# Patient Record
Sex: Male | Born: 1935 | Race: White | Hispanic: No | Marital: Married | Smoking: Never smoker
Health system: Southern US, Community
[De-identification: ages and names within clinical notes are randomized; demographics above are authoritative.]

## PROBLEM LIST (undated history)

## (undated) DIAGNOSIS — F329 Major depressive disorder, single episode, unspecified: Secondary | ICD-10-CM

## (undated) DIAGNOSIS — I959 Hypotension, unspecified: Secondary | ICD-10-CM

## (undated) DIAGNOSIS — M6281 Muscle weakness (generalized): Secondary | ICD-10-CM

## (undated) DIAGNOSIS — N183 Chronic kidney disease, stage 3 unspecified: Secondary | ICD-10-CM

## (undated) DIAGNOSIS — K859 Acute pancreatitis without necrosis or infection, unspecified: Secondary | ICD-10-CM

## (undated) DIAGNOSIS — I4891 Unspecified atrial fibrillation: Secondary | ICD-10-CM

## (undated) DIAGNOSIS — F32A Depression, unspecified: Secondary | ICD-10-CM

## (undated) DIAGNOSIS — F0390 Unspecified dementia without behavioral disturbance: Secondary | ICD-10-CM

## (undated) HISTORY — PX: ANKLE FUSION: SHX881

## (undated) HISTORY — DX: Unspecified atrial fibrillation: I48.91

## (undated) HISTORY — PX: APPENDECTOMY: SHX54

---
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
## 1998-01-31 ENCOUNTER — Ambulatory Visit (HOSPITAL_COMMUNITY): Admission: RE | Admit: 1998-01-31 | Discharge: 1998-01-31 | Payer: Self-pay | Admitting: Gastroenterology

## 2000-08-17 HISTORY — PX: KNEE ARTHROSCOPY: SHX127

## 2001-04-04 ENCOUNTER — Ambulatory Visit (HOSPITAL_COMMUNITY): Admission: RE | Admit: 2001-04-04 | Discharge: 2001-04-04 | Payer: Self-pay | Admitting: Gastroenterology

## 2002-10-06 ENCOUNTER — Encounter: Payer: Self-pay | Admitting: Orthopedic Surgery

## 2002-10-11 ENCOUNTER — Encounter: Payer: Self-pay | Admitting: Orthopedic Surgery

## 2002-10-11 ENCOUNTER — Inpatient Hospital Stay (HOSPITAL_COMMUNITY): Admission: RE | Admit: 2002-10-11 | Discharge: 2002-10-16 | Payer: Self-pay | Admitting: Orthopedic Surgery

## 2005-03-05 ENCOUNTER — Encounter: Admission: RE | Admit: 2005-03-05 | Discharge: 2005-03-05 | Payer: Self-pay | Admitting: Orthopedic Surgery

## 2005-03-12 ENCOUNTER — Ambulatory Visit (HOSPITAL_COMMUNITY): Admission: RE | Admit: 2005-03-12 | Discharge: 2005-03-12 | Payer: Self-pay | Admitting: Orthopedic Surgery

## 2005-03-12 ENCOUNTER — Ambulatory Visit (HOSPITAL_BASED_OUTPATIENT_CLINIC_OR_DEPARTMENT_OTHER): Admission: RE | Admit: 2005-03-12 | Discharge: 2005-03-12 | Payer: Self-pay | Admitting: Orthopedic Surgery

## 2007-08-18 HISTORY — PX: TOTAL KNEE ARTHROPLASTY: SHX125

## 2011-06-21 ENCOUNTER — Emergency Department (HOSPITAL_BASED_OUTPATIENT_CLINIC_OR_DEPARTMENT_OTHER)
Admission: EM | Admit: 2011-06-21 | Discharge: 2011-06-21 | Disposition: A | Discharge status: Home / Self Care | Payer: Medicare Other | Attending: Emergency Medicine | Admitting: Emergency Medicine

## 2011-06-21 ENCOUNTER — Encounter: Payer: Self-pay | Admitting: Emergency Medicine

## 2011-06-21 ENCOUNTER — Emergency Department (INDEPENDENT_AMBULATORY_CARE_PROVIDER_SITE_OTHER): Payer: Medicare Other

## 2011-06-21 DIAGNOSIS — S20219A Contusion of unspecified front wall of thorax, initial encounter: Secondary | ICD-10-CM | POA: Insufficient documentation

## 2011-06-21 DIAGNOSIS — X58XXXA Exposure to other specified factors, initial encounter: Secondary | ICD-10-CM | POA: Insufficient documentation

## 2011-06-21 DIAGNOSIS — R079 Chest pain, unspecified: Secondary | ICD-10-CM | POA: Insufficient documentation

## 2011-06-21 DIAGNOSIS — R0789 Other chest pain: Secondary | ICD-10-CM

## 2011-06-21 NOTE — ED Provider Notes (Signed)
History     CSN: [id number] Arrival date & time: 06/21/2011 12:09 PM   None     Chief Complaint  Patient presents with  . Rib Injury    (Consider location/radiation/quality/duration/timing/severity/associated sxs/prior treatment) HPI Comments: Patient was leaning into a tub to repair the drain 2 days ago. As he pushed himself out of the tub, he heard something pop in his right anterior chest. Since then he has noted pain in the right anterior chest. Worse with a deep breath or when lying down. He has had no prior evaluation for this. He took no medication for this.  Patient is a 77 y.o. male presenting with chest pain.  Chest Pain The chest pain began 2 days ago. Chest pain occurs intermittently. The chest pain is unchanged. The pain is associated with breathing. At its most intense, the pain is at 5/10. The pain is currently at 5/10. The severity of the pain is moderate. The quality of the pain is described as sharp. The pain does not radiate. Chest pain is worsened by deep breathing. He tried nothing for the symptoms. Risk factors include male gender and being elderly.     History reviewed. No pertinent past medical history.  Past Surgical History  Procedure Date  . Joint replacement     knee    History reviewed. No pertinent family history.  History  Substance Use Topics  . Smoking status: Never Smoker   . Smokeless tobacco: Not on file  . Alcohol Use: 1.2 oz/week    2 Shots of liquor per week      Review of Systems  Constitutional: Negative.   HENT: Negative.   Eyes: Negative.   Respiratory: Negative.   Cardiovascular: Positive for chest pain.  Gastrointestinal: Negative.   Genitourinary: Negative.   Musculoskeletal: Negative.   Skin: Negative.   Neurological: Negative.   Psychiatric/Behavioral: Negative.     Allergies  Review of patient's allergies indicates no known allergies.  Home Medications   Current Outpatient Rx  Name Route Sig Dispense Refill   . MULTIVITAMINS PO CAPS Oral Take 1 capsule by mouth daily.        BP 128/72  Pulse 52  Temp(Src) 97.5 F (36.4 C) (Oral)  Resp 18  Ht 6' (1.829 m)  Wt 205 lb (92.987 kg)  BMI 27.80 kg/m2  SpO2 100%  Physical Exam  Nursing note and vitals reviewed. Constitutional: He is oriented to person, place, and time. He appears well-developed and well-nourished.       In mild-moderate distress with pain in the right anterior rib cage.  HENT:  Head: Normocephalic and atraumatic.  Eyes: EOM are normal. Pupils are equal, round, and reactive to light.  Neck: Normal range of motion. Neck supple.  Cardiovascular: Normal rate, regular rhythm and normal heart sounds.   Pulmonary/Chest: Effort normal and breath sounds normal. He exhibits tenderness.       He localizes pain to the right anterior chest wall at the costal margin.  There is no crepitance or subcutaneous emphysema.  Abdominal: Soft. Bowel sounds are normal. There is no tenderness.  Musculoskeletal: Normal range of motion.  Neurological: He is alert and oriented to person, place, and time.       No sensory or motor deficit.  Skin: Skin is warm and dry.  Psychiatric: He has a normal mood and affect. His behavior is normal.    ED Course  Procedures (including critical care time)  2:01 PM Pt seen -->> physical exam performed.  X-rays of chest and right ribs ordered. 2:01 PM X-rays were negative.  He did not want any prescription pain medicine.  Reassured and released.  1. Chest wall contusion        Carleene Cooper III, MD 06/21/11 1401

## 2011-06-21 NOTE — ED Notes (Signed)
Right rib pain since Friday.  Pt slipped an caught his right rib in tub.  No head injury or head injury.

## 2011-06-21 NOTE — ED Provider Notes (Signed)
History     CSN: [id number] Arrival date & time: 06/21/2011 12:09 PM   None     Chief Complaint  Patient presents with  . Rib Injury    (Consider location/radiation/quality/duration/timing/severity/associated sxs/prior treatment) Patient is a 77 y.o. male presenting with chest pain. The history is provided by the patient. No language interpreter was used.  Chest Pain The chest pain began 2 days ago. Duration of episode(s) is 2 days. Chest pain occurs constantly. The chest pain is unchanged. At its most intense, the pain is at 6/10. The pain is currently at 6/10. The quality of the pain is described as aching. The pain does not radiate. Chest pain is worsened by deep breathing. Pertinent negatives for primary symptoms include no cough. He tried nothing for the symptoms. Risk factors include no known risk factors.   Pt fell in tub and hit right ribs  History reviewed. No pertinent past medical history.  Past Surgical History  Procedure Date  . Joint replacement     knee    History reviewed. No pertinent family history.  History  Substance Use Topics  . Smoking status: Never Smoker   . Smokeless tobacco: Not on file  . Alcohol Use: 1.2 oz/week    2 Shots of liquor per week      Review of Systems  Respiratory: Negative for cough.   Cardiovascular: Positive for chest pain.    Allergies  Review of patient's allergies indicates no known allergies.  Home Medications   Current Outpatient Rx  Name Route Sig Dispense Refill  . MULTIVITAMINS PO CAPS Oral Take 1 capsule by mouth daily.        BP 128/72  Pulse 52  Temp(Src) 97.5 F (36.4 C) (Oral)  Resp 18  Ht 6' (1.829 m)  Wt 205 lb (92.987 kg)  BMI 27.80 kg/m2  SpO2 100%  Physical Exam  Nursing note and vitals reviewed. Constitutional: He is oriented to person, place, and time. He appears well-developed and well-nourished.  HENT:  Head: Normocephalic and atraumatic.  Right Ear: External ear normal.  Left  Ear: External ear normal.  Nose: Nose normal.  Mouth/Throat: Oropharynx is clear and moist.  Eyes: Pupils are equal, round, and reactive to light.  Neck: Normal range of motion.  Cardiovascular: Normal rate and normal heart sounds.   Abdominal: Soft. There is no tenderness.  Musculoskeletal: Normal range of motion.  Neurological: He is alert and oriented to person, place, and time.  Skin: Skin is warm.  Psychiatric: He has a normal mood and affect.    ED Course  Procedures (including critical care time)  Labs Reviewed - No data to display No results found. Attending note: Pt hurt his chest wall two days ago after he was cleaning a tub drain and was pushing himself up over the side of the tub.  Exam shows no point tenderness, crepitance or subcutaneous emphysema.  Lungs are clear.  X-rays of chest and ribs  Are negative. Carleene Cooper III, M.D.   1. Chest wall contusion       MDM  Pt counseled on results  Medical screening examination/treatment/procedure(s) were conducted as a shared visit with non-physician practitioner(s) and myself.  I personally evaluated the patient during the encounter Carleene Cooper III M.Haskell Riling, PA 06/24/11 1512  Carleene Cooper III, MD 06/24/11 2055

## 2012-03-19 ENCOUNTER — Encounter (HOSPITAL_BASED_OUTPATIENT_CLINIC_OR_DEPARTMENT_OTHER): Payer: Self-pay | Admitting: Emergency Medicine

## 2012-03-19 ENCOUNTER — Emergency Department (HOSPITAL_BASED_OUTPATIENT_CLINIC_OR_DEPARTMENT_OTHER): Payer: Medicare Other

## 2012-03-19 ENCOUNTER — Inpatient Hospital Stay (HOSPITAL_BASED_OUTPATIENT_CLINIC_OR_DEPARTMENT_OTHER)
Admission: EM | Admit: 2012-03-19 | Discharge: 2012-03-21 | DRG: 439 | Disposition: A | Discharge status: Home / Self Care | Payer: Medicare Other | Attending: Internal Medicine | Admitting: Internal Medicine

## 2012-03-19 DIAGNOSIS — R001 Bradycardia, unspecified: Secondary | ICD-10-CM

## 2012-03-19 DIAGNOSIS — Z7982 Long term (current) use of aspirin: Secondary | ICD-10-CM

## 2012-03-19 DIAGNOSIS — I10 Essential (primary) hypertension: Secondary | ICD-10-CM | POA: Diagnosis present

## 2012-03-19 DIAGNOSIS — K5289 Other specified noninfective gastroenteritis and colitis: Secondary | ICD-10-CM | POA: Diagnosis present

## 2012-03-19 DIAGNOSIS — K529 Noninfective gastroenteritis and colitis, unspecified: Secondary | ICD-10-CM

## 2012-03-19 DIAGNOSIS — Z79899 Other long term (current) drug therapy: Secondary | ICD-10-CM

## 2012-03-19 DIAGNOSIS — I498 Other specified cardiac arrhythmias: Secondary | ICD-10-CM | POA: Diagnosis present

## 2012-03-19 DIAGNOSIS — D72829 Elevated white blood cell count, unspecified: Secondary | ICD-10-CM

## 2012-03-19 DIAGNOSIS — K859 Acute pancreatitis without necrosis or infection, unspecified: Principal | ICD-10-CM | POA: Diagnosis present

## 2012-03-19 LAB — CBC WITH DIFFERENTIAL/PLATELET
Basophils Relative: 0 % (ref 0–1)
Eosinophils Relative: 0 % (ref 0–5)
HCT: 40.7 % (ref 39.0–52.0)
Hemoglobin: 13.9 g/dL (ref 13.0–17.0)
Lymphocytes Relative: 4 % — ABNORMAL LOW (ref 12–46)
MCHC: 34.2 g/dL (ref 30.0–36.0)
MCV: 92.9 fL (ref 78.0–100.0)
Monocytes Absolute: 0.5 10*3/uL (ref 0.1–1.0)
Monocytes Relative: 4 % (ref 3–12)
Neutro Abs: 12.3 10*3/uL — ABNORMAL HIGH (ref 1.7–7.7)

## 2012-03-19 LAB — TROPONIN I: Troponin I: <0.3 ng/mL (ref <0.30)

## 2012-03-19 LAB — COMPREHENSIVE METABOLIC PANEL
ALT: 13 U/L (ref 0–53)
AST: 27 U/L (ref 0–37)
Albumin: 4.1 g/dL (ref 3.5–5.2)
Alkaline Phosphatase: 92 U/L (ref 39–117)
Calcium: 9.9 mg/dL (ref 8.4–10.5)
GFR calc Af Amer: 73 mL/min — ABNORMAL LOW (ref >90)
Glucose, Bld: 156 mg/dL — ABNORMAL HIGH (ref 70–99)
Potassium: 4.2 mEq/L (ref 3.5–5.1)
Sodium: 142 mEq/L (ref 135–145)
Total Protein: 7.2 g/dL (ref 6.0–8.3)

## 2012-03-19 LAB — URINALYSIS, ROUTINE W REFLEX MICROSCOPIC
Bilirubin Urine: NEGATIVE
Hgb urine dipstick: NEGATIVE
Ketones, ur: NEGATIVE mg/dL
Specific Gravity, Urine: 1.023 (ref 1.005–1.030)
pH: 6 (ref 5.0–8.0)

## 2012-03-19 LAB — URINE MICROSCOPIC-ADD ON

## 2012-03-19 MED ORDER — SODIUM CHLORIDE 0.9 % IV SOLN
INTRAVENOUS | Status: AC
  Administered 2012-03-19: 21:00:00 via INTRAVENOUS

## 2012-03-19 MED ORDER — HYDROMORPHONE HCL PF 1 MG/ML IJ SOLN
1.0000 mg | Freq: Once | INTRAMUSCULAR | Status: AC
  Administered 2012-03-19: 1 mg via INTRAVENOUS
  Filled 2012-03-19: qty 1

## 2012-03-19 MED ORDER — ONDANSETRON HCL 4 MG/2ML IJ SOLN
4.0000 mg | Freq: Once | INTRAMUSCULAR | Status: AC
  Administered 2012-03-19: 4 mg via INTRAVENOUS
  Filled 2012-03-19: qty 2

## 2012-03-19 MED ORDER — IOHEXOL 300 MG/ML  SOLN
20.0000 mL | INTRAMUSCULAR | Status: AC
  Administered 2012-03-19: 20 mL via ORAL

## 2012-03-19 MED ORDER — IOHEXOL 300 MG/ML  SOLN
100.0000 mL | Freq: Once | INTRAMUSCULAR | Status: AC | PRN
  Administered 2012-03-19: 100 mL via INTRAVENOUS

## 2012-03-19 MED ORDER — HYDROMORPHONE HCL PF 1 MG/ML IJ SOLN: 1.0000 mg | INTRAMUSCULAR | Status: DC | PRN

## 2012-03-19 MED ORDER — ONDANSETRON HCL 4 MG/2ML IJ SOLN: 4.0000 mg | Freq: Three times a day (TID) | INTRAMUSCULAR | Status: DC | PRN

## 2012-03-19 NOTE — ED Notes (Signed)
Pt vomited after drinking contrast- CT & MD notified

## 2012-03-19 NOTE — ED Provider Notes (Signed)
History   This chart was scribed for Hilario Quarry, MD scribed by Magnus Sinning. The patient was seen in room MH11/MH11 seen at 15:10    CSN: [id number]  Arrival date & time 03/19/12  1427   First MD Initiated Contact with Patient 03/19/12 1508      Chief Complaint  Patient presents with  . Abdominal Pain  . Bradycardia    (Consider location/radiation/quality/duration/timing/severity/associated sxs/prior treatment) HPI Cole Wilson is a 78 y.o. male BIB EMS who presents to the Emergency Department complaining of gradually worsening moderate dull abd pain, onset this morning after eating sausage biscuits for breakfast. Patient states the abd pain is located under his rib cage and across his entire abd. Patient also reports that the pain began approximately 3 hours after eating breakfast and he says he has not eaten since this morning. Denies emesis, urinary sxs, changes in bowel movements, diarrhea, nausea, back pain, use of tobacco, hx of similar abd pain, or hx of cholecystectomy. He currently rates pain a 8/10 with EDMD palpation.  Says he's currently taking an abx for his toe nail that was removed, but says he is otherwise healthy. Patient also takes vitamins daily and does state he does occasionally consume about 3 alcoholic beverages weekly.   PCP: Dr. Lupe Carney Podiatrist: [facility]  History reviewed. No pertinent past medical history.  Past Surgical History  Procedure Date  . Joint replacement     knee    No family history on file.  History  Substance Use Topics  . Smoking status: Never Smoker   . Smokeless tobacco: Not on file  . Alcohol Use: 1.2 oz/week    2 Shots of liquor per week     Review of Systems  All other systems reviewed and are negative.   10 Systems reviewed and are negative for acute change except as noted in the HPI. Allergies  Review of patient's allergies indicates no known allergies.  Home Medications   Current  Outpatient Rx  Name Route Sig Dispense Refill  . MULTIVITAMINS PO CAPS Oral Take 1 capsule by mouth daily.        BP 143/69  Resp 16  SpO2 99%  Physical Exam  Nursing note and vitals reviewed. Constitutional: He is oriented to person, place, and time. He appears well-developed and well-nourished. No distress.  HENT:  Head: Normocephalic and atraumatic.  Eyes: Conjunctivae and EOM are normal.  Neck: Neck supple. No tracheal deviation present.  Cardiovascular: Normal rate, regular rhythm and normal heart sounds.   No murmur heard. Pulmonary/Chest: Effort normal. No respiratory distress. He has no wheezes. He has no rales.  Abdominal: Soft. Bowel sounds are normal. He exhibits no distension. There is tenderness.       Diffusely tender across bilateral quadrants and at epigastrium.  Musculoskeletal: Normal range of motion. He exhibits no edema.       2+ DP pulses in both left and right foot. Well healing left great toe  Neurological: He is alert and oriented to person, place, and time. No sensory deficit.  Skin: Skin is warm and dry.  Psychiatric: He has a normal mood and affect. His behavior is normal.    ED Course  Procedures (including critical care time) DIAGNOSTIC STUDIES: Oxygen Saturation is 99% on room air, normal by my interpretation.    COORDINATION OF CARE: Differential Diagnoses: Gall bladder, Gastritis, AAA,  and other abd prognoses.  15:19: EDMD provides intent to start with labs and ultra sound.  Labs Reviewed  COMPREHENSIVE METABOLIC PANEL - Abnormal; Notable for the following:    Glucose, Bld 156 (*)     GFR calc non Af Amer 63 (*)     GFR calc Af Amer 73 (*)     All other components within normal limits  CBC WITH DIFFERENTIAL - Abnormal; Notable for the following:    WBC 13.2 (*)     Neutrophils Relative 93 (*)     Neutro Abs 12.3 (*)     Lymphocytes Relative 4 (*)     Lymphs Abs 0.5 (*)     All other components within normal limits  URINALYSIS,  ROUTINE W REFLEX MICROSCOPIC - Abnormal; Notable for the following:    Protein, ur 30 (*)     All other components within normal limits  LIPASE, BLOOD - Abnormal; Notable for the following:    Lipase >3000 (*)     All other components within normal limits  URINE MICROSCOPIC-ADD ON - Abnormal; Notable for the following:    Squamous Epithelial / LPF FEW (*)     Bacteria, UA FEW (*)     Casts HYALINE CASTS (*)     All other components within normal limits  TROPONIN I   US Abdomen Complete  03/19/2012  *RADIOLOGY REPORT*  Clinical Data:  Right upper quadrant abdominal pain.  COMPLETE ABDOMINAL ULTRASOUND  Comparison:  None.  Findings:  Gallbladder:  No shadowing gallstones or echogenic sludge.  No gallbladder wall thickening or pericholecystic fluid.  No sonographic Murphy's sign according to the ultrasound technologist. Wall thickness is 3.2 mm, within normal limits.  Common bile duct:  Normal in caliber. No biliary ductal dilation. The maximal diameter is 5.7 mm, within normal limits.  Liver:  Poor definition of internal architecture suggests diffuse fatty infiltration.  No discrete lesions are evident.  IVC:  Appears normal.  Pancreas:  The pancreas is not well visualized secondary to body habitus.  Spleen:  Normal size and echotexture without focal parenchymal abnormality.  The maximal size is 8.8 cm, within normal limits.  Right Kidney:  No hydronephrosis.  Well-preserved cortex.  Normal size and parenchymal echotexture without focal abnormalities. The maximal length is 11.7 cm, within normal limits.  Left Kidney:  No hydronephrosis.  Well-preserved cortex.  Normal size and parenchymal echotexture without focal abnormalities. The maximal length is 11.4 cm, within normal limit.  Abdominal aorta:  The portions of the aorta are not well visualized due to patient body habitus.  IMPRESSION:  1.  Normal appearance of the gallbladder and common bile duct. 2.  Hepatic steatosis. 3.  No acute abnormality to  explain the patient's symptoms.  Original Report Authenticated By: Jamesetta Orleans. MATTERN, M.D.   US Abdomen Complete  Ct Abdomen Pelvis W Contrast  03/19/2012  *RADIOLOGY REPORT*  Clinical Data: 78 year old male with abdominal pain and nausea.  CT ABDOMEN AND PELVIS WITH CONTRAST  Technique:  Multidetector CT imaging of the abdomen and pelvis was performed following the standard protocol during bolus administration of intravenous contrast.  Contrast:  100 ml intravenous Omnipaque-300  Comparison: 03/19/2012 abdominal ultrasound.  Findings: The liver, spleen, pancreas, gallbladder and adrenal glands are unremarkable. Mild bilateral renal cortical atrophy is present.  There is mild stranding/inflammation within the mesentery and pericolic gutters - of uncertain chronicity.  A tiny amount of ascites is present.  The source of this stranding/inflammation is not identified.  There is no evidence of biliary dilatation, abdominal aortic aneurysm, enlarged lymph nodes or pneumoperitoneum. There is  no evidence of small bowel obstruction or bowel wall thickening.  Descending and sigmoid colonic diverticulosis noted without evidence of diverticulitis. The appendix is normal.  Prostate enlargement is present as well as a small left inguinal hernia containing fat. Bilateral common iliac artery aneurysms are present measuring 1.6 cm on the right 1.8 cm on the left.  No acute or suspicious bony abnormalities identified.  IMPRESSION: Mild stranding/inflammation in the mesentery and pericolic gutters of uncertain chronicity and uncertain origin.  Tiny amount of ascites.  Unremarkable bowel except for colonic diverticulosis.  Bilateral common iliac artery aneurysms.  Prostate enlargement.  Original Report Authenticated By: Rosendo Gros, M.D.   No diagnosis found.     Date: 03/19/2012  Rate:49  Rhythm: sinus bradycardia  QRS Axis: normal  Intervals: normal  ST/T Wave abnormalities: normal  Conduction Disutrbances:  none  Narrative Interpretation: unremarkable     MDM    Discussed  Abdomen ultrasound and plan for ct and and inpatient treatment.  Pain greatly improved after pain meds.   Discussed ct results with Dr. Si Gaul and feels c.w. Pancreatitis.    Hospitalist paged for admission.   Discussed with Dr. Mikeal Hawthorne and plan tele admission for pancreatitis and bradycardia.  HR has been normal since initial evaluation.    Hilario Quarry, MD 03/19/12 2041

## 2012-03-19 NOTE — ED Notes (Signed)
Ray MD at bedside to discuss admission with pt and family. Pt's wife is going home. Cole Wilson (wife) cell: [phone]

## 2012-03-19 NOTE — ED Notes (Addendum)
Pt c/o mid upper abd pain, started after eating a sausage biscuit this am, has progressively moved upward to epigastric area. Nausea, no vomiting; per EMS pt was diaphoretic upon their arrival (they report he was outside) & HR dropped to "30s" for a brief period.

## 2012-03-19 NOTE — ED Notes (Signed)
Phone report given to [facility]- attempt x 1 to call report to [phone]

## 2012-03-20 ENCOUNTER — Encounter (HOSPITAL_COMMUNITY): Payer: Self-pay | Admitting: Internal Medicine

## 2012-03-20 DIAGNOSIS — D72829 Elevated white blood cell count, unspecified: Secondary | ICD-10-CM

## 2012-03-20 LAB — COMPREHENSIVE METABOLIC PANEL
AST: 20 U/L (ref 0–37)
Albumin: 3.4 g/dL — ABNORMAL LOW (ref 3.5–5.2)
BUN: 18 mg/dL (ref 6–23)
Calcium: 9 mg/dL (ref 8.4–10.5)
Creatinine, Ser: 1.01 mg/dL (ref 0.50–1.35)
Total Protein: 6.5 g/dL (ref 6.0–8.3)

## 2012-03-20 LAB — CBC
HCT: 40.2 % (ref 39.0–52.0)
Hemoglobin: 13.4 g/dL (ref 13.0–17.0)
MCV: 93.7 fL (ref 78.0–100.0)
RBC: 4.29 MIL/uL (ref 4.22–5.81)
RDW: 13.6 % (ref 11.5–15.5)
WBC: 12.6 10*3/uL — ABNORMAL HIGH (ref 4.0–10.5)

## 2012-03-20 LAB — LIPID PANEL
Cholesterol: 175 mg/dL (ref 0–200)
Triglycerides: 46 mg/dL (ref <150)

## 2012-03-20 MED ORDER — HYDROMORPHONE HCL PF 1 MG/ML IJ SOLN: 0.5000 mg | INTRAMUSCULAR | Status: DC | PRN

## 2012-03-20 MED ORDER — ONDANSETRON HCL 4 MG/2ML IJ SOLN: 4.0000 mg | Freq: Four times a day (QID) | INTRAMUSCULAR | Status: DC | PRN

## 2012-03-20 MED ORDER — SODIUM CHLORIDE 0.9 % IV SOLN
INTRAVENOUS | Status: DC
  Administered 2012-03-20 (×3): via INTRAVENOUS

## 2012-03-20 MED ORDER — ENOXAPARIN SODIUM 40 MG/0.4ML ~~LOC~~ SOLN
40.0000 mg | SUBCUTANEOUS | Status: DC
  Administered 2012-03-20: 40 mg via SUBCUTANEOUS
  Filled 2012-03-20 (×2): qty 0.4

## 2012-03-20 MED ORDER — ZOLPIDEM TARTRATE 5 MG PO TABS: 5.0000 mg | ORAL_TABLET | Freq: Every evening | ORAL | Status: DC | PRN

## 2012-03-20 MED ORDER — ONDANSETRON HCL 4 MG PO TABS: 4.0000 mg | ORAL_TABLET | Freq: Four times a day (QID) | ORAL | Status: DC | PRN

## 2012-03-20 MED ORDER — LABETALOL HCL 5 MG/ML IV SOLN
5.0000 mg | INTRAVENOUS | Status: DC | PRN
  Filled 2012-03-20: qty 4

## 2012-03-20 MED ORDER — LABETALOL HCL 100 MG PO TABS
100.0000 mg | ORAL_TABLET | Freq: Two times a day (BID) | ORAL | Status: DC
  Administered 2012-03-20: 100 mg via ORAL
  Filled 2012-03-20 (×4): qty 1

## 2012-03-20 MED ORDER — CIPROFLOXACIN IN D5W 400 MG/200ML IV SOLN
400.0000 mg | Freq: Two times a day (BID) | INTRAVENOUS | Status: DC
  Administered 2012-03-20 – 2012-03-21 (×3): 400 mg via INTRAVENOUS
  Filled 2012-03-20 (×4): qty 200

## 2012-03-20 MED ORDER — METRONIDAZOLE IN NACL 5-0.79 MG/ML-% IV SOLN
500.0000 mg | Freq: Three times a day (TID) | INTRAVENOUS | Status: DC
  Administered 2012-03-20 – 2012-03-21 (×4): 500 mg via INTRAVENOUS
  Filled 2012-03-20 (×6): qty 100

## 2012-03-20 NOTE — H&P (Signed)
Cole Wilson is an 78 y.o. male.   Chief Complaint: Abdominal pain HPI: 78 year old gentleman with no significant past medical history except from possible cholesterol who went to [facility] tonight secondary to epigastric pain and bloating. Pain was 8/10 it side but now 1/10. Problem started a day earlier. His wife has been sick for a few weeks since they took a trip to the mountains. He has not been sick however until now. His initial evaluation treatment center hypertension old elevated lipase more than 3000 some leukocytosis and mild bradycardia with heart rates at some point drop into the 40s but currently stable at 60s. His CT abdomen and pelvis showed no evidence pancreatitis and also gallbladder seems okay. History reviewed. No pertinent past medical history.  Past Surgical History  Procedure Date  . Joint replacement     knee  . Other surgical history     fused right ankle    History reviewed. No pertinent family history. Social History:  reports that he has never smoked. He does not have any smokeless tobacco history on file. He reports that he drinks about 1.2 ounces of alcohol per week. His drug history not on file.  Allergies: No Known Allergies  Medications Prior to Admission  Medication Sig Dispense Refill  . Ascorbic Acid (VITAMIN C) 1000 MG tablet Take 1,000 mg by mouth daily.      [name] aspirin 81 MG tablet Take 81 mg by mouth daily.      . calcium carbonate (OS-CAL) 600 MG TABS Take 600 mg by mouth 2 (two) times daily with a meal.      . Glucosamine-Chondroit-Vit C-Mn (GLUCOSAMINE 1500 COMPLEX PO) Take 1 tablet by mouth daily.      . Multiple Vitamin (MULTIVITAMIN) capsule Take 1 capsule by mouth daily.        . Omega-3 1400 MG CAPS Take 1 capsule by mouth daily.      [name] terbinafine (LAMISIL) 250 MG tablet Take 250 mg by mouth daily.        Results for orders placed during the hospital encounter of 03/19/12 (from the past 48 hour(s))  COMPREHENSIVE  METABOLIC PANEL     Status: Abnormal   Collection Time   03/19/12  2:51 PM      Component Value Range Comment   Sodium 142  135 - 145 mEq/L    Potassium 4.2  3.5 - 5.1 mEq/L    Chloride 103  96 - 112 mEq/L    CO2 28  19 - 32 mEq/L    Glucose, Bld 156 (*) 70 - 99 mg/dL    BUN 23  6 - 23 mg/dL    Creatinine, Ser 1.61  0.50 - 1.35 mg/dL    Calcium 9.9  8.4 - 09.6 mg/dL    Total Protein 7.2  6.0 - 8.3 g/dL    Albumin 4.1  3.5 - 5.2 g/dL    AST 27  0 - 37 U/L    ALT 13  0 - 53 U/L    Alkaline Phosphatase 92  39 - 117 U/L    Total Bilirubin 0.4  0.3 - 1.2 mg/dL    GFR calc non Af Amer 63 (*) >90 mL/min    GFR calc Af Amer 73 (*) >90 mL/min   CBC WITH DIFFERENTIAL     Status: Abnormal   Collection Time   03/19/12  2:51 PM      Component Value Range Comment   WBC 13.2 (*) 4.0 -  10.5 K/uL    RBC 4.38  4.22 - 5.81 MIL/uL    Hemoglobin 13.9  13.0 - 17.0 g/dL    HCT 16.1  09.6 - 04.5 %    MCV 92.9  78.0 - 100.0 fL    MCH 31.7  26.0 - 34.0 pg    MCHC 34.2  30.0 - 36.0 g/dL    RDW 40.9  81.1 - 91.4 %    Platelets 169  150 - 400 K/uL    Neutrophils Relative 93 (*) 43 - 77 %    Neutro Abs 12.3 (*) 1.7 - 7.7 K/uL    Lymphocytes Relative 4 (*) 12 - 46 %    Lymphs Abs 0.5 (*) 0.7 - 4.0 K/uL    Monocytes Relative 4  3 - 12 %    Monocytes Absolute 0.5  0.1 - 1.0 K/uL    Eosinophils Relative 0  0 - 5 %    Eosinophils Absolute 0.0  0.0 - 0.7 K/uL    Basophils Relative 0  0 - 1 %    Basophils Absolute 0.0  0.0 - 0.1 K/uL   LIPASE, BLOOD     Status: Abnormal   Collection Time   03/19/12  3:21 PM      Component Value Range Comment   Lipase >3000 (*) 11 - 59 U/L   TROPONIN I     Status: Normal   Collection Time   03/19/12  3:21 PM      Component Value Range Comment   Troponin I <0.30  <0.30 ng/mL   URINALYSIS, ROUTINE W REFLEX MICROSCOPIC     Status: Abnormal   Collection Time   03/19/12  4:43 PM      Component Value Range Comment   Color, Urine YELLOW  YELLOW    APPearance CLEAR  CLEAR     Specific Gravity, Urine 1.023  1.005 - 1.030    pH 6.0  5.0 - 8.0    Glucose, UA NEGATIVE  NEGATIVE mg/dL    Hgb urine dipstick NEGATIVE  NEGATIVE    Bilirubin Urine NEGATIVE  NEGATIVE    Ketones, ur NEGATIVE  NEGATIVE mg/dL    Protein, ur 30 (*) NEGATIVE mg/dL    Urobilinogen, UA 0.2  0.0 - 1.0 mg/dL    Nitrite NEGATIVE  NEGATIVE    Leukocytes, UA NEGATIVE  NEGATIVE   URINE MICROSCOPIC-ADD ON     Status: Abnormal   Collection Time   03/19/12  4:43 PM      Component Value Range Comment   Squamous Epithelial / LPF FEW (*) RARE    WBC, UA 0-2  <3 WBC/hpf    Bacteria, UA FEW (*) RARE    Casts HYALINE CASTS (*) NEGATIVE    Urine-Other MUCOUS PRESENT      US Abdomen Complete  03/19/2012  *RADIOLOGY REPORT*  Clinical Data:  Right upper quadrant abdominal pain.  COMPLETE ABDOMINAL ULTRASOUND  Comparison:  None.  Findings:  Gallbladder:  No shadowing gallstones or echogenic sludge.  No gallbladder wall thickening or pericholecystic fluid.  No sonographic Murphy's sign according to the ultrasound technologist. Wall thickness is 3.2 mm, within normal limits.  Common bile duct:  Normal in caliber. No biliary ductal dilation. The maximal diameter is 5.7 mm, within normal limits.  Liver:  Poor definition of internal architecture suggests diffuse fatty infiltration.  No discrete lesions are evident.  IVC:  Appears normal.  Pancreas:  The pancreas is not well visualized secondary to body habitus.  Spleen:  Normal size and echotexture without focal parenchymal abnormality.  The maximal size is 8.8 cm, within normal limits.  Right Kidney:  No hydronephrosis.  Well-preserved cortex.  Normal size and parenchymal echotexture without focal abnormalities. The maximal length is 11.7 cm, within normal limits.  Left Kidney:  No hydronephrosis.  Well-preserved cortex.  Normal size and parenchymal echotexture without focal abnormalities. The maximal length is 11.4 cm, within normal limit.  Abdominal aorta:  The portions of  the aorta are not well visualized due to patient body habitus.  IMPRESSION:  1.  Normal appearance of the gallbladder and common bile duct. 2.  Hepatic steatosis. 3.  No acute abnormality to explain the patient's symptoms.  Original Report Authenticated By: Jamesetta Orleans. MATTERN, M.D.   Ct Abdomen Pelvis W Contrast  03/19/2012  *RADIOLOGY REPORT*  Clinical Data: 78 year old male with abdominal pain and nausea.  CT ABDOMEN AND PELVIS WITH CONTRAST  Technique:  Multidetector CT imaging of the abdomen and pelvis was performed following the standard protocol during bolus administration of intravenous contrast.  Contrast:  100 ml intravenous Omnipaque-300  Comparison: 03/19/2012 abdominal ultrasound.  Findings: The liver, spleen, pancreas, gallbladder and adrenal glands are unremarkable. Mild bilateral renal cortical atrophy is present.  There is mild stranding/inflammation within the mesentery and pericolic gutters - of uncertain chronicity.  A tiny amount of ascites is present.  The source of this stranding/inflammation is not identified.  There is no evidence of biliary dilatation, abdominal aortic aneurysm, enlarged lymph nodes or pneumoperitoneum. There is no evidence of small bowel obstruction or bowel wall thickening.  Descending and sigmoid colonic diverticulosis noted without evidence of diverticulitis. The appendix is normal.  Prostate enlargement is present as well as a small left inguinal hernia containing fat. Bilateral common iliac artery aneurysms are present measuring 1.6 cm on the right 1.8 cm on the left.  No acute or suspicious bony abnormalities identified.  IMPRESSION: Mild stranding/inflammation in the mesentery and pericolic gutters of uncertain chronicity and uncertain origin.  Tiny amount of ascites.  Unremarkable bowel except for colonic diverticulosis.  Bilateral common iliac artery aneurysms.  Prostate enlargement.  Original Report Authenticated By: Rosendo Gros, M.D.    Review of  Systems  HENT: Negative.   Eyes: Negative.   Respiratory: Negative.   Cardiovascular: Negative.   Gastrointestinal: Positive for nausea, vomiting, abdominal pain and diarrhea.  Genitourinary: Negative.   Musculoskeletal: Negative.   Skin: Negative.   Neurological: Positive for weakness.  Endo/Heme/Allergies: Negative.   Psychiatric/Behavioral: Negative.     Blood pressure 119/67, pulse 65, temperature 97.6 F (36.4 C), temperature source Oral, resp. rate 18, height 6' (1.829 m), weight 92.6 kg (204 lb 2.3 oz), SpO2 91.00%. Physical Exam  Constitutional: He is oriented to person, place, and time. He appears well-developed and well-nourished.  HENT:  Head: Normocephalic and atraumatic.  Right Ear: External ear normal.  Left Ear: External ear normal.  Nose: Nose normal.  Mouth/Throat: Oropharynx is clear and moist.  Eyes: Conjunctivae and EOM are normal. Pupils are equal, round, and reactive to light.  Neck: Normal range of motion. Neck supple.  Cardiovascular: Normal rate, regular rhythm, normal heart sounds and intact distal pulses.   Respiratory: Effort normal and breath sounds normal.  GI: He exhibits distension. He exhibits no mass. There is tenderness. There is no rebound and no guarding.  Musculoskeletal: Normal range of motion.  Neurological: He is alert and oriented to person, place, and time. He has normal reflexes.  Skin: Skin is  warm and dry.  Psychiatric: He has a normal mood and affect. His behavior is normal. Judgment and thought content normal.     Assessment/Plan A 78 year old gentleman presenting with symptoms of acute pancreatitis. Not a heavy drinker and no evidence of gallstones. No pancreatitis also on CT scan as described. The studies were consistent with some colitis from food poisoning although the period since his wife has been sick is weeks. She has yet to fully recover according to the patient. The elevation in lipase is however a lot. No evidence of  pancreatic tumor or mass.  Plan #1 acute pancreatitis: Patient will be admitted for bowel rest hydration, pain control, and nausea control. He does not have much pain now. The Ranson's criteria also indicates mild pancreatitis despite high Lipase. Will follow his lipase levels and symptoms until resolution.  #2 Leukocytosis: Probably from the colitis versus pancreatitis follow white count closely.  #3 possible colitis: Empirically start him on Cipro and Flagyl.  #4 transient bradycardia: This seems to have resolved. The patient has had fever with his current symptoms of possible colitis as well as bradycardia suspicion for typhoid fever will have been high. Continue to monitor him now on telemetry.  GARBA,LAWAL 03/20/2012, 5:41 AM

## 2012-03-20 NOTE — Progress Notes (Signed)
Patient ID: Denyce Robert, male   DOB: 1932-08-01, 78 y.o.   MRN: [id number]  [facility] PROGRESS NOTE  AYINDE SWIM FAO:[id number] DOB: 1931-11-20 DOA: 03/19/2012 PCP: No primary provider on file.  Brief narrative: Pt is 78 yo male admitted 08/03 for abdominal pain and being worked up for acute pancreatitis.   Principal Problem:  *Pancreatitis - unclear etiology and possibly alcohol induced as pt does admit to drinking approximately 2 small cups/day but not every day - no evidence of gallstones on imaging studies - lipase is trending down and pt is tolerating current diet - will continue IVF and supportive care with analgesia and antiemetics as needed - follow up on lipid panel as well  Active Problems:  Leucocytosis - likely secondary to acute pancreatitis - I am not clear that antibiotics are necessary at this time - will plan on discontinuing in AM if no clear source of infection noted   HTN, accelerated - will initiate regimen and will monitor vitals per floor protocol   Consultants:  None  Procedures/Studies: US Abdomen Complete 03/19/2012   IMPRESSION:   1.  Normal appearance of the gallbladder and common bile duct.  2.  Hepatic steatosis.  3.  No acute abnormality to explain the patient's symptoms.     Ct Abdomen Pelvis W Contrast 03/19/2012  IMPRESSION: Mild stranding/inflammation in the mesentery and pericolic gutters of uncertain chronicity and uncertain origin.   Tiny amount of ascites.  Unremarkable bowel except for colonic diverticulosis.  Bilateral common iliac artery aneurysms.   Prostate enlargement.    Antibiotics:  Cipro 03/19/2012 -->  Flagyl 03/19/2012 -->  Code Status: Full Family Communication: Pt at bedside Disposition Plan: Home when medically stable  HPI/Subjective: No events overnight.   Objective: Filed Vitals:   03/19/12 2119 03/19/12 2219 03/19/12 2322 03/20/12 0519  BP: 174/75 145/61 165/83 119/67  Pulse: 60  65 63 65  Temp:   98.2 F (36.8 C) 97.6 F (36.4 C)  TempSrc:   Oral Oral  Resp:  20  18  Height:   6' (1.829 m)   Weight:   204 lb 2.3 oz (92.6 kg)   SpO2: 96% 94% 95% 91%    Intake/Output Summary (Last 24 hours) at 03/20/12 1321 Last data filed at 03/20/12 0700  Gross per 24 hour  Intake  787.5 ml  Output    700 ml  Net   87.5 ml    Exam:   General:  Pt is alert, follows commands appropriately, not in acute distress  Cardiovascular: Regular rate and rhythm, S1/S2, no murmurs, no rubs, no gallops  Respiratory: Clear to auscultation bilaterally, no wheezing, no crackles, no rhonchi  Abdomen: Soft, non tender, non distended, bowel sounds present, no guarding  Extremities: No edema, pulses DP and PT palpable bilaterally  Neuro: Grossly nonfocal  Data Reviewed: Basic Metabolic Panel:  Lab 03/20/12 6962 03/19/12 1451  NA 139 142  K 4.0 4.2  CL 101 103  CO2 27 28  GLUCOSE 123* 156*  BUN 18 23  CREATININE 1.01 1.10  CALCIUM 9.0 9.9  MG -- --  PHOS -- --   Liver Function Tests:  Lab 03/20/12 0545 03/19/12 1451  AST 20 27  ALT 12 13  ALKPHOS 75 92  BILITOT 0.5 0.4  PROT 6.5 7.2  ALBUMIN 3.4* 4.1    Lab 03/20/12 0545 03/19/12 1521  LIPASE 1739* >3000*  AMYLASE -- --   CBC:  Lab 03/20/12 0545 03/19/12 1451  WBC 12.6*  13.2*  NEUTROABS -- 12.3*  HGB 13.4 13.9  HCT 40.2 40.7  MCV 93.7 92.9  PLT 154 169   Cardiac Enzymes:  Lab 03/19/12 1521  CKTOTAL --  CKMB --  CKMBINDEX --  TROPONINI <0.30     Scheduled Meds:   . ciprofloxacin  400 mg Intravenous Q12H  . enoxaparin injection  40 mg Subcutaneous Q24H  .  HYDROmorphone (DILAUDID) inj  1 mg Intravenous Once  . iohexol  20 mL Oral Q1 Hr x 2  . metronidazole  500 mg Intravenous Q8H  . ondansetron (ZOFRAN) IV  4 mg Intravenous Once   Continuous Infusions:   . sodium chloride 150 mL/hr at 03/20/12 1610     Debbora Presto, MD  [facility] Pager [phone]  If  7PM-7AM, please contact night-coverage [email or web address] Password [email or web address] 03/20/2012, 1:21 PM   LOS: 1 day

## 2012-03-20 NOTE — Progress Notes (Signed)
Pt states he has 2 drinks per day and has for the past 2 years.

## 2012-03-20 NOTE — Progress Notes (Signed)
Patient arrived to 5500 from [facility] via [facility].  He was alert and oriented times four and oriented to unit, room, call bell system, safety measures to ensure decreased fall risk, and acknowledged understanding of all information.  He also watched the safety video.  VS stable and denies pain.  Admitting MD notified.  POC: pain control, hydration, IV administration, and NPO diet for bowel rest. Will continue to monitor patient.

## 2012-03-21 DIAGNOSIS — I498 Other specified cardiac arrhythmias: Secondary | ICD-10-CM

## 2012-03-21 LAB — COMPREHENSIVE METABOLIC PANEL
ALT: 10 U/L (ref 0–53)
AST: 24 U/L (ref 0–37)
CO2: 24 mEq/L (ref 19–32)
Calcium: 8.2 mg/dL — ABNORMAL LOW (ref 8.4–10.5)
Chloride: 102 mEq/L (ref 96–112)
GFR calc Af Amer: 59 mL/min — ABNORMAL LOW (ref >90)
GFR calc non Af Amer: 51 mL/min — ABNORMAL LOW (ref >90)
Glucose, Bld: 121 mg/dL — ABNORMAL HIGH (ref 70–99)
Sodium: 137 mEq/L (ref 135–145)
Total Bilirubin: 0.5 mg/dL (ref 0.3–1.2)

## 2012-03-21 LAB — CBC
Hemoglobin: 11.6 g/dL — ABNORMAL LOW (ref 13.0–17.0)
MCH: 32 pg (ref 26.0–34.0)
MCV: 94.5 fL (ref 78.0–100.0)
RBC: 3.62 MIL/uL — ABNORMAL LOW (ref 4.22–5.81)
WBC: 10.1 10*3/uL (ref 4.0–10.5)

## 2012-03-21 MED ORDER — CIPROFLOXACIN HCL 500 MG PO TABS: 500.0000 mg | ORAL_TABLET | Freq: Two times a day (BID) | ORAL | Status: AC

## 2012-03-21 MED ORDER — METRONIDAZOLE 500 MG PO TABS: 500.0000 mg | ORAL_TABLET | Freq: Three times a day (TID) | ORAL | Status: AC

## 2012-03-21 MED ORDER — LORAZEPAM 2 MG/ML IJ SOLN
1.0000 mg | Freq: Four times a day (QID) | INTRAMUSCULAR | Status: DC | PRN
  Administered 2012-03-21: 1 mg via INTRAVENOUS
  Filled 2012-03-21: qty 1

## 2012-03-21 NOTE — Progress Notes (Signed)
Patient increasingly confused ie. forgetting that he is in hospital, uncooperative, and ignoring safety measures with care compared to when admitted. MD notified about patient's agitation.  PRN IV ativan ordered.  Will continue to monitor patient progress.

## 2012-03-21 NOTE — Progress Notes (Signed)
MD, the patient has not had any alcohol since Friday and normally has 2 shots a day.  He has become increasingly confused and agitated beginning yesterday.  The IV ativan prn that was ordered this am seemed to help patient a great deal. Please consider ordering CIWA protocol for this patient.  Thanks

## 2012-03-21 NOTE — Discharge Summary (Signed)
Physician Discharge Summary  Cole Wilson:[id number] DOB: 09-Dec-1931 DOA: 03/19/2012  PCP: No primary provider on file.  Admit date: 03/19/2012 Discharge date: 03/21/2012  Recommendations for Outpatient Follow-up:  1. Pt will need to follow up with PCP in 2-3 weeks post discharge 2. Please obtain BMP to evaluate electrolytes and kidney function, also check potassium level, pt was given K-dur 40 mew prior to discharge  3. Please also check lipase level to ensure that is has return to normal limits 4. Please also check CBC to evaluate Hg and Hct levels 5. Please check BP and reassess if antihypertensive therapy indicated  Discharge Diagnoses: Abdominal pain secondary to pancreatitis  Principal Problem:  *Pancreatitis Active Problems:  Leucocytosis  CT results suggests inflammation of colon  Bradycardia  Discharge Condition: Stable  Diet recommendation: Heart healthy diet discussed in details   Brief narrative:  Pt is 78 yo male admitted 08/03 for abdominal pain and being worked up for acute pancreatitis.   Principal Problem:  *Pancreatitis  - unclear etiology and possibly alcohol induced as pt does admit to drinking approximately 2 small cups/day but not every day  - no evidence of gallstones on imaging studies  - lipase is trending down and pt is tolerating current diet  - we continued IVF and supportive care with analgesia and antiemetics as needed  - lipid panel within normal limits  Active Problems:  Leucocytosis  - likely secondary to acute pancreatitis in the setting of acute colitis - pt will continue Cipro and Flagyl in an outpt setting  HTN, accelerated  - we monitored vitals per floor protocol  - will defer to PCP to  Add antihypertensive if indicated  Consultants:  None  Procedures/Studies:  US Abdomen Complete 03/19/2012  IMPRESSION:  1. Normal appearance of the gallbladder and common bile duct.  2. Hepatic steatosis.  3. No acute abnormality to  explain the patient's symptoms.   Ct Abdomen Pelvis W Contrast 03/19/2012  IMPRESSION:  Mild stranding/inflammation in the mesentery and pericolic gutters of uncertain chronicity and uncertain origin.  Tiny amount of ascites. Unremarkable bowel except for colonic diverticulosis. Bilateral common iliac artery aneurysms.  Prostate enlargement.   Antibiotics:  Cipro 03/19/2012 -->  Flagyl 03/19/2012 -->  Discharge Exam: Filed Vitals:   03/21/12 0544  BP: 100/54  Pulse: 62  Temp: 98.5 F (36.9 C)  Resp: 18   Filed Vitals:   03/20/12 1500 03/20/12 1541 03/20/12 2150 03/21/12 0544  BP: 99/61 160/84 97/51 100/54  Pulse: 64 78 67 62  Temp: 97.6 F (36.4 C)  98.8 F (37.1 C) 98.5 F (36.9 C)  TempSrc: Oral  Oral Oral  Resp: 18  18 18   Height:      Weight:      SpO2: 94%  91% 93%    General: Pt is alert, follows commands appropriately, not in acute distress Cardiovascular: Regular rate and rhythm, S1/S2 +, no murmurs, no rubs, no gallops Respiratory: Clear to auscultation bilaterally, no wheezing, no crackles, no rhonchi Abdominal: Soft, non tender, non distended, bowel sounds +, no guarding Extremities: no edema, no cyanosis, pulses palpable bilaterally DP and PT Neuro: Grossly nonfocal  Discharge Instructions  Discharge Orders    Future Orders Please Complete By Expires   Diet - low sodium heart healthy      Increase activity slowly        Medication List  As of 03/21/2012 10:14 AM   TAKE these medications  aspirin 81 MG tablet   Take 81 mg by mouth daily.      calcium carbonate 600 MG Tabs   Commonly known as: OS-CAL   Take 600 mg by mouth 2 (two) times daily with a meal.      ciprofloxacin 500 MG tablet   Commonly known as: CIPRO   Take 1 tablet (500 mg total) by mouth 2 (two) times daily.      GLUCOSAMINE 1500 COMPLEX PO   Take 1 tablet by mouth daily.      metroNIDAZOLE 500 MG tablet   Commonly known as: FLAGYL   Take 1 tablet (500 mg total) by  mouth 3 (three) times daily.      multivitamin capsule   Take 1 capsule by mouth daily.      Omega-3 1400 MG Caps   Take 1 capsule by mouth daily.      terbinafine 250 MG tablet   Commonly known as: LAMISIL   Take 250 mg by mouth daily.      vitamin C 1000 MG tablet   Take 1,000 mg by mouth daily.           Follow-up Information    Please follow up. (As needed if symptoms worsen)           The results of significant diagnostics from this hospitalization (including imaging, microbiology, ancillary and laboratory) are listed below for reference.     Microbiology: No results found for this or any previous visit (from the past 240 hour(s)).   Labs: Basic Metabolic Panel:  Lab 03/21/12 4098 03/20/12 0545 03/19/12 1451  NA 137 139 142  K 3.3* 4.0 4.2  CL 102 101 103  CO2 24 27 28   GLUCOSE 121* 123* 156*  BUN 24* 18 23  CREATININE 1.30 1.01 1.10  CALCIUM 8.2* 9.0 9.9  MG -- -- --  PHOS -- -- --   Liver Function Tests:  Lab 03/21/12 0500 03/20/12 0545 03/19/12 1451  AST 24 20 27   ALT 10 12 13   ALKPHOS 64 75 92  BILITOT 0.5 0.5 0.4  PROT 5.8* 6.5 7.2  ALBUMIN 2.9* 3.4* 4.1    Lab 03/21/12 0500 03/20/12 0545 03/19/12 1521  LIPASE 232* 1739* >3000*  AMYLASE -- -- --   CBC:  Lab 03/21/12 0500 03/20/12 0545 03/19/12 1451  WBC 10.1 12.6* 13.2*  NEUTROABS -- -- 12.3*  HGB 11.6* 13.4 13.9  HCT 34.2* 40.2 40.7  MCV 94.5 93.7 92.9  PLT 129* 154 169   Cardiac Enzymes:  Lab 03/19/12 1521  CKTOTAL --  CKMB --  CKMBINDEX --  TROPONINI <0.30   SIGNED: Time coordinating discharge: Over 30 minutes  Debbora Presto, MD  [facility] 03/21/2012, 10:14 AM Pager [phone]  If 7PM-7AM, please contact night-coverage [email or web address] Password [email or web address]

## 2012-03-22 NOTE — Care Management Note (Signed)
    Page 1 of 1   03/22/2012     8:34:43 PM   CARE MANAGEMENT NOTE 03/22/2012  Patient:  Cole Wilson, Cole Wilson   Account Number:  [id number]  Date Initiated:  03/22/2012  Documentation initiated by:  Letha Cape  Subjective/Objective Assessment:   dx pancreatitis  admit     Action/Plan:   Anticipated DC Date:  03/21/2012   Anticipated DC Plan:  HOME/SELF CARE      DC Planning Services  CM consult      Choice offered to / List presented to:             Status of service:  Completed, signed off Medicare Important Message given?   (If response is "NO", the following Medicare IM given date fields will be blank) Date Medicare IM given:   Date Additional Medicare IM given:    Discharge Disposition:  HOME/SELF CARE  Per UR Regulation:  Reviewed for med. necessity/level of care/duration of stay  If discussed at Long Length of Stay Meetings, dates discussed:    Comments:

## 2013-10-18 ENCOUNTER — Other Ambulatory Visit: Payer: Self-pay | Admitting: Family Medicine

## 2013-10-18 DIAGNOSIS — R413 Other amnesia: Secondary | ICD-10-CM

## 2013-10-25 ENCOUNTER — Ambulatory Visit
Admission: RE | Admit: 2013-10-25 | Discharge: 2013-10-25 | Disposition: A | Discharge status: Home / Self Care | Payer: Medicare Other | Source: Ambulatory Visit | Attending: Family Medicine | Admitting: Family Medicine

## 2013-10-25 DIAGNOSIS — R413 Other amnesia: Secondary | ICD-10-CM

## 2013-11-02 ENCOUNTER — Encounter: Payer: Self-pay | Admitting: Neurology

## 2013-11-03 ENCOUNTER — Ambulatory Visit (INDEPENDENT_AMBULATORY_CARE_PROVIDER_SITE_OTHER): Payer: Medicare Other | Admitting: Neurology

## 2013-11-03 ENCOUNTER — Encounter (INDEPENDENT_AMBULATORY_CARE_PROVIDER_SITE_OTHER): Payer: Self-pay

## 2013-11-03 ENCOUNTER — Encounter: Payer: Self-pay | Admitting: Neurology

## 2013-11-03 VITALS — BP 145/69 | HR 59 | Ht 71.5 in | Wt 203.0 lb

## 2013-11-03 DIAGNOSIS — D518 Other vitamin B12 deficiency anemias: Secondary | ICD-10-CM

## 2013-11-03 DIAGNOSIS — R6889 Other general symptoms and signs: Secondary | ICD-10-CM

## 2013-11-03 DIAGNOSIS — R413 Other amnesia: Secondary | ICD-10-CM

## 2013-11-03 MED ORDER — DONEPEZIL HCL 10 MG PO TABS: 10.0000 mg | ORAL_TABLET | Freq: Every day | ORAL | Status: DC

## 2013-11-03 NOTE — Patient Instructions (Signed)
   Begin Aricept (donepezil) at 5 mg at night for one month. If this medication is well-tolerated, please call our office and we will call in a prescription for the 10 mg tablets. Look out for side effects that may include nausea, diarrhea, weight loss, or stomach cramps. This medication will also cause a runny nose, therefore there is no need for allergy medications for this purpose.  

## 2013-11-03 NOTE — Progress Notes (Signed)
Reason for visit: Memory disturbance  Cole Wilson is a 80 y.o. male  History of present illness:  Cole Wilson is a 80 year old right-handed white male with a history of a mild memory disturbance that has been present for about 2 years. The patient began having issues with paying his bills, making errors. The wife has had to become involved with this process. The patient has had some increasing difficulty remembering names for people, and he is repeating himself frequently asking the same questions again and again. The patient is operating a motor vehicle, and he is having occasional problems with directions, but no significant safety issues have been noted. The patient is keeping up with his medications and appointments fairly well. The patient indicates that he has a good energy level, with no problems with fatigue or excessive daytime drowsiness. The patient denies any balance issues or problems controlling the bowels or the bladder. The patient denies any numbness or weakness of the face, arms, or legs. The patient underwent MRI evaluation of the brain done recently, and I have reviewed this on line. This shows evidence of significant cerebral atrophy with hydrocephalus ex vacuo. Very minimal small vessel ischemic changes are noted. The patient is sent to this office for further evaluation. The patient has been placed on low-dose Aricept, currently on 5 mg daily.  Past Medical History  Diagnosis Date  . Psoriasis   . Arthritis   . ED (erectile dysfunction)   . Diverticulitis   . Memory disorder 11/03/2013  . Foot fracture     bilateral    Past Surgical History  Procedure Laterality Date  . Joint replacement  2009    knee (left)  . Other surgical history      fused right ankle 1970/1990  . Tonsillectomy  1952  . Knee arthroscopy Left 2002  . Appendectomy      Family History  Problem Relation Age of Onset  . Cancer Mother     breast, uterine  . Cancer - Lung Father     . Dementia Paternal Grandmother     Social history:  reports that he has never smoked. He has never used smokeless tobacco. He reports that he drinks about 1.2 ounces of alcohol per week. He reports that he does not use illicit drugs.  Medications:  Current Outpatient Prescriptions on File Prior to Visit  Medication Sig Dispense Refill  . Ascorbic Acid (VITAMIN C) 1000 MG tablet Take 1,000 mg by mouth daily.      [name] aspirin 81 MG tablet Take 81 mg by mouth daily.      . calcium carbonate (OS-CAL) 600 MG TABS Take 600 mg by mouth 2 (two) times daily with a meal.      . Glucosamine-Chondroit-Vit C-Mn (GLUCOSAMINE 1500 COMPLEX PO) Take 1 tablet by mouth daily.      . Multiple Vitamin (MULTIVITAMIN) capsule Take 1 capsule by mouth daily.        . Omega-3 1400 MG CAPS Take 1 capsule by mouth daily.       No current facility-administered medications on file prior to visit.     No Known Allergies  ROS:  Out of a complete 14 system review of symptoms, the patient complains only of the following symptoms, and all other reviewed systems are negative.  Hearing loss Memory loss, confusion Runny nose  Blood pressure 145/69, pulse 59, height 5' 11.5" (1.816 m), weight 203 lb (92.08 kg).  Physical Exam  General: The patient is  alert and cooperative at the time of the examination.  Eyes: Pupils are equal, round, and reactive to light. Discs are flat bilaterally.  Neck: The neck is supple, no carotid bruits are noted.  Respiratory: The respiratory examination is clear.  Cardiovascular: The cardiovascular examination reveals a regular rate and rhythm, no obvious murmurs or rubs are noted.  Skin: Extremities are without significant edema.  Neurologic Exam  Mental status: The patient is alert and oriented x 3 at the time of the examination. The patient has apparent normal recent and remote memory, with an apparently normal attention span and concentration ability. Mini-Mental status  examination done today shows a total score of 30/30.  Cranial nerves: Facial symmetry is present. There is good sensation of the face to pinprick and soft touch bilaterally. The strength of the facial muscles and the muscles to head turning and shoulder shrug are normal bilaterally. Speech is well enunciated, no aphasia or dysarthria is noted. Extraocular movements are full. Visual fields are full. The tongue is midline, and the patient has symmetric elevation of the soft palate. No obvious hearing deficits are noted.  Motor: The motor testing reveals 5 over 5 strength of all 4 extremities. Good symmetric motor tone is noted throughout.  Sensory: Sensory testing is intact to pinprick, soft touch, vibration sensation, and position sense on all 4 extremities, with the exception that there is a stocking pattern pinprick sensory deficit up to the knees bilaterally. No evidence of extinction is noted.  Coordination: Cerebellar testing reveals good finger-nose-finger and heel-to-shin bilaterally.  Gait and station: Gait is normal. Tandem gait is normal. Romberg is negative. No drift is seen.  Reflexes: Deep tendon reflexes are symmetric, but are depressed bilaterally. Toes are downgoing bilaterally.   MRI brain 10/25/2013:  IMPRESSION  Advanced cerebral and cerebellar atrophy. Chronic microvascular  ischemic change. No acute intracranial abnormality.     Assessment/Plan:  1. Progressive memory disturbance  The patient is currently scoring fairly well on the Mini-Mental status examination. The patient is on low-dose Aricept, and he will be increased to 10 mg at night within the next 2 weeks. The patient will followup in about 6 months. The MRI of the brain shows significant atrophy, minimal small vessel changes. Blood work will be checked today.  Cole Wilson. Keith Cannen Dupras MD 11/04/2013 1:48 PM  [facility] [address] [place], [place] [id number]  Phone [phone]  Fax [phone]

## 2013-11-04 LAB — TSH: TSH: 2.13 u[IU]/mL (ref 0.450–4.500)

## 2013-11-04 LAB — SYPHILIS: RPR W/REFLEX TO RPR TITER AND TREPONEMAL ANTIBODIES, TRADITIONAL SCREENING AND DIAGNOSIS ALGORITHM: RPR: NONREACTIVE

## 2013-11-04 LAB — VITAMIN B12: Vitamin B-12: 545 pg/mL (ref 211–946)

## 2014-02-21 ENCOUNTER — Encounter: Payer: Self-pay | Admitting: Neurology

## 2014-02-22 NOTE — Telephone Encounter (Signed)
Spoke with Mrs. Faiella and notified her that Cole Wilson will have to come in the office and sign release of information form in front of office staff. She will relay the message to him.

## 2014-05-07 ENCOUNTER — Ambulatory Visit (INDEPENDENT_AMBULATORY_CARE_PROVIDER_SITE_OTHER): Payer: Medicare Other | Admitting: Neurology

## 2014-05-07 ENCOUNTER — Encounter: Payer: Self-pay | Admitting: Neurology

## 2014-05-07 VITALS — BP 110/60 | HR 64 | Wt 196.0 lb

## 2014-05-07 DIAGNOSIS — R413 Other amnesia: Secondary | ICD-10-CM

## 2014-05-07 MED ORDER — DONEPEZIL HCL 10 MG PO TABS: 10.0000 mg | ORAL_TABLET | Freq: Every day | ORAL | Status: DC

## 2014-05-07 NOTE — Progress Notes (Signed)
Reason for visit: Memory disorder  Cole Wilson is an 80 y.o. male  History of present illness:  Cole Wilson is a 80 year old right-handed white male with a history of a progressive memory disorder. The patient indicates that since last seen, he has moved to [facility] [place] in an assisted living situation. The patient is on Aricept taking 10 mg tablet at night, and he is tolerating the medication well. The patient indicates that no other new medical issues have come up since last seen. The patient denies any decrease in appetite, or weight loss. He continues to operate a motor vehicle without difficulty. He denies that he has stopped doing any activities of daily living secondary to memory since last seen.  Past Medical History  Diagnosis Date  . Psoriasis   . Arthritis   . ED (erectile dysfunction)   . Diverticulitis   . Memory disorder 11/03/2013  . Foot fracture     bilateral    Past Surgical History  Procedure Laterality Date  . Joint replacement  2009    knee (left)  . Other surgical history      fused right ankle 1970/1990  . Tonsillectomy  1952  . Knee arthroscopy Left 2002  . Appendectomy      Family History  Problem Relation Age of Onset  . Cancer Mother     breast, uterine  . Cancer - Lung Father   . Dementia Paternal Grandmother     Social history:  reports that he has never smoked. He has never used smokeless tobacco. He reports that he drinks about 1.2 ounces of alcohol per week. He reports that he does not use illicit drugs.   No Known Allergies  Medications:  Current Outpatient Prescriptions on File Prior to Visit  Medication Sig Dispense Refill  . Ascorbic Acid (VITAMIN C) 1000 MG tablet Take 1,000 mg by mouth daily.      [name] aspirin 81 MG tablet Take 81 mg by mouth daily.      . calcium carbonate (OS-CAL) 600 MG TABS Take 600 mg by mouth 2 (two) times daily with a meal.      . Glucosamine-Chondroit-Vit C-Mn (GLUCOSAMINE 1500 COMPLEX PO) Take 1  tablet by mouth daily.      . Multiple Vitamin (MULTIVITAMIN) capsule Take 1 capsule by mouth daily.        . Omega-3 1400 MG CAPS Take 1 capsule by mouth daily.       No current facility-administered medications on file prior to visit.    ROS:  Out of a complete 14 system review of symptoms, the patient complains only of the following symptoms, and all other reviewed systems are negative.  Runny nose Memory loss  Blood pressure 110/60, pulse 64, weight 196 lb (88.905 kg).  Physical Exam  General: The patient is alert and cooperative at the time of the examination.  Skin: No significant peripheral edema is noted.   Neurologic Exam  Mental status: The Mini-Mental status examination done today shows a total score 25/30.  Cranial nerves: Facial symmetry is present. Speech is normal, no aphasia or dysarthria is noted. Extraocular movements are full. Visual fields are full.  Motor: The patient has good strength in all 4 extremities.  Sensory examination: Soft touch sensation is symmetric on the face, arms, and legs.  Coordination: The patient has good finger-nose-finger and heel-to-shin bilaterally.  Gait and station: The patient has a normal gait. Tandem gait is slightly unsteady. Romberg is negative. No drift is  seen.  Reflexes: Deep tendon reflexes are symmetric.   Assessment/Plan:  1. Memory disorder  The patient will continue the Aricept for now, and he will followup in 6 or 7 months. He will contact me if he has any concerns. A prescription for the Aricept was called in.  Marlan Palau MD 05/07/2014 7:46 PM  [facility] [address] [place], [place] [address]  Phone [phone] Fax [phone]

## 2014-06-01 ENCOUNTER — Other Ambulatory Visit: Payer: Self-pay

## 2014-06-18 ENCOUNTER — Other Ambulatory Visit: Payer: Self-pay | Admitting: Neurology

## 2014-11-05 ENCOUNTER — Encounter: Payer: Self-pay | Admitting: Adult Health

## 2014-11-05 ENCOUNTER — Ambulatory Visit (INDEPENDENT_AMBULATORY_CARE_PROVIDER_SITE_OTHER): Payer: Medicare Other | Admitting: Adult Health

## 2014-11-05 VITALS — BP 170/80 | HR 53 | Ht 72.0 in | Wt 199.0 lb

## 2014-11-05 DIAGNOSIS — R413 Other amnesia: Secondary | ICD-10-CM

## 2014-11-05 MED ORDER — DONEPEZIL HCL 10 MG PO TABS: 10.0000 mg | ORAL_TABLET | Freq: Every day | ORAL | Status: DC

## 2014-11-05 NOTE — Patient Instructions (Signed)
Continue Namenda and Aricept.  If you would like to switch to Namenda XR 28 mg please let me know.  If memory worsens call and let us know.

## 2014-11-05 NOTE — Progress Notes (Signed)
I have read the note, and I agree with the clinical assessment and plan.  Mivaan Corbitt KEITH   

## 2014-11-05 NOTE — Progress Notes (Addendum)
PATIENT: Cole Wilson DOB: [birth year]  REASON FOR VISIT: follow up- memory loss HISTORY FROM: patient  HISTORY OF PRESENT ILLNESS: Cole Wilson is an 81 year old male with a history of progressive memory disorder. He returns today for follow-up. The patient is currently taking Aricept 10 mg at bedtime and tolerating it well. The patient states that his memory has remained the same. He is able to complete all ADLs independently. He currently lives at [place] in the assisted living. He denies having to give up any thing due to his memory. He operates a Librarian, academic without difficulty. He states that he has the most difficulty with remembering names of people. He states that if he knows them for a long time he doesn't have any problems however with new acquaintances he tends to forget their names quickly. The patient was in the hospital in February for acute confusion and at that time was started on Namenda. Since then the patient has done well. No new neurological symptoms. No new medical issues.  HISTORY 05/07/14 (WILLIS): Cole Wilson is a 81 year old right-handed white male with a history of a progressive memory disorder. The patient indicates that since last seen, he has moved to [place] in an assisted living situation. The patient is on Aricept taking 10 mg tablet at night, and he is tolerating the medication well. The patient indicates that no other new medical issues have come up since last seen. The patient denies any decrease in appetite, or weight loss. He continues to operate a motor vehicle without difficulty. He denies that he has stopped doing any activities of daily living secondary to memory since last seen.  REVIEW OF SYSTEMS: Out of a complete 14 system review of symptoms, the patient complains only of the following symptoms, and all other reviewed systems are negative.  Memory loss  ALLERGIES: No Known Allergies  HOME MEDICATIONS: Outpatient Prescriptions Prior to  Visit  Medication Sig Dispense Refill  . Ascorbic Acid (VITAMIN C) 1000 MG tablet Take 1,000 mg by mouth daily.    [name] aspirin 81 MG tablet Take 81 mg by mouth daily.    . calcium carbonate (OS-CAL) 600 MG TABS Take 600 mg by mouth 2 (two) times daily with a meal.    . donepezil (ARICEPT) 10 MG tablet TAKE 1 TABLET BY MOUTH EVERY NIGHT AT BEDTIME 90 tablet 1  . Glucosamine-Chondroit-Vit C-Mn (GLUCOSAMINE 1500 COMPLEX PO) Take 1 tablet by mouth daily.    . Multiple Vitamin (MULTIVITAMIN) capsule Take 1 capsule by mouth daily.      . Omega-3 1400 MG CAPS Take 1 capsule by mouth daily.    [name] donepezil (ARICEPT) 10 MG tablet Take 1 tablet (10 mg total) by mouth at bedtime. 30 tablet 5   No facility-administered medications prior to visit.    PAST MEDICAL HISTORY: Past Medical History  Diagnosis Date  . Psoriasis   . Arthritis   . ED (erectile dysfunction)   . Diverticulitis   . Memory disorder 11/03/2013  . Foot fracture     bilateral    PAST SURGICAL HISTORY: Past Surgical History  Procedure Laterality Date  . Joint replacement  2009    knee (left)  . Other surgical history      fused right ankle 1970/1990  . Tonsillectomy  1952  . Knee arthroscopy Left 2002  . Appendectomy      FAMILY HISTORY: Family History  Problem Relation Age of Onset  . Cancer Mother     breast, uterine  .  Cancer - Lung Father   . Dementia Paternal Grandmother     SOCIAL HISTORY: History   Social History  . Marital Status: Married    Spouse Name: N/A  . Number of Children: 3  . Years of Education: MA   Occupational History  . retired    Social History Main Topics  . Smoking status: Never Smoker   . Smokeless tobacco: Never Used  . Alcohol Use: 1.2 oz/week    2 Shots of liquor per week     Comment: 2 shots of liquor per day  . Drug Use: No  . Sexual Activity: Yes   Other Topics Concern  . Not on file   Social History Narrative   Patient lives at home with his wife [name](Suzanne) They  both live at [place].   Retired.   Education Masters.   Right handed.   Caffeine one cup of tea daily.       PHYSICAL EXAM  Filed Vitals:   11/05/14 0848  BP: 170/80  Pulse: 53  Height: 6' (1.829 m)  Weight: 199 lb (90.266 kg)   Body mass index is 26.98 kg/(m^2).  Generalized: Well developed, in no acute distress   Neurological examination  Mentation: Alert oriented to time, place, history taking. Follows all commands speech and language fluent. MMSE 25/30 Cranial nerve II-XII: Pupils were equal round reactive to light. Extraocular movements were full, visual field were full on confrontational test. Facial sensation and strength were normal. Uvula tongue midline. Head turning and shoulder shrug  were normal and symmetric. Motor: The motor testing reveals 5 over 5 strength of all 4 extremities. Good symmetric motor tone is noted throughout.  Sensory: Sensory testing is intact to soft touch on all 4 extremities. No evidence of extinction is noted.  Coordination: Cerebellar testing reveals good finger-nose-finger and heel-to-shin bilaterally.  Gait and station: Gait is normal. Tandem gait is unsteady. Romberg is negative. No drift is seen.  Reflexes: Deep tendon reflexes are symmetric and normal bilaterally.    DIAGNOSTIC DATA (LABS, IMAGING, TESTING) - I reviewed patient records, labs, notes, testing and imaging myself where available.      ASSESSMENT AND PLAN 81 y.o. year old male  has a past medical history of Psoriasis; Arthritis; ED (erectile dysfunction); Diverticulitis; Memory disorder (11/03/2013); and Foot fracture. here with:  1. Memory loss  Patient's memory has remained stable. MMSE 25/30 was previously 25/30. The patient will continue on Aricept. I will refill today. The patient's only taken Namenda 10 mg twice a day. The wife verbalized that they may want to switch to Namenda XR 28 mg daily however she wants to check on the price first. If his symptoms worsen  or he develops new symptoms he should let us know. Otherwise he will follow-up in 6 months or sooner if needed.   Butch PennyMegan Adalene Gulotta, MSN, NP-C 11/05/2014, 8:51 AM [facility] [address], [address] [place], [place] [address] [phone]  Note: This document was prepared with digital dictation and possible smart phrase technology. Any transcriptional errors that result from this process are unintentional.

## 2015-02-11 ENCOUNTER — Other Ambulatory Visit: Payer: Self-pay

## 2015-05-08 ENCOUNTER — Encounter: Payer: Self-pay | Admitting: Adult Health

## 2015-05-08 ENCOUNTER — Ambulatory Visit (INDEPENDENT_AMBULATORY_CARE_PROVIDER_SITE_OTHER): Payer: Medicare Other | Admitting: Adult Health

## 2015-05-08 VITALS — BP 141/68 | HR 59 | Ht 72.0 in | Wt 205.0 lb

## 2015-05-08 DIAGNOSIS — R413 Other amnesia: Secondary | ICD-10-CM | POA: Diagnosis not present

## 2015-05-08 NOTE — Patient Instructions (Signed)
Continue Aricept and Namenda Memory is stable. Driving should be scrutinized.  If your symptoms worsen or you develop new symptoms please let us know.

## 2015-05-08 NOTE — Progress Notes (Signed)
I have read the note, and I agree with the clinical assessment and plan.  WILLIS,CHARLES KEITH   

## 2015-05-08 NOTE — Progress Notes (Signed)
PATIENT: Cole Wilson DOB: 13-Jul-1932  REASON FOR VISIT: follow up- progressive memory disorder HISTORY FROM: patient  HISTORY OF PRESENT ILLNESS: Cole Wilson is an 81 year old male with a history of progressive memory disorder. He returns today for follow-up. The patient continues to take Aricept 10 mg daily at bedtime. He reports that Cole memory has remained the same. He is able to complete all ADLs independently. He states that he has the most trouble remembering names. However this is only related to new acquaintances. The patient doesn't operate a motor vehicle. However in the past he has had issues with getting lost while driving. Wife states that he got lost and he had to call the police and have them give him directions on how to get home. He states that now he only drives to familiar places otherwise Cole wife drives. He denies any new medical issues. He returns today for an evaluation.  HISTORY 11/05/14:Cole Wilson is an 81 year old male with a history of progressive memory disorder. He returns today for follow-up. The patient is currently taking Aricept 10 mg at bedtime and tolerating it well. The patient states that Cole memory has remained the same. He is able to complete all ADLs independently. He currently lives at [place] [facility] in the assisted living. He denies having to give up any thing due to Cole memory. He operates a Librarian, academic without difficulty. He states that he has the most difficulty with remembering names of people. He states that if he knows them for a long time he doesn't have any problems however with new acquaintances he tends to forget their names quickly. The patient was in the hospital in February for acute confusion and at that time was started on Namenda. Since then the patient has done well. No new neurological symptoms. No new medical issues.   REVIEW OF SYSTEMS: Out of a complete 14 system review of symptoms, the patient complains only of the following  symptoms, and all other reviewed systems are negative.  Memory loss  ALLERGIES: No Known Allergies  HOME MEDICATIONS: Outpatient Prescriptions Prior to Visit  Medication Sig Dispense Refill  . Ascorbic Acid (VITAMIN C) 1000 MG tablet Take 1,000 mg by mouth daily.    [name] aspirin 81 MG tablet Take 81 mg by mouth daily.    . calcium carbonate (OS-CAL) 600 MG TABS Take 600 mg by mouth 2 (two) times daily with a meal.    . donepezil (ARICEPT) 10 MG tablet Take 1 tablet (10 mg total) by mouth at bedtime. 90 tablet 3  . Glucosamine-Chondroit-Vit C-Mn (GLUCOSAMINE 1500 COMPLEX PO) Take 1 tablet by mouth daily.    . memantine (NAMENDA) 10 MG tablet Take 10 mg by mouth 2 (two) times daily.    . Multiple Vitamin (MULTIVITAMIN) capsule Take 1 capsule by mouth daily.      . Multiple Vitamins-Minerals (VITEYES AREDS FORMULA) CAPS Take by mouth daily.    . Omega-3 1400 MG CAPS Take 1 capsule by mouth daily.     No facility-administered medications prior to visit.    PAST MEDICAL HISTORY: Past Medical History  Diagnosis Date  . Psoriasis   . Arthritis   . ED (erectile dysfunction)   . Diverticulitis   . Memory disorder 11/03/2013  . Foot fracture     bilateral    PAST SURGICAL HISTORY: Past Surgical History  Procedure Laterality Date  . Joint replacement  2009    knee (left)  . Other surgical history  fused right ankle 1970/1990  . Tonsillectomy  1952  . Knee arthroscopy Left 2002  . Appendectomy      FAMILY HISTORY: Family History  Problem Relation Age of Onset  . Cancer Mother     breast, uterine  . Cancer - Lung Father   . Dementia Paternal Grandmother     SOCIAL HISTORY: Social History   Social History  . Marital Status: Married    Spouse Name: N/A  . Number of Children: 3  . Years of Education: MA   Occupational History  . retired    Social History Main Topics  . Smoking status: Never Smoker   . Smokeless tobacco: Never Used  . Alcohol Use: 1.2 oz/week     2 Shots of liquor per week     Comment: 2 shots of liquor per day  . Drug Use: No  . Sexual Activity: Yes   Other Topics Concern  . Not on file   Social History Narrative   Patient lives at home with Cole Wilson) They both live at [facility].   Retired.   Education Masters.   Right handed.   Caffeine one cup of tea daily.       PHYSICAL EXAM  Filed Vitals:   05/08/15 1051  BP: 141/68  Pulse: 59  Height: 6' (1.829 m)  Weight: 205 lb (92.987 kg)   Body mass index is 27.8 kg/(m^2).   MMSE - Mini Mental State Exam 05/08/2015 11/05/2014  Orientation to time 3 4  Orientation to Place 5 4  Registration 3 3  Attention/ Calculation 5 5  Recall 3 1  Language- name 2 objects 2 2  Language- repeat 1 1  Language- follow 3 step command 2 2  Language- read & follow direction 1 1  Write a sentence 1 1  Copy design 1 1  Total score 27 25   Generalized: Well developed, in no acute distress   Neurological examination  Mentation: Alert oriented to time, place, history taking. Follows all commands speech and language fluent Cranial nerve II-XII: Pupils were equal round reactive to light. Extraocular movements were full, visual field were full on confrontational test. Facial sensation and strength were normal. Uvula tongue midline. Head turning and shoulder shrug  were normal and symmetric. Motor: The motor testing reveals 5 over 5 strength of all 4 extremities. Good symmetric motor tone is noted throughout.  Sensory: Sensory testing is intact to soft touch on all 4 extremities. No evidence of extinction is noted.  Coordination: Cerebellar testing reveals good finger-nose-finger and heel-to-shin bilaterally.  Gait and station: Gait is normal.  Reflexes: Deep tendon reflexes are symmetric and normal bilaterally.   DIAGNOSTIC DATA (LABS, IMAGING, TESTING) - I reviewed patient records, labs, notes, testing and imaging myself where available.  ASSESSMENT AND PLAN 81 y.o. year old  male  has a past medical history of Psoriasis; Arthritis; ED (erectile dysfunction); Diverticulitis; Memory disorder (11/03/2013); and Foot fracture. here with:  1. Memory loss  The patient's memory has remained stable. Cole MMSE today is 27/30 was producing 25/30. He will continue on Aricept and Namenda. Cole previous primary care was prescribing the Namenda. The patient has asked that we take over this prescription however he does not need a refill at this time. I am amenable to this. Patient advised that if Cole symptoms worsen or he develops new symptoms he should let us know. Otherwise he will follow-up in 6 months or sooner if needed.  Cole Penny, MSN, NP-C  05/08/2015, 11:43 AM [facility] [address] [place], [place] [address] [phone]

## 2015-06-21 ENCOUNTER — Encounter (HOSPITAL_COMMUNITY): Payer: Self-pay | Admitting: Emergency Medicine

## 2015-06-21 ENCOUNTER — Emergency Department (HOSPITAL_COMMUNITY): Payer: Medicare Other

## 2015-06-21 ENCOUNTER — Inpatient Hospital Stay (HOSPITAL_COMMUNITY)
Admission: EM | Admit: 2015-06-21 | Discharge: 2015-06-23 | DRG: 871 | Disposition: A | Discharge status: Home / Self Care | Payer: Medicare Other | Attending: Internal Medicine | Admitting: Internal Medicine

## 2015-06-21 DIAGNOSIS — Z7982 Long term (current) use of aspirin: Secondary | ICD-10-CM | POA: Diagnosis not present

## 2015-06-21 DIAGNOSIS — D696 Thrombocytopenia, unspecified: Secondary | ICD-10-CM | POA: Diagnosis present

## 2015-06-21 DIAGNOSIS — K859 Acute pancreatitis without necrosis or infection, unspecified: Secondary | ICD-10-CM | POA: Diagnosis present

## 2015-06-21 DIAGNOSIS — R1010 Upper abdominal pain, unspecified: Secondary | ICD-10-CM

## 2015-06-21 DIAGNOSIS — N179 Acute kidney failure, unspecified: Secondary | ICD-10-CM | POA: Diagnosis present

## 2015-06-21 DIAGNOSIS — F0390 Unspecified dementia without behavioral disturbance: Secondary | ICD-10-CM | POA: Diagnosis present

## 2015-06-21 DIAGNOSIS — A419 Sepsis, unspecified organism: Principal | ICD-10-CM | POA: Diagnosis present

## 2015-06-21 DIAGNOSIS — K858 Other acute pancreatitis without necrosis or infection: Secondary | ICD-10-CM

## 2015-06-21 DIAGNOSIS — R740 Nonspecific elevation of levels of transaminase and lactic acid dehydrogenase [LDH]: Secondary | ICD-10-CM | POA: Diagnosis present

## 2015-06-21 DIAGNOSIS — D72829 Elevated white blood cell count, unspecified: Secondary | ICD-10-CM | POA: Diagnosis not present

## 2015-06-21 DIAGNOSIS — Z7289 Other problems related to lifestyle: Secondary | ICD-10-CM | POA: Diagnosis not present

## 2015-06-21 DIAGNOSIS — F1090 Alcohol use, unspecified, uncomplicated: Secondary | ICD-10-CM | POA: Diagnosis present

## 2015-06-21 DIAGNOSIS — R7401 Elevation of levels of liver transaminase levels: Secondary | ICD-10-CM | POA: Diagnosis present

## 2015-06-21 DIAGNOSIS — K8500 Idiopathic acute pancreatitis without necrosis or infection: Secondary | ICD-10-CM | POA: Diagnosis not present

## 2015-06-21 DIAGNOSIS — Z789 Other specified health status: Secondary | ICD-10-CM | POA: Diagnosis not present

## 2015-06-21 LAB — BRAIN NATRIURETIC PEPTIDE: B NATRIURETIC PEPTIDE 5: 176.3 pg/mL — AB (ref 0.0–100.0)

## 2015-06-21 LAB — CBC WITH DIFFERENTIAL/PLATELET
BASOS ABS: 0 10*3/uL (ref 0.0–0.1)
Basophils Relative: 0 %
EOS PCT: 0 %
Eosinophils Absolute: 0 10*3/uL (ref 0.0–0.7)
HCT: 45 % (ref 39.0–52.0)
Hemoglobin: 15.2 g/dL (ref 13.0–17.0)
LYMPHS ABS: 0.7 10*3/uL (ref 0.7–4.0)
LYMPHS PCT: 3 %
MCH: 30.8 pg (ref 26.0–34.0)
MCHC: 33.8 g/dL (ref 30.0–36.0)
MCV: 91.3 fL (ref 78.0–100.0)
MONO ABS: 1.4 10*3/uL — AB (ref 0.1–1.0)
Monocytes Relative: 5 %
Neutro Abs: 22.9 10*3/uL — ABNORMAL HIGH (ref 1.7–7.7)
Neutrophils Relative %: 92 %
PLATELETS: 226 10*3/uL (ref 150–400)
RBC: 4.93 MIL/uL (ref 4.22–5.81)
RDW: 14.3 % (ref 11.5–15.5)
WBC: 24.9 10*3/uL — ABNORMAL HIGH (ref 4.0–10.5)

## 2015-06-21 LAB — COMPREHENSIVE METABOLIC PANEL
ALT: 98 U/L — ABNORMAL HIGH (ref 17–63)
ANION GAP: 12 (ref 5–15)
AST: 100 U/L — ABNORMAL HIGH (ref 15–41)
Albumin: 4.3 g/dL (ref 3.5–5.0)
Alkaline Phosphatase: 146 U/L — ABNORMAL HIGH (ref 38–126)
BUN: 36 mg/dL — ABNORMAL HIGH (ref 6–20)
CHLORIDE: 108 mmol/L (ref 101–111)
CO2: 20 mmol/L — ABNORMAL LOW (ref 22–32)
Calcium: 8.7 mg/dL — ABNORMAL LOW (ref 8.9–10.3)
Creatinine, Ser: 2.31 mg/dL — ABNORMAL HIGH (ref 0.61–1.24)
GFR, EST AFRICAN AMERICAN: 29 mL/min — AB (ref >60)
GFR, EST NON AFRICAN AMERICAN: 25 mL/min — AB (ref >60)
Glucose, Bld: 124 mg/dL — ABNORMAL HIGH (ref 65–99)
POTASSIUM: 3.9 mmol/L (ref 3.5–5.1)
Sodium: 140 mmol/L (ref 135–145)
Total Bilirubin: 1.5 mg/dL — ABNORMAL HIGH (ref 0.3–1.2)
Total Protein: 7.4 g/dL (ref 6.5–8.1)

## 2015-06-21 LAB — URINALYSIS, ROUTINE W REFLEX MICROSCOPIC
BILIRUBIN URINE: NEGATIVE
Glucose, UA: NEGATIVE mg/dL
Ketones, ur: NEGATIVE mg/dL
Leukocytes, UA: NEGATIVE
Nitrite: NEGATIVE
PH: 5 (ref 5.0–8.0)
Protein, ur: 100 mg/dL — AB
SPECIFIC GRAVITY, URINE: 1.021 (ref 1.005–1.030)
Urobilinogen, UA: 0.2 mg/dL (ref 0.0–1.0)

## 2015-06-21 LAB — URINE MICROSCOPIC-ADD ON

## 2015-06-21 LAB — I-STAT CHEM 8, ED
BUN: 37 mg/dL — ABNORMAL HIGH (ref 6–20)
CALCIUM ION: 1.06 mmol/L — AB (ref 1.13–1.30)
CREATININE: 2.3 mg/dL — AB (ref 0.61–1.24)
Chloride: 107 mmol/L (ref 101–111)
GLUCOSE: 119 mg/dL — AB (ref 65–99)
HCT: 48 % (ref 39.0–52.0)
HEMOGLOBIN: 16.3 g/dL (ref 13.0–17.0)
Potassium: 3.9 mmol/L (ref 3.5–5.1)
Sodium: 142 mmol/L (ref 135–145)
TCO2: 19 mmol/L (ref 0–100)

## 2015-06-21 LAB — I-STAT TROPONIN, ED: TROPONIN I, POC: 0.03 ng/mL (ref 0.00–0.08)

## 2015-06-21 LAB — I-STAT CG4 LACTIC ACID, ED: LACTIC ACID, VENOUS: 3.96 mmol/L — AB (ref 0.5–2.0)

## 2015-06-21 LAB — LACTIC ACID, PLASMA
LACTIC ACID, VENOUS: 3 mmol/L — AB (ref 0.5–2.0)
Lactic Acid, Venous: 2.5 mmol/L (ref 0.5–2.0)

## 2015-06-21 LAB — LIPASE, BLOOD: LIPASE: 725 U/L — AB (ref 11–51)

## 2015-06-21 LAB — MRSA PCR SCREENING: MRSA BY PCR: NEGATIVE

## 2015-06-21 MED ORDER — ONDANSETRON HCL 4 MG PO TABS: 4.0000 mg | ORAL_TABLET | Freq: Four times a day (QID) | ORAL | Status: DC | PRN

## 2015-06-21 MED ORDER — ONDANSETRON HCL 4 MG/2ML IJ SOLN: 4.0000 mg | Freq: Four times a day (QID) | INTRAMUSCULAR | Status: DC | PRN

## 2015-06-21 MED ORDER — HYDROCODONE-ACETAMINOPHEN 5-325 MG PO TABS: 1.0000 | ORAL_TABLET | ORAL | Status: DC | PRN

## 2015-06-21 MED ORDER — CETYLPYRIDINIUM CHLORIDE 0.05 % MT LIQD
7.0000 mL | Freq: Two times a day (BID) | OROMUCOSAL | Status: DC
  Administered 2015-06-21 – 2015-06-23 (×4): 7 mL via OROMUCOSAL

## 2015-06-21 MED ORDER — MEMANTINE HCL 10 MG PO TABS
10.0000 mg | ORAL_TABLET | Freq: Two times a day (BID) | ORAL | Status: DC
  Administered 2015-06-21 – 2015-06-23 (×4): 10 mg via ORAL
  Filled 2015-06-21 (×4): qty 1

## 2015-06-21 MED ORDER — PANTOPRAZOLE SODIUM 40 MG IV SOLR
40.0000 mg | Freq: Once | INTRAVENOUS | Status: AC
  Administered 2015-06-21: 40 mg via INTRAVENOUS
  Filled 2015-06-21: qty 40

## 2015-06-21 MED ORDER — VANCOMYCIN HCL IN DEXTROSE 1-5 GM/200ML-% IV SOLN
1000.0000 mg | Freq: Once | INTRAVENOUS | Status: AC
  Administered 2015-06-21: 1000 mg via INTRAVENOUS
  Filled 2015-06-21: qty 200

## 2015-06-21 MED ORDER — ENOXAPARIN SODIUM 40 MG/0.4ML ~~LOC~~ SOLN
40.0000 mg | SUBCUTANEOUS | Status: DC
  Administered 2015-06-21 – 2015-06-22 (×2): 40 mg via SUBCUTANEOUS
  Filled 2015-06-21 (×2): qty 0.4

## 2015-06-21 MED ORDER — PIPERACILLIN-TAZOBACTAM 3.375 G IVPB
3.3750 g | Freq: Three times a day (TID) | INTRAVENOUS | Status: DC
  Administered 2015-06-21 – 2015-06-23 (×6): 3.375 g via INTRAVENOUS
  Filled 2015-06-21 (×6): qty 50

## 2015-06-21 MED ORDER — PIPERACILLIN-TAZOBACTAM 3.375 G IVPB 30 MIN
3.3750 g | Freq: Once | INTRAVENOUS | Status: AC
  Administered 2015-06-21: 3.375 g via INTRAVENOUS
  Filled 2015-06-21: qty 50

## 2015-06-21 MED ORDER — VANCOMYCIN HCL IN DEXTROSE 1-5 GM/200ML-% IV SOLN
1000.0000 mg | INTRAVENOUS | Status: DC
  Administered 2015-06-22 – 2015-06-23 (×2): 1000 mg via INTRAVENOUS
  Filled 2015-06-21 (×2): qty 200

## 2015-06-21 MED ORDER — SODIUM CHLORIDE 0.9 % IV BOLUS (SEPSIS)
2000.0000 mL | Freq: Once | INTRAVENOUS | Status: AC
  Administered 2015-06-21: 2000 mL via INTRAVENOUS

## 2015-06-21 MED ORDER — SODIUM CHLORIDE 0.9 % IV BOLUS (SEPSIS)
700.0000 mL | Freq: Once | INTRAVENOUS | Status: AC
  Administered 2015-06-21: 700 mL via INTRAVENOUS

## 2015-06-21 MED ORDER — SODIUM CHLORIDE 0.9 % IV SOLN
INTRAVENOUS | Status: DC
  Administered 2015-06-21: 13:00:00 via INTRAVENOUS

## 2015-06-21 MED ORDER — HYDROMORPHONE HCL 1 MG/ML IJ SOLN: 1.0000 mg | INTRAMUSCULAR | Status: DC | PRN

## 2015-06-21 MED ORDER — DONEPEZIL HCL 5 MG PO TABS
10.0000 mg | ORAL_TABLET | Freq: Every day | ORAL | Status: DC
  Administered 2015-06-21 – 2015-06-22 (×2): 10 mg via ORAL
  Filled 2015-06-21 (×2): qty 2

## 2015-06-21 MED ORDER — SODIUM CHLORIDE 0.9 % IJ SOLN
3.0000 mL | Freq: Two times a day (BID) | INTRAMUSCULAR | Status: DC
  Administered 2015-06-21 – 2015-06-23 (×4): 3 mL via INTRAVENOUS

## 2015-06-21 MED ORDER — ASPIRIN EC 81 MG PO TBEC
81.0000 mg | DELAYED_RELEASE_TABLET | Freq: Every day | ORAL | Status: DC
  Administered 2015-06-21 – 2015-06-23 (×3): 81 mg via ORAL
  Filled 2015-06-21 (×3): qty 1

## 2015-06-21 NOTE — ED Notes (Signed)
Informed pt of need for urine. Pt states he is unable to void at this time.

## 2015-06-21 NOTE — H&P (Addendum)
[facility] History and Physical  Cole Wilson JKK:[id number] DOB: 01-08-1932 DOA: 06/21/2015  Referring physician: ED physician, Dr. Roderic Palau PCP: Milagros Evener, MD   Chief Complaint: hypotension   HPI:  Pt is 81 yo male who presented to [facility] ED for evaluation of hypotension that was noted while pt was having abd [place]. Pt was seen by GI doctor and was sent to have abd US done, when he arrived, he was noted to have SBP in 80-90's, HR up to 110 and he was send to ED for further evaluation. Pt reported having epigastric pain several days in duration, intermittent and sharp, 7/10 in severity when present, associated with subjective fevers, chills, nausea, poor oral intake, pain is occasionally but not consistently radiating to the upper abd quadrants. Pt denies similar events in the past, reports occasion alcohol consumption. Pt denies chest pain or shortness of breath, no specific urinary concerns.   In ED, pt noted to be hemodynamically stable, VSS, blood work notable for lipase > 700. LFT's mildly elevated, [facility] asked to admit for further evaluation and management for presumptive acute pancreatitis.   Assessment and Plan:  Principal Problem:   Acute pancreatitis - unclear etiology, pt does report drinking on occasions so wonder if that is contributing cause - will allow clear liquids as he is requesting some liquids - keep on IVF - repeat lipase in AM, provide analgesia and antiemetics as needed  Active Problems:   Sepsis ([place]) - pt met criteria for sepsis with leukocytosis, elevated lactic acid, HR > 100 and RR > 22 - unclear source - continue IVF, follow up on blood and urine cultures - stop ABX if no further convincing evidence of an infectious etiology  - keep on vanc and zosyn for now     Acute renal failure ([place]) - pre renal in etiology - provide IVF and repeat BMP in AM    Transaminitis - possibly alcohol induced - repeat CMET In AM    Alcohol use ([place]) -  place on CIWA  Lovenox SQ for DVT prophylaxis   Radiological Exams on Admission: Ct Abdomen Pelvis Wo Contrast  06/21/2015  CLINICAL DATA:  Acute upper abdominal pain since this morning. History of pancreatitis. Prior appendectomy. Elevated creatinine 2.3. EXAM: CT ABDOMEN AND PELVIS WITHOUT CONTRAST TECHNIQUE: Multidetector CT imaging of the abdomen and pelvis was performed following the standard protocol without IV contrast. COMPARISON:  03/19/2012 FINDINGS: Lower chest: Minor dependent basilar atelectasis. Otherwise clear lung bases. Coronary and thoracic aortic atherosclerosis evident. No pericardial or pleural effusion. Degenerative changes of the lower thoracic spine. Abdomen: Diffuse strandy edema and free fluid about the entire pancreas compatible with acute pancreatitis. No well-defined fluid collection to suggest pseudocyst formation or abscess. No duct dilatation. Small amount of perihepatic and perisplenic upper abdominal free fluid. Liver, gallbladder, biliary system, spleen, and adrenal glands are within normal limits for age and noncontrast imaging. Kidneys demonstrate no obstruction, hydronephrosis, or obstructing ureteral calculus. Stable hypodense cyst in the right kidney midpole measuring 10 mm, image 35 compared to the prior exam. Abdominal atherosclerosis noted diffusely.  Negative for aneurysm. Negative for bowel obstruction, dilatation, ileus, or free air. Normal appendix. Scattered minor colonic diverticulosis. Pelvis: Extensive sigmoid region diverticulosis extending into the rectum. Trace pelvic free fluid. Bladder is underdistended. Pelvic calcifications consistent with venous phleboliths. No pelvic abscess, hemorrhage, hematoma, inguinal abnormality, or hernia. Iliac and femoral atherosclerosis noted. Bones are osteopenic.  Degenerative changes of the spine diffusely. IMPRESSION: Evidence of acute pancreatitis with peripancreatic  stranding edema and free fluid. No associated defined  fluid collection, pseudocyst or abscess. No biliary dilatation Trace amount of abdominal and pelvic ascites Other chronic findings as above. Electronically Signed   By: Jerilynn Mages.  Shick M.D.   On: 06/21/2015 11:28   Dg Chest Port 1 View  06/21/2015  CLINICAL DATA:  Fever EXAM: PORTABLE CHEST 1 VIEW COMPARISON:  August 28, 2014 FINDINGS: There is no edema or consolidation. Heart size and pulmonary vascularity are normal. No adenopathy. There is degenerative change in each shoulder. IMPRESSION: No edema or consolidation. Electronically Signed   By: Lowella Grip III M.D.   On: 06/21/2015 11:23     Code Status: Full Family Communication: Pt at bedside Disposition Plan: Admit for further evaluation    [facility] [phone]   Review of Systems:  Constitutional: Negative for diaphoresis.  HENT: Negative for hearing loss, ear pain, nosebleeds, congestion, sore throat, neck pain, tinnitus and ear discharge.   Eyes: Negative for blurred vision, double vision, photophobia, pain, discharge and redness.  Respiratory: Negative for cough, hemoptysis, sputum production, shortness of breath, wheezing and stridor.   Cardiovascular: Negative for chest pain, palpitations, orthopnea, claudication and leg swelling.  Gastrointestinal: Per HPI Genitourinary: Negative for dysuria, urgency, frequency, hematuria and flank pain.  Musculoskeletal: Negative for myalgias, back pain, joint pain and falls.  Skin: Negative for itching and rash.  Neurological: Negative for dizziness and weakness.  Endo/Heme/Allergies: Negative for environmental allergies and polydipsia. Does not bruise/bleed easily.  Psychiatric/Behavioral: Negative for suicidal ideas. The patient is not nervous/anxious.      Past Medical History  Diagnosis Date  . Psoriasis   . Arthritis   . ED (erectile dysfunction)   . Diverticulitis   . Memory disorder 11/03/2013  . Foot fracture     bilateral    Past Surgical History  Procedure  Laterality Date  . Joint replacement  2009    knee (left)  . Other surgical history      fused right ankle 1970/1990  . Tonsillectomy  1952  . Knee arthroscopy Left 2002  . Appendectomy      Social History:  reports that he has never smoked. He has never used smokeless tobacco. He reports that he drinks about 1.2 oz of alcohol per week. He reports that he does not use illicit drugs.  No Known Allergies  Family History  Problem Relation Age of Onset  . Cancer Mother     breast, uterine  . Cancer - Lung Father   . Dementia Paternal Grandmother     Prior to Admission medications   Medication Sig Start Date End Date Taking? Authorizing Provider  Ascorbic Acid (VITAMIN C) 1000 MG tablet Take 1,000 mg by mouth daily.   Yes Historical Provider, MD  aspirin 81 MG tablet Take 81 mg by mouth daily.   Yes Historical Provider, MD  calcium carbonate (OS-CAL) 600 MG TABS Take 600 mg by mouth 2 (two) times daily with a meal.   Yes Historical Provider, MD  donepezil (ARICEPT) 10 MG tablet Take 1 tablet (10 mg total) by mouth at bedtime. 11/05/14  Yes Ward Givens, NP  Glucosamine-Chondroit-Vit C-Mn (GLUCOSAMINE 1500 COMPLEX PO) Take 1 tablet by mouth daily.   Yes Historical Provider, MD  memantine (NAMENDA) 10 MG tablet Take 10 mg by mouth 2 (two) times daily.   Yes Historical Provider, MD  Multiple Vitamin (MULTIVITAMIN) capsule Take 1 capsule by mouth daily.     Yes Historical Provider, MD  Multiple Vitamins-Minerals (  VITEYES AREDS FORMULA) CAPS Take 1 capsule by mouth daily.    Yes Historical Provider, MD  omeprazole (PRILOSEC OTC) 20 MG tablet Take 20 mg by mouth daily.   Yes Historical Provider, MD    Physical Exam: Filed Vitals:   06/21/15 1149 06/21/15 1201 06/21/15 1230 06/21/15 1245  BP: 110/58 125/65 111/60   Pulse: 77 72 76 75  Temp:      TempSrc:      Resp: 25 26 18 25   Height:      Weight:      SpO2: 100% 98% 98% 97%    Physical Exam  Constitutional: Appears  well-developed and well-nourished. No distress.  HENT: Normocephalic. External right and left ear normal. Dry MM Eyes: Conjunctivae and EOM are normal. PERRLA, no scleral icterus.  Neck: Normal ROM. Neck supple. No JVD. No tracheal deviation. No thyromegaly.  CVS: RRR, S1/S2 +, no gallops, no carotid bruit.  Pulmonary: Effort and breath sounds normal, no stridor, rhonchi, wheezes, rales.  Abdominal: Soft. BS +,  no distension, tenderness in epigastric area  Musculoskeletal: Normal range of motion. No edema and no tenderness.  Lymphadenopathy: No lymphadenopathy noted, cervical, inguinal. Neuro: Alert. Normal reflexes, muscle tone coordination. No cranial nerve deficit. Skin: Skin is warm and dry. No rash noted. Not diaphoretic. No erythema. No pallor.  Psychiatric: Normal mood and affect.   Labs on Admission:  Basic Metabolic Panel:  Recent Labs Lab 06/21/15 1003 06/21/15 1023  NA 140 142  K 3.9 3.9  CL 108 107  CO2 20*  --   GLUCOSE 124* 119*  BUN 36* 37*  CREATININE 2.31* 2.30*  CALCIUM 8.7*  --    Liver Function Tests:  Recent Labs Lab 06/21/15 1003  AST 100*  ALT 98*  ALKPHOS 146*  BILITOT 1.5*  PROT 7.4  ALBUMIN 4.3    Recent Labs Lab 06/21/15 1003  LIPASE 725*   CBC:  Recent Labs Lab 06/21/15 1003 06/21/15 1023  WBC 24.9*  --   NEUTROABS 22.9*  --   HGB 15.2 16.3  HCT 45.0 48.0  MCV 91.3  --   PLT 226  --     EKG: pending    If 7PM-7AM, please contact night-coverage [email or web address] Password [email or web address] 06/21/2015, 1:09 PM

## 2015-06-21 NOTE — ED Notes (Signed)
Made an attempt to collect urine sample ,pt unable to provide one at this time 

## 2015-06-21 NOTE — Progress Notes (Signed)
CRITICAL VALUE ALERT  Critical value received:  Lactic Acid 3.0  Date of notification:  06/21/2015   Time of notification:  1645  Critical value read back:yes  Nurse who received alert:  Cyndy Freezeeutsch, Shun Pletz   MD notified (1st page): Dr. Izola PriceMyers  Time of first page: 1650  MD notified (2nd page):  Time of second page:  Responding MD:  Dr. Izola PriceMyers  Time MD responded:  1651  No new orders received at this time. Continue current IV fluid orders.

## 2015-06-21 NOTE — Progress Notes (Signed)
ANTIBIOTIC CONSULT NOTE - INITIAL  Pharmacy Consult for Vancomycin/Zosyn Indication: sepsis  No Known Allergies  Patient Measurements: Height: 6' (182.9 cm) Weight: 199 lb (90.266 kg) IBW/kg (Calculated) : 77.6  Vital Signs: Temp: 98.8 F (37.1 C) (11/04 1316) Temp Source: Oral (11/04 1316) BP: 131/57 mmHg (11/04 1316) Pulse Rate: 72 (11/04 1316) Intake/Output from previous day:   Intake/Output from this shift: Total I/O In: 192.5 [I.V.:192.5] Out: -   Labs:  Recent Labs  06/21/15 1003 06/21/15 1023  WBC 24.9*  --   HGB 15.2 16.3  PLT 226  --   CREATININE 2.31* 2.30*   Estimated Creatinine Clearance: 27.6 mL/min (by C-G formula based on Cr of 2.3). No results for input(s): VANCOTROUGH, VANCOPEAK, VANCORANDOM, GENTTROUGH, GENTPEAK, GENTRANDOM, TOBRATROUGH, TOBRAPEAK, TOBRARND, AMIKACINPEAK, AMIKACINTROU, AMIKACIN in the last 72 hours.   Microbiology: Recent Results (from the past 720 hour(s))  MRSA PCR Screening     Status: None   Collection Time: 06/21/15  1:53 PM  Result Value Ref Range Status   MRSA by PCR NEGATIVE NEGATIVE Final    Comment:        The GeneXpert MRSA Assay (FDA approved for NASAL specimens only), is one component of a comprehensive MRSA colonization surveillance program. It is not intended to diagnose MRSA infection nor to guide or monitor treatment for MRSA infections.     Medical History: Past Medical History  Diagnosis Date  . Psoriasis   . Arthritis   . ED (erectile dysfunction)   . Diverticulitis   . Memory disorder 11/03/2013  . Foot fracture     bilateral     Assessment: [age] yoM admitted with pancreatitis, code sepsis.  Patient received Vanc 1g and Zosyn 3.375g x 1 and fluid boluses in ED.  Pharmacy consulted for continued vancomycin and zosyn dosing.  - WBC and lactic acid high - AKI, SCr 2.30, CrCl~32 ml/min (CG), ~25 ml/min (normalized) - blood cultures collected  Goal of Therapy:  Vancomycin trough level 15-20  mcg/ml  Doses adjusted per renal function Eradication of infection  Plan:  1.  Zosyn 3.375g IV q8h (4 hour infusion time).  2.  Vancomycin 1g IV q24h. 3.  F/u SCr, trough levels as indicated, clinical course.  Clance Bollunyon, Himani Corona 06/21/2015,5:37 PM

## 2015-06-21 NOTE — ED Notes (Signed)
Patient transported to CT 

## 2015-06-21 NOTE — Progress Notes (Signed)
CRITICAL VALUE ALERT  Critical value received:  Lactic Acid 2.5  Date of notification:  06/21/15  Time of notification:  1949  Critical value read back:Yes.    Nurse who received alert:  Catie Lendell CapriceSullivan  MD notified (1st page):  M. Lynch  Time of first page:  1950  MD notified (2nd page):  Time of second page:  Responding MD:  Daphane ShepherdM Lynch  Time MD responded:  [phone]

## 2015-06-21 NOTE — ED Notes (Signed)
Patient given urinal and instructed to provide sample. Pt instructed on importance of urine sample in determining source of infection and so improving patient's condition. Pt states he is aware and will attempt to void but feels unable to void at this time. Intervention: fluid bolus continued, urinal left within reach.

## 2015-06-21 NOTE — ED Notes (Signed)
Patient c/o abdominal pain onset early this morning, confusion onset today, weakness and difficulty completing ADLs x a few days. Pt hypotensive in triage. Had blood drawn yesterday and WBCs were elevated.

## 2015-06-21 NOTE — ED Provider Notes (Signed)
CSN: [id number]     Arrival date & time 06/21/15  [id number] History   First MD Initiated Contact with Patient 06/21/15 [phone]     Chief Complaint  Patient presents with  . Code Sepsis     (Consider location/radiation/quality/duration/timing/severity/associated sxs/prior Treatment) Patient is a 81 y.o. male presenting with abdominal pain. The history is provided by the spouse (The spouse states the patient has had abdominal pain for couple days. He was seen by a GI doctor who sent him over here for an ultrasound. When he resumed at the hospital his blood pressure was low so we have sent to the emergency department).  Abdominal Pain Pain location:  Epigastric Pain quality: aching   Pain radiates to:  Does not radiate Pain severity:  Moderate Onset quality:  Sudden Timing:  Constant Progression:  Unchanged Chronicity:  Recurrent Context: alcohol use   Associated symptoms: no chest pain, no cough, no diarrhea, no fatigue and no hematuria     Past Medical History  Diagnosis Date  . Psoriasis   . Arthritis   . ED (erectile dysfunction)   . Diverticulitis   . Memory disorder 11/03/2013  . Foot fracture     bilateral   Past Surgical History  Procedure Laterality Date  . Joint replacement  2009    knee (left)  . Other surgical history      fused right ankle 1970/1990  . Tonsillectomy  1952  . Knee arthroscopy Left 2002  . Appendectomy     Family History  Problem Relation Age of Onset  . Cancer Mother     breast, uterine  . Cancer - Lung Father   . Dementia Paternal Grandmother    Social History  Substance Use Topics  . Smoking status: Never Smoker   . Smokeless tobacco: Never Used  . Alcohol Use: 1.2 oz/week    2 Shots of liquor per week     Comment: 2 shots of liquor per day    Review of Systems  Constitutional: Negative for appetite change and fatigue.  HENT: Negative for congestion, ear discharge and sinus pressure.   Eyes: Negative for discharge.  Respiratory:  Negative for cough.   Cardiovascular: Negative for chest pain.  Gastrointestinal: Positive for abdominal pain. Negative for diarrhea.  Genitourinary: Negative for frequency and hematuria.  Musculoskeletal: Negative for back pain.  Skin: Negative for rash.  Neurological: Negative for seizures and headaches.  Psychiatric/Behavioral: Negative for hallucinations.      Allergies  Review of patient's allergies indicates no known allergies.  Home Medications   Prior to Admission medications   Medication Sig Start Date End Date Taking? Authorizing Provider  Ascorbic Acid (VITAMIN C) 1000 MG tablet Take 1,000 mg by mouth daily.   Yes Historical Provider, MD  aspirin 81 MG tablet Take 81 mg by mouth daily.   Yes Historical Provider, MD  calcium carbonate (OS-CAL) 600 MG TABS Take 600 mg by mouth 2 (two) times daily with a meal.   Yes Historical Provider, MD  donepezil (ARICEPT) 10 MG tablet Take 1 tablet (10 mg total) by mouth at bedtime. 11/05/14  Yes Butch PennyMegan Millikan, NP  Glucosamine-Chondroit-Vit C-Mn (GLUCOSAMINE 1500 COMPLEX PO) Take 1 tablet by mouth daily.   Yes Historical Provider, MD  memantine (NAMENDA) 10 MG tablet Take 10 mg by mouth 2 (two) times daily.   Yes Historical Provider, MD  Multiple Vitamin (MULTIVITAMIN) capsule Take 1 capsule by mouth daily.     Yes Historical Provider, MD  Multiple Vitamins-Minerals (VITEYES  AREDS FORMULA) CAPS Take 1 capsule by mouth daily.    Yes Historical Provider, MD  omeprazole (PRILOSEC OTC) 20 MG tablet Take 20 mg by mouth daily.   Yes Historical Provider, MD   BP 125/65 mmHg  Pulse 72  Temp(Src) 98.1 F (36.7 C) (Oral)  Resp 26  Ht 6' (1.829 m)  Wt 199 lb (90.266 kg)  BMI 26.98 kg/m2  SpO2 98% Physical Exam  Constitutional: He is oriented to person, place, and time. He appears well-developed.  HENT:  Head: Normocephalic.  Eyes: Conjunctivae and EOM are normal. No scleral icterus.  Neck: Neck supple. No thyromegaly present.   Cardiovascular: Normal rate and regular rhythm.  Exam reveals no gallop and no friction rub.   No murmur heard. Pulmonary/Chest: No stridor. He has no wheezes. He has no rales. He exhibits no tenderness.  Abdominal: He exhibits no distension. There is tenderness. There is no rebound.  Moderate epigastric tenderness  Musculoskeletal: Normal range of motion. He exhibits no edema.  Lymphadenopathy:    He has no cervical adenopathy.  Neurological: He is oriented to person, place, and time. He exhibits normal muscle tone. Coordination normal.  Skin: No rash noted. No erythema.  Psychiatric: He has a normal mood and affect. His behavior is normal.    ED Course  Procedures (including critical care time) Labs Review Labs Reviewed  CBC WITH DIFFERENTIAL/PLATELET - Abnormal; Notable for the following:    WBC 24.9 (*)    Neutro Abs 22.9 (*)    Monocytes Absolute 1.4 (*)    All other components within normal limits  COMPREHENSIVE METABOLIC PANEL - Abnormal; Notable for the following:    CO2 20 (*)    Glucose, Bld 124 (*)    BUN 36 (*)    Creatinine, Ser 2.31 (*)    Calcium 8.7 (*)    AST 100 (*)    ALT 98 (*)    Alkaline Phosphatase 146 (*)    Total Bilirubin 1.5 (*)    GFR calc non Af Amer 25 (*)    GFR calc Af Amer 29 (*)    All other components within normal limits  LIPASE, BLOOD - Abnormal; Notable for the following:    Lipase 725 (*)    All other components within normal limits  BRAIN NATRIURETIC PEPTIDE - Abnormal; Notable for the following:    B Natriuretic Peptide 176.3 (*)    All other components within normal limits  I-STAT CG4 LACTIC ACID, ED - Abnormal; Notable for the following:    Lactic Acid, Venous 3.96 (*)    All other components within normal limits  I-STAT CHEM 8, ED - Abnormal; Notable for the following:    BUN 37 (*)    Creatinine, Ser 2.30 (*)    Glucose, Bld 119 (*)    Calcium, Ion 1.06 (*)    All other components within normal limits  CULTURE, BLOOD  (ROUTINE X 2)  CULTURE, BLOOD (ROUTINE X 2)  URINE CULTURE  URINALYSIS, ROUTINE W REFLEX MICROSCOPIC (NOT AT [facility])  I-STAT TROPOININ, ED  I-STAT CG4 LACTIC ACID, ED    Imaging Review Ct Abdomen Pelvis Wo Contrast  06/21/2015  CLINICAL DATA:  Acute upper abdominal pain since this morning. History of pancreatitis. Prior appendectomy. Elevated creatinine 2.3. EXAM: CT ABDOMEN AND PELVIS WITHOUT CONTRAST TECHNIQUE: Multidetector CT imaging of the abdomen and pelvis was performed following the standard protocol without IV contrast. COMPARISON:  03/19/2012 FINDINGS: Lower chest: Minor dependent basilar atelectasis. Otherwise clear lung  bases. Coronary and thoracic aortic atherosclerosis evident. No pericardial or pleural effusion. Degenerative changes of the lower thoracic spine. Abdomen: Diffuse strandy edema and free fluid about the entire pancreas compatible with acute pancreatitis. No well-defined fluid collection to suggest pseudocyst formation or abscess. No duct dilatation. Small amount of perihepatic and perisplenic upper abdominal free fluid. Liver, gallbladder, biliary system, spleen, and adrenal glands are within normal limits for age and noncontrast imaging. Kidneys demonstrate no obstruction, hydronephrosis, or obstructing ureteral calculus. Stable hypodense cyst in the right kidney midpole measuring 10 mm, image 35 compared to the prior exam. Abdominal atherosclerosis noted diffusely.  Negative for aneurysm. Negative for bowel obstruction, dilatation, ileus, or free air. Normal appendix. Scattered minor colonic diverticulosis. Pelvis: Extensive sigmoid region diverticulosis extending into the rectum. Trace pelvic free fluid. Bladder is underdistended. Pelvic calcifications consistent with venous phleboliths. No pelvic abscess, hemorrhage, hematoma, inguinal abnormality, or hernia. Iliac and femoral atherosclerosis noted. Bones are osteopenic.  Degenerative changes of the spine diffusely.  IMPRESSION: Evidence of acute pancreatitis with peripancreatic stranding edema and free fluid. No associated defined fluid collection, pseudocyst or abscess. No biliary dilatation Trace amount of abdominal and pelvic ascites Other chronic findings as above. Electronically Signed   By: Judie Petit.  Shick M.D.   On: 06/21/2015 11:28   Dg Chest Port 1 View  06/21/2015  CLINICAL DATA:  Fever EXAM: PORTABLE CHEST 1 VIEW COMPARISON:  August 28, 2014 FINDINGS: There is no edema or consolidation. Heart size and pulmonary vascularity are normal. No adenopathy. There is degenerative change in each shoulder. IMPRESSION: No edema or consolidation. Electronically Signed   By: Bretta Bang III M.D.   On: 06/21/2015 11:23   I have personally reviewed and evaluated these images and lab results as part of my medical decision-making.   EKG Interpretation   Date/Time:  Friday June 21 2015 09:57:42 EDT Ventricular Rate:  101 PR Interval:  156 QRS Duration: 86 QT Interval:  334 QTC Calculation: 433 R Axis:   -28 Text Interpretation:  Sinus tachycardia Borderline left axis deviation  Confirmed by Linnette Panella  MD, Neeya Prigmore [phone]) on 06/21/2015 12:18:37 PM     CRITICAL CARE Performed by: Amerika Nourse L Total critical care time: 40 minutes Critical care time was exclusive of separately billable procedures and treating other patients. Critical care was necessary to treat or prevent imminent or life-threatening deterioration. Critical care was time spent personally by me on the following activities: development of treatment plan with patient and/or surrogate as well as nursing, discussions with consultants, evaluation of patient's response to treatment, examination of patient, obtaining history from patient or surrogate, ordering and performing treatments and interventions, ordering and review of laboratory studies, ordering and review of radiographic studies, pulse oximetry and re-evaluation of patient's  condition.  MDM   Final diagnoses:  Other acute pancreatitis    Patient with pancreatitis. And hypotension. Patient responded to 2 L of fluid.   His blood pressure became normal. He will be admitted to medicine for pancreatitis    Bethann Berkshire, MD 06/21/15 1223

## 2015-06-21 NOTE — ED Notes (Signed)
Bed: WA14 Expected date:  Expected time:  Means of arrival:  Comments: RES B 

## 2015-06-22 ENCOUNTER — Encounter (HOSPITAL_COMMUNITY): Payer: Self-pay | Admitting: Internal Medicine

## 2015-06-22 DIAGNOSIS — A419 Sepsis, unspecified organism: Principal | ICD-10-CM

## 2015-06-22 DIAGNOSIS — R740 Nonspecific elevation of levels of transaminase and lactic acid dehydrogenase [LDH]: Secondary | ICD-10-CM

## 2015-06-22 DIAGNOSIS — N179 Acute kidney failure, unspecified: Secondary | ICD-10-CM

## 2015-06-22 DIAGNOSIS — Z789 Other specified health status: Secondary | ICD-10-CM

## 2015-06-22 DIAGNOSIS — D72829 Elevated white blood cell count, unspecified: Secondary | ICD-10-CM

## 2015-06-22 DIAGNOSIS — F0390 Unspecified dementia without behavioral disturbance: Secondary | ICD-10-CM | POA: Diagnosis present

## 2015-06-22 LAB — COMPREHENSIVE METABOLIC PANEL
ALBUMIN: 3.3 g/dL — AB (ref 3.5–5.0)
ALK PHOS: 103 U/L (ref 38–126)
ALT: 58 U/L (ref 17–63)
ANION GAP: 9 (ref 5–15)
AST: 66 U/L — ABNORMAL HIGH (ref 15–41)
BILIRUBIN TOTAL: 1.1 mg/dL (ref 0.3–1.2)
BUN: 40 mg/dL — ABNORMAL HIGH (ref 6–20)
CALCIUM: 7.6 mg/dL — AB (ref 8.9–10.3)
CO2: 21 mmol/L — ABNORMAL LOW (ref 22–32)
CREATININE: 2.14 mg/dL — AB (ref 0.61–1.24)
Chloride: 111 mmol/L (ref 101–111)
GFR calc non Af Amer: 27 mL/min — ABNORMAL LOW (ref >60)
GFR, EST AFRICAN AMERICAN: 32 mL/min — AB (ref >60)
GLUCOSE: 120 mg/dL — AB (ref 65–99)
Potassium: 3.5 mmol/L (ref 3.5–5.1)
Sodium: 141 mmol/L (ref 135–145)
Total Protein: 5.9 g/dL — ABNORMAL LOW (ref 6.5–8.1)

## 2015-06-22 LAB — CBC
HCT: 36.5 % — ABNORMAL LOW (ref 39.0–52.0)
HEMOGLOBIN: 12.3 g/dL — AB (ref 13.0–17.0)
MCH: 30.5 pg (ref 26.0–34.0)
MCHC: 33.7 g/dL (ref 30.0–36.0)
MCV: 90.6 fL (ref 78.0–100.0)
PLATELETS: 143 10*3/uL — AB (ref 150–400)
RBC: 4.03 MIL/uL — ABNORMAL LOW (ref 4.22–5.81)
RDW: 14.3 % (ref 11.5–15.5)
WBC: 18.7 10*3/uL — ABNORMAL HIGH (ref 4.0–10.5)

## 2015-06-22 LAB — LACTIC ACID, PLASMA: Lactic Acid, Venous: 1.1 mmol/L (ref 0.5–2.0)

## 2015-06-22 LAB — MAGNESIUM: MAGNESIUM: 1.8 mg/dL (ref 1.7–2.4)

## 2015-06-22 NOTE — Progress Notes (Addendum)
[facility] PROGRESS NOTE  Cole Wilson IRC:[id number] DOB: 1932/05/30 DOA: 06/21/2015 PCP: Milagros Evener, MD  Brief narrative:    81 year old male with past medical history of dementia, alcohol use who presented to [facility] ED for evaluation of hypotension that was noted while pt was having abd [place]. Pt was seen by GI doctor and was sent to have abd US done, when he arrived, he was noted to have SBP in 80-90's, HR up to 110 and he was send to ED for further evaluation. Pt also reported having epigastric pain, intermittent and sharp, 7/10 in severity, associated with subjective fevers, chills, nausea, poor oral intake.  In ED, pt noted to be hemodynamically stable. Blood work was notable for ipase > 700. LFT's mildly elevated, TR[facility] asked to admit for further evaluation and management for presumptive acute pancreatitis.  Anticipated discharge: if stable posibly by tomorrow 11/6.  Assessment/Plan:    Principal Problem:   Acute pancreatitis / Transaminitis - Unclear etiology, ?alcoholic pancreatitis - Alcohol and UDS not obtained on the admission - CT abdomen suggestive of acute pancreatitis - Lipase 725 on admission - Repeat lipase in am, repeat LFT's in am. AST is    Active Problems:   Sepsis ([place]) / Leucocytosis - Sepsis criteria met on admission with leukocytosis, lactic acidosis, tachycardia, tachypnea but no clear source of infection. - CT did not show any pseudocysts in pancreatic area - On vanco and zosyn until final report from cx back - So far blood and urine culture negative    Alcohol use ([place]) - Placed on CIWA  - No withdrawals    Acute renal failure (HCC) - Unclear etiology - Cr improving with fluids  - Check BMP tomorrow am    Dementia without behavioral disturbance - Stable - Continue aricept and namenda     DVT Prophylaxis  - Lovenox subQ ordered   Code Status: Full.  Family Communication:  plan of care discussed with the patient Disposition  Plan: Home once medically stable, likely by 11/6 or 11/7 depending on if he tolerates reg diet and if no fevers  IV access:  Peripheral IV  Procedures and diagnostic studies:    Ct Abdomen Pelvis Wo Contrast 06/21/2015  Evidence of acute pancreatitis with peripancreatic stranding edema and free fluid. No associated defined fluid collection, pseudocyst or abscess. No biliary dilatation Trace amount of abdominal and pelvic ascites.  Dg Chest Port 1 View 06/21/2015 No edema or consolidation.   Medical Consultants:  None  Other Consultants:  Nutrition PT   IAnti-Infectives:   Vanco 06/21/2015 --> Zosyn 06/21/2015 -->    Leisa Lenz, MD  [facility] Pager [phone]  Time spent in minutes: 25 minutes  If 7PM-7AM, please contact night-coverage [email or web address] Password [email or web address] 06/22/2015, 2:07 PM   LOS: 1 day    HPI/Subjective: No acute overnight events. Patient reports no nausea, vomiting, no bleeding.   Objective: Filed Vitals:   06/21/15 1316 06/21/15 2145 06/22/15 0451 06/22/15 0840  BP: 131/57 122/60 140/63 140/71  Pulse: 72 72 75 74  Temp: 98.8 F (37.1 C) 99.5 F (37.5 C) 97.7 F (36.5 C) 98.5 F (36.9 C)  TempSrc: Oral Oral Oral Oral  Resp: _0 Height:      Weight:   91.3 kg (201 lb 4.5 oz)   SpO2: 100% 96% 95% 96%    Intake/Output Summary (Last 24 hours) at 06/22/15 1407 Last data filed at 06/22/15 1218  Gross per 24 hour  Intake 1945.5 ml  Output    825 ml  Net 1120.5 ml    Exam:   General:  Pt is alert, follows commands appropriately, not in acute distress  Cardiovascular: Regular rate and rhythm, S1/S2 appreciated   Respiratory: Clear to auscultation bilaterally, no wheezing, no crackles, no rhonchi  Abdomen: Soft, non tender, non distended, bowel sounds present  Extremities: No edema, pulses DP and PT palpable bilaterally  Neuro: Grossly nonfocal  Data Reviewed: Basic Metabolic Panel:  Recent Labs Lab 06/21/15 1003  06/21/15 1023 06/22/15 0530  NA 140 142 141  K 3.9 3.9 3.5  CL 108 107 111  CO2 20*  --  21*  GLUCOSE 124* 119* 120*  BUN 36* 37* 40*  CREATININE 2.31* 2.30* 2.14*  CALCIUM 8.7*  --  7.6*  MG  --   --  1.8   Liver Function Tests:  Recent Labs Lab 06/21/15 1003 06/22/15 0530  AST 100* 66*  ALT 98* 58  ALKPHOS 146* 103  BILITOT 1.5* 1.1  PROT 7.4 5.9*  ALBUMIN 4.3 3.3*    Recent Labs Lab 06/21/15 1003  LIPASE 725*   No results for input(s): AMMONIA in the last 168 hours. CBC:  Recent Labs Lab 06/21/15 1003 06/21/15 1023 06/22/15 0530  WBC 24.9*  --  18.7*  NEUTROABS 22.9*  --   --   HGB 15.2 16.3 12.3*  HCT 45.0 48.0 36.5*  MCV 91.3  --  90.6  PLT 226  --  143*   Cardiac Enzymes: No results for input(s): CKTOTAL, CKMB, CKMBINDEX, TROPONINI in the last 168 hours. BNP: Invalid input(s): POCBNP CBG: No results for input(s): GLUCAP in the last 168 hours.  Recent Results (from the past 240 hour(s))  Blood Culture (routine x 2)     Status: None (Preliminary result)   Collection Time: 06/21/15 10:25 AM  Result Value Ref Range Status   Specimen Description BLOOD RIGHT HAND  Final   Special Requests BOTTLES DRAWN AEROBIC AND ANAEROBIC 10ML  Final   Culture   Final    NO GROWTH < 24 HOURS Performed at [facility]    Report Status PENDING  Incomplete  Blood Culture (routine x 2)     Status: None (Preliminary result)   Collection Time: 06/21/15 10:25 AM  Result Value Ref Range Status   Specimen Description BLOOD RIGHT ANTECUBITAL  Final   Special Requests BOTTLES DRAWN AEROBIC ONLY 5ML  Final   Culture   Final    NO GROWTH < 24 HOURS Performed at [facility]    Report Status PENDING  Incomplete  MRSA PCR Screening     Status: None   Collection Time: 06/21/15  1:53 PM  Result Value Ref Range Status   MRSA by PCR NEGATIVE NEGATIVE Final    Comment:        The GeneXpert MRSA Assay (FDA approved for NASAL specimens only), is one  component of a comprehensive MRSA colonization surveillance program. It is not intended to diagnose MRSA infection nor to guide or monitor treatment for MRSA infections.   Culture, Urine     Status: None (Preliminary result)   Collection Time: 06/21/15  6:00 PM  Result Value Ref Range Status   Specimen Description URINE, CLEAN CATCH  Final   Special Requests Normal  Final   Culture   Final    TOO YOUNG TO READ Performed at [facility]    Report Status PENDING  Incomplete     Scheduled Meds: . antiseptic  oral rinse  7 mL Mouth Rinse BID  . aspirin EC  81 mg Oral Daily  . donepezil  10 mg Oral QHS  . enoxaparin (LOVENOX) injection  40 mg Subcutaneous Q24H  . memantine  10 mg Oral BID  . piperacillin-tazobactam (ZOSYN)  IV  3.375 g Intravenous Q8H  . sodium chloride  3 mL Intravenous Q12H  . vancomycin  1,000 mg Intravenous Q24H   Continuous Infusions:

## 2015-06-22 NOTE — Progress Notes (Signed)
Utilization Review Completed.Cole Wilson T11/12/2014  

## 2015-06-23 DIAGNOSIS — D696 Thrombocytopenia, unspecified: Secondary | ICD-10-CM

## 2015-06-23 LAB — COMPREHENSIVE METABOLIC PANEL
ALBUMIN: 3.3 g/dL — AB (ref 3.5–5.0)
ALT: 44 U/L (ref 17–63)
AST: 62 U/L — AB (ref 15–41)
Alkaline Phosphatase: 98 U/L (ref 38–126)
Anion gap: 9 (ref 5–15)
BILIRUBIN TOTAL: 1.5 mg/dL — AB (ref 0.3–1.2)
BUN: 32 mg/dL — AB (ref 6–20)
CHLORIDE: 107 mmol/L (ref 101–111)
CO2: 24 mmol/L (ref 22–32)
CREATININE: 1.84 mg/dL — AB (ref 0.61–1.24)
Calcium: 8.2 mg/dL — ABNORMAL LOW (ref 8.9–10.3)
GFR calc Af Amer: 38 mL/min — ABNORMAL LOW (ref >60)
GFR, EST NON AFRICAN AMERICAN: 33 mL/min — AB (ref >60)
GLUCOSE: 132 mg/dL — AB (ref 65–99)
POTASSIUM: 4.4 mmol/L (ref 3.5–5.1)
Sodium: 140 mmol/L (ref 135–145)
TOTAL PROTEIN: 6.4 g/dL — AB (ref 6.5–8.1)

## 2015-06-23 LAB — URINE CULTURE
Culture: 1000
SPECIAL REQUESTS: NORMAL

## 2015-06-23 LAB — CBC
HEMATOCRIT: 38 % — AB (ref 39.0–52.0)
Hemoglobin: 12.4 g/dL — ABNORMAL LOW (ref 13.0–17.0)
MCH: 30.3 pg (ref 26.0–34.0)
MCHC: 32.6 g/dL (ref 30.0–36.0)
MCV: 92.9 fL (ref 78.0–100.0)
Platelets: 156 10*3/uL (ref 150–400)
RBC: 4.09 MIL/uL — AB (ref 4.22–5.81)
RDW: 14.3 % (ref 11.5–15.5)
WBC: 16.1 10*3/uL — AB (ref 4.0–10.5)

## 2015-06-23 LAB — LIPASE, BLOOD: Lipase: 48 U/L (ref 11–51)

## 2015-06-23 MED ORDER — LEVOFLOXACIN 500 MG PO TABS: 500.0000 mg | ORAL_TABLET | Freq: Every day | ORAL | Status: DC

## 2015-06-23 NOTE — Discharge Summary (Signed)
Physician Discharge Summary  SEQUOYAH COUNTERMAN AST:[id number] DOB: Jan 14, 1932 DOA: 06/21/2015  PCP: Milagros Evener, MD  Admit date: 06/21/2015 Discharge date: 06/23/2015  Recommendations for Outpatient Follow-up:  1. Levaquin on discharge for 7 days.   Discharge Diagnoses:  Principal Problem:   Acute pancreatitis Active Problems:   Sepsis ([place])   Leucocytosis   Transaminitis   Alcohol use (HCC)   Acute renal failure (HCC)   Dementia without behavioral disturbance    Discharge Condition: stable   Diet recommendation: as tolerated   History of present illness:  81 year old male with past medical history of dementia, alcohol use who presented to [facility] ED for evaluation of hypotension that was noted while pt was having abd [place]. Pt was seen by GI doctor and was sent to have abd US done, when he arrived, he was noted to have SBP in 80-90's, HR up to 110 and he was send to ED for further evaluation. Pt also reported having epigastric pain, intermittent and sharp, 7/10 in severity, associated with subjective fevers, chills, nausea, poor oral intake.  In ED, pt noted to be hemodynamically stable. Blood work was notable for ipase > 700. LFT's mildly elevated, TRH asked to admit for further evaluation and management for presumptive acute pancreatitis.   Hospital Course:    Assessment/Plan:    Principal Problem:  Acute pancreatitis / Transaminitis - Unclear etiology, ?alcoholic pancreatitis - Alcohol and UDS not obtained on the admission - CT abdomen suggestive of acute pancreatitis - Lipase 725 on admission but has normalized prior to discharge - He is tolerating regular diet so far without nausea or vomiting  - Repeat lipase in am, repeat LFT's in am. AST is    Active Problems:  Sepsis ([place]) / Leucocytosis - Sepsis criteria met on admission with leukocytosis, lactic acidosis, tachycardia, tachypnea but no clear source of infection. - CT did not show any pseudocysts in  pancreatic area - On vanco and zosyn since admission but blood cultures showed no growth - He will continue Levaquin for 7 days on discharge considering he still has leukocytosis which seemed to have gotten better with abx   Alcohol use ([place]) - Placed on CIWA  - No withdrawals - Drink gin and tonic almost every night. I spoke with him to cut down alcohol intake.    Acute renal failure (HCC) - Unclear etiology - Cr improved with fluids, from 2.3 --> 1.84    Thrombocytopenia - Mild and likely due to alcohol use - Platelets WNL prior to discharge    Dementia without behavioral disturbance - Continue aricept and namenda on discharge - Stable mental status    DVT Prophylaxis  - Lovenox subQ ordered while pt in hospital   Code Status: Full.  Family Communication: plan of care discussed with the patient   IV access:  Peripheral IV  Procedures and diagnostic studies:   Ct Abdomen Pelvis Wo Contrast 06/21/2015 Evidence of acute pancreatitis with peripancreatic stranding edema and free fluid. No associated defined fluid collection, pseudocyst or abscess. No biliary dilatation Trace amount of abdominal and pelvic ascites.  Dg Chest Port 1 View 06/21/2015 No edema or consolidation.   Medical Consultants:  None  Other Consultants:  Nutrition PT   IAnti-Infectives:   Vanco 06/21/2015 --> 06/23/2015  Zosyn 06/21/2015 --> 116/2016   Signed:  Leisa Lenz, MD  [facility] 06/23/2015, 10:56 AM  Pager #: [phone]  Time spent in minutes: more than 30 minutes   Discharge Exam: Filed Vitals:  06/23/15 0450  BP: 141/57  Pulse: 66  Temp: 97.4 F (36.3 C)  Resp: 20   Filed Vitals:   06/22/15 0840 06/22/15 2148 06/23/15 0400 06/23/15 0450  BP: 140/71 143/65  141/57  Pulse: 74 72  66  Temp: 98.5 F (36.9 C) 98.5 F (36.9 C)  97.4 F (36.3 C)  TempSrc: Oral Oral  Oral  Resp: _0 Height:      Weight:   93.7 kg (206 lb 9.1 oz)    SpO2: 96% 94%  97%    General: Pt is alert, follows commands appropriately, not in acute distress Cardiovascular: Regular rate and rhythm, S1/S2 + Respiratory: Clear to auscultation bilaterally, no wheezing, no crackles, no rhonchi Abdominal: Soft, non tender, non distended, bowel sounds +, no guarding Extremities: no edema, no cyanosis, pulses palpable bilaterally DP and PT Neuro: Grossly nonfocal  Discharge Instructions  Discharge Instructions    Call MD for:  difficulty breathing, headache or visual disturbances    Complete by:  As directed      Call MD for:  persistant nausea and vomiting    Complete by:  As directed      Call MD for:  severe uncontrolled pain    Complete by:  As directed      Diet - low sodium heart healthy    Complete by:  As directed      Discharge instructions    Complete by:  As directed   Continue Levaquin for 7 days on discharge     Increase activity slowly    Complete by:  As directed             Medication List    TAKE these medications        aspirin 81 MG tablet  Take 81 mg by mouth daily.     calcium carbonate 600 MG Tabs tablet  Commonly known as:  OS-CAL  Take 600 mg by mouth 2 (two) times daily with a meal.     donepezil 10 MG tablet  Commonly known as:  ARICEPT  Take 1 tablet (10 mg total) by mouth at bedtime.     GLUCOSAMINE 1500 COMPLEX PO  Take 1 tablet by mouth daily.     levofloxacin 500 MG tablet  Commonly known as:  LEVAQUIN  Take 1 tablet (500 mg total) by mouth daily.     memantine 10 MG tablet  Commonly known as:  NAMENDA  Take 10 mg by mouth 2 (two) times daily.     multivitamin capsule  Take 1 capsule by mouth daily.     omeprazole 20 MG tablet  Commonly known as:  PRILOSEC OTC  Take 20 mg by mouth daily.     vitamin C 1000 MG tablet  Take 1,000 mg by mouth daily.     VITEYES AREDS FORMULA Caps  Take 1 capsule by mouth daily.           Follow-up Information    Follow up with Milagros Evener,  MD. Schedule an appointment as soon as possible for a visit in 1 week.   Specialty:  Family Medicine   Why:  Follow up appt after recent hospitalization   Contact information:   [address] [place] [id number] [phone]        The results of significant diagnostics from this hospitalization (including imaging, microbiology, ancillary and laboratory) are listed below for reference.    Significant Diagnostic Studies: Ct Abdomen Pelvis Wo  Contrast  06/21/2015  CLINICAL DATA:  Acute upper abdominal pain since this morning. History of pancreatitis. Prior appendectomy. Elevated creatinine 2.3. EXAM: CT ABDOMEN AND PELVIS WITHOUT CONTRAST TECHNIQUE: Multidetector CT imaging of the abdomen and pelvis was performed following the standard protocol without IV contrast. COMPARISON:  03/19/2012 FINDINGS: Lower chest: Minor dependent basilar atelectasis. Otherwise clear lung bases. Coronary and thoracic aortic atherosclerosis evident. No pericardial or pleural effusion. Degenerative changes of the lower thoracic spine. Abdomen: Diffuse strandy edema and free fluid about the entire pancreas compatible with acute pancreatitis. No well-defined fluid collection to suggest pseudocyst formation or abscess. No duct dilatation. Small amount of perihepatic and perisplenic upper abdominal free fluid. Liver, gallbladder, biliary system, spleen, and adrenal glands are within normal limits for age and noncontrast imaging. Kidneys demonstrate no obstruction, hydronephrosis, or obstructing ureteral calculus. Stable hypodense cyst in the right kidney midpole measuring 10 mm, image 35 compared to the prior exam. Abdominal atherosclerosis noted diffusely.  Negative for aneurysm. Negative for bowel obstruction, dilatation, ileus, or free air. Normal appendix. Scattered minor colonic diverticulosis. Pelvis: Extensive sigmoid region diverticulosis extending into the rectum. Trace pelvic free fluid. Bladder is  underdistended. Pelvic calcifications consistent with venous phleboliths. No pelvic abscess, hemorrhage, hematoma, inguinal abnormality, or hernia. Iliac and femoral atherosclerosis noted. Bones are osteopenic.  Degenerative changes of the spine diffusely. IMPRESSION: Evidence of acute pancreatitis with peripancreatic stranding edema and free fluid. No associated defined fluid collection, pseudocyst or abscess. No biliary dilatation Trace amount of abdominal and pelvic ascites Other chronic findings as above. Electronically Signed   By: Jerilynn Mages.  Shick M.D.   On: 06/21/2015 11:28   Dg Chest Port 1 View  06/21/2015  CLINICAL DATA:  Fever EXAM: PORTABLE CHEST 1 VIEW COMPARISON:  August 28, 2014 FINDINGS: There is no edema or consolidation. Heart size and pulmonary vascularity are normal. No adenopathy. There is degenerative change in each shoulder. IMPRESSION: No edema or consolidation. Electronically Signed   By: Lowella Grip III M.D.   On: 06/21/2015 11:23    Microbiology: Recent Results (from the past 240 hour(s))  Blood Culture (routine x 2)     Status: None (Preliminary result)   Collection Time: 06/21/15 10:25 AM  Result Value Ref Range Status   Specimen Description BLOOD RIGHT HAND  Final   Special Requests BOTTLES DRAWN AEROBIC AND ANAEROBIC 10ML  Final   Culture   Final    NO GROWTH 2 DAYS Performed at [facility]    Report Status PENDING  Incomplete  Blood Culture (routine x 2)     Status: None (Preliminary result)   Collection Time: 06/21/15 10:25 AM  Result Value Ref Range Status   Specimen Description BLOOD RIGHT ANTECUBITAL  Final   Special Requests BOTTLES DRAWN AEROBIC ONLY 5ML  Final   Culture   Final    NO GROWTH 2 DAYS Performed at [facility]    Report Status PENDING  Incomplete  MRSA PCR Screening     Status: None   Collection Time: 06/21/15  1:53 PM  Result Value Ref Range Status   MRSA by PCR NEGATIVE NEGATIVE Final    Comment:        The  GeneXpert MRSA Assay (FDA approved for NASAL specimens only), is one component of a comprehensive MRSA colonization surveillance program. It is not intended to diagnose MRSA infection nor to guide or monitor treatment for MRSA infections.   Culture, Urine     Status: None   Collection Time:  06/21/15  6:00 PM  Result Value Ref Range Status   Specimen Description URINE, CLEAN CATCH  Final   Special Requests Normal  Final   Culture   Final    1,000 COLONIES/mL INSIGNIFICANT GROWTH Performed at [facility]    Report Status 06/23/2015 FINAL  Final     Labs: Basic Metabolic Panel:  Recent Labs Lab 06/21/15 1003 06/21/15 1023 06/22/15 0530 06/23/15 0526  NA 140 142 141 140  K 3.9 3.9 3.5 4.4  CL 108 107 111 107  CO2 20*  --  21* 24  GLUCOSE 124* 119* 120* 132*  BUN 36* 37* 40* 32*  CREATININE 2.31* 2.30* 2.14* 1.84*  CALCIUM 8.7*  --  7.6* 8.2*  MG  --   --  1.8  --    Liver Function Tests:  Recent Labs Lab 06/21/15 1003 06/22/15 0530 06/23/15 0526  AST 100* 66* 62*  ALT 98* 58 44  ALKPHOS 146* 103 98  BILITOT 1.5* 1.1 1.5*  PROT 7.4 5.9* 6.4*  ALBUMIN 4.3 3.3* 3.3*    Recent Labs Lab 06/21/15 1003 06/23/15 0526  LIPASE 725* 48   No results for input(s): AMMONIA in the last 168 hours. CBC:  Recent Labs Lab 06/21/15 1003 06/21/15 1023 06/22/15 0530 06/23/15 0526  WBC 24.9*  --  18.7* 16.1*  NEUTROABS 22.9*  --   --   --   HGB 15.2 16.3 12.3* 12.4*  HCT 45.0 48.0 36.5* 38.0*  MCV 91.3  --  90.6 92.9  PLT 226  --  143* 156   Cardiac Enzymes: No results for input(s): CKTOTAL, CKMB, CKMBINDEX, TROPONINI in the last 168 hours. BNP: BNP (last 3 results)  Recent Labs  06/21/15 1003  BNP 176.3*    ProBNP (last 3 results) No results for input(s): PROBNP in the last 8760 hours.  CBG: No results for input(s): GLUCAP in the last 168 hours.

## 2015-06-23 NOTE — Evaluation (Signed)
Physical Therapy Evaluation Patient Details Name: Cole Wilson MRN: [id number] DOB: Jan 18, 1932 Today's Date: 06/23/2015   History of Present Illness  81 yo male admitted with pancreatitis, hypotension. Hx of arthritis, memory d/o.   Clinical Impression  On eval, pt was Min guard assist for mobility-walked ~1000 feet in hallway. LOB x 2 during static standing while dual tasking. Unsteady. Recommend HHPT at facility.     Follow Up Recommendations Home health PT (for balance training); 24 hour supervision/assist    Equipment Recommendations  None recommended by PT    Recommendations for Other Services       Precautions / Restrictions Precautions Precautions: Fall Restrictions Weight Bearing Restrictions: No      Mobility  Bed Mobility Overal bed mobility: Modified Independent             General bed mobility comments: Increased time  Transfers Overall transfer level: Needs assistance   Transfers: Sit to/from Stand Sit to Stand: Min guard         General transfer comment: close guard for safety.   Ambulation/Gait Ambulation/Gait assistance: Min guard Ambulation Distance (Feet): 1000 Feet Assistive device: None Gait Pattern/deviations: Step-through pattern;Decreased dorsiflexion - right;Decreased dorsiflexion - left     General Gait Details: cues for heel toe gait pattern. very close guard for safety. Pt tolerated distance well  Stairs            Wheelchair Mobility    Modified Rankin (Stroke Patients Only)       Balance Overall balance assessment: Needs assistance   Sitting balance-Leahy Scale: Good Sitting balance - Comments: LOB x 2 while standing statically.      Standing balance-Leahy Scale: Good               High level balance activites: Side stepping;Backward walking;Direction changes;Turns;Other (comment) High Level Balance Comments: Min guard assist for balance tasks. Pt able to side step, walk backwards, walk forward  with high knee without UE support.              Pertinent Vitals/Pain Pain Assessment: No/denies pain    Home Living Family/patient expects to be discharged to:: Private residence Living Arrangements: Spouse/significant other   Type of Home: Independent living facility [facility])       Home Layout: One level Home Equipment: None      Prior Function Level of Independence: Independent               Hand Dominance        Extremity/Trunk Assessment   Upper Extremity Assessment: Generalized weakness           Lower Extremity Assessment: Generalized weakness      Cervical / Trunk Assessment: Normal  Communication   Communication: No difficulties  Cognition Arousal/Alertness: Awake/alert Behavior During Therapy: WFL for tasks assessed/performed Overall Cognitive Status: History of cognitive impairments - at baseline       Memory: Decreased short-term memory              General Comments      Exercises        Assessment/Plan    PT Assessment Patient needs continued PT services  PT Diagnosis Difficulty walking   PT Problem List Decreased mobility  PT Treatment Interventions Gait training;Therapeutic activities;Patient/family education;Functional mobility training;Balance training;Therapeutic exercise   PT Goals (Current goals can be found in the Care Plan section) Acute Rehab PT Goals Patient Stated Goal: home soon PT Goal Formulation: With patient Time For Goal Achievement: 07/07/15 Potential  to Achieve Goals: Good    Frequency Min 3X/week   Barriers to discharge        Co-evaluation               End of Session Equipment Utilized During Treatment: Gait belt Activity Tolerance: Patient tolerated treatment well Patient left: in chair;with call bell/phone within reach;with chair alarm set           Time: 1035-1055 PT Time Calculation (min) (ACUTE ONLY): 20 min   Charges:   PT Evaluation $Initial PT Evaluation  Tier I: 1 Procedure     PT G Codes:        Rebeca AlertJannie Alinah Sheard, MPT Pager: [phone]

## 2015-06-23 NOTE — Discharge Instructions (Signed)

## 2015-06-26 LAB — CULTURE, BLOOD (ROUTINE X 2)
CULTURE: NO GROWTH
CULTURE: NO GROWTH

## 2015-08-02 ENCOUNTER — Other Ambulatory Visit: Payer: Self-pay | Admitting: Gastroenterology

## 2015-08-02 DIAGNOSIS — K858 Other acute pancreatitis without necrosis or infection: Secondary | ICD-10-CM

## 2015-08-16 ENCOUNTER — Ambulatory Visit
Admission: RE | Admit: 2015-08-16 | Discharge: 2015-08-16 | Disposition: A | Discharge status: Home / Self Care | Payer: Medicare Other | Source: Ambulatory Visit | Attending: Gastroenterology | Admitting: Gastroenterology

## 2015-08-16 DIAGNOSIS — K858 Other acute pancreatitis without necrosis or infection: Secondary | ICD-10-CM

## 2015-11-11 ENCOUNTER — Ambulatory Visit (INDEPENDENT_AMBULATORY_CARE_PROVIDER_SITE_OTHER): Payer: Medicare Other | Admitting: Neurology

## 2015-11-11 ENCOUNTER — Encounter: Payer: Self-pay | Admitting: Neurology

## 2015-11-11 VITALS — BP 147/76 | HR 56 | Ht 72.0 in | Wt 189.2 lb

## 2015-11-11 DIAGNOSIS — F0390 Unspecified dementia without behavioral disturbance: Secondary | ICD-10-CM

## 2015-11-11 MED ORDER — DONEPEZIL HCL 10 MG PO TABS
10.0000 mg | ORAL_TABLET | Freq: Every day | ORAL | Status: DC
Start: 2015-11-11 — End: 2016-11-07

## 2015-11-11 NOTE — Progress Notes (Signed)
Reason for visit: Memory disturbance  Cole Wilson is an 82 y.o. male  History of present illness:  Cole Wilson is an 82 year old right-handed white male with a history of a progressive memory disturbance. The patient no longer is operating a motor vehicle. He had an episode of significant confusion and agitation in the fall of 2016, he was placed on Seroquel at night which seemed to help significantly. He was wandering at night, and he had agitated behavior. He was admitted to the hospital in November 2016 with a bout of pancreatitis. The patient is no longer drinking alcohol at this time. He is on Aricept and Namenda, tolerating the medications well. No other new medical issues have come up since last seen. The wife does take care of the medications and appointments for the patient.  History reviewed. No pertinent past medical history.  Past Surgical History  Procedure Laterality Date  . Joint replacement  2009    knee (left)  . Other surgical history      fused right ankle 1970/1990  . Tonsillectomy  1952  . Knee arthroscopy Left 2002  . Appendectomy      Family History  Problem Relation Age of Onset  . Cancer Mother     breast, uterine  . Cancer - Lung Father   . Dementia Paternal Grandmother     Social history:  reports that he has never smoked. He has never used smokeless tobacco. He reports that he drinks alcohol. He reports that he does not use illicit drugs.   No Known Allergies  Medications:  Prior to Admission medications   Medication Sig Start Date End Date Taking? Authorizing Provider  Ascorbic Acid (VITAMIN C) 1000 MG tablet Take 1,000 mg by mouth daily.   Yes Historical Provider, MD  aspirin 81 MG tablet Take 81 mg by mouth daily.   Yes Historical Provider, MD  calcium carbonate (OS-CAL) 600 MG TABS Take 600 mg by mouth 2 (two) times daily with a meal.   Yes Historical Provider, MD  donepezil (ARICEPT) 10 MG tablet Take 1 tablet (10 mg total) by  mouth at bedtime. 11/05/14  Yes Butch PennyMegan Millikan, NP  Glucosamine-Chondroit-Vit C-Mn (GLUCOSAMINE 1500 COMPLEX PO) Take 1 tablet by mouth daily.   Yes Historical Provider, MD  memantine (NAMENDA) 10 MG tablet Take 10 mg by mouth 2 (two) times daily.   Yes Historical Provider, MD  Multiple Vitamin (MULTIVITAMIN) capsule Take 1 capsule by mouth daily.     Yes Historical Provider, MD  Multiple Vitamins-Minerals (VITEYES AREDS FORMULA) CAPS Take 1 capsule by mouth daily.    Yes Historical Provider, MD  QUEtiapine (SEROQUEL) 50 MG tablet TK 1 T PO  HS 09/27/15  Yes Historical Provider, MD    ROS:  Out of a complete 14 system review of symptoms, the patient complains only of the following symptoms, and all other reviewed systems are negative.  Activity change Runny nose Confusion Memory loss  Blood pressure 147/76, pulse 56, height 6' (1.829 m), weight 189 lb 4 oz (85.843 kg).  Physical Exam  General: The patient is alert and cooperative at the time of the examination.  Skin: No significant peripheral edema is noted.   Neurologic Exam  Mental status: The patient is alert and oriented x 2 at the time of the examination (not oriented to date). The Mini-Mental Status Examination done today shows a total score of 23/30. The patient is able to name 8 animals in 30 seconds.  Cranial nerves: Facial symmetry is present. Speech is normal, no aphasia or dysarthria is noted. Extraocular movements are full. Visual fields are full.  Motor: The patient has good strength in all 4 extremities.  Sensory examination: Soft touch sensation is symmetric on the face, arms, and legs.  Coordination: The patient has good finger-nose-finger and heel-to-shin bilaterally.  Gait and station: The patient has a normal gait. Tandem gait is unsteady. Romberg is negative. No drift is seen.  Reflexes: Deep tendon reflexes are symmetric.   Assessment/Plan:  1. Memory disturbance  The patient will continue on  Aricept and Namenda. The use of Seroquel has reduced the agitation and improved his ability to sleep at night. The patient appears to be relatively stable at this time. He will follow-up in 6-8 months. A prescription was called in for the Aricept.  Marlan Palau MD 11/11/2015 2:17 PM  [facility] [address] [place], [place] [address]  Phone [phone] Fax [phone]

## 2015-11-14 ENCOUNTER — Other Ambulatory Visit: Payer: Self-pay | Admitting: Neurology

## 2015-11-17 ENCOUNTER — Encounter (HOSPITAL_COMMUNITY): Payer: Self-pay | Admitting: Family Medicine

## 2015-11-17 ENCOUNTER — Observation Stay (HOSPITAL_COMMUNITY)
Admission: EM | Admit: 2015-11-17 | Discharge: 2015-11-21 | Disposition: A | Discharge status: Skilled Nursing Facility | Payer: Medicare Other | Attending: Internal Medicine | Admitting: Internal Medicine

## 2015-11-17 DIAGNOSIS — L89151 Pressure ulcer of sacral region, stage 1: Secondary | ICD-10-CM | POA: Diagnosis not present

## 2015-11-17 DIAGNOSIS — F0390 Unspecified dementia without behavioral disturbance: Secondary | ICD-10-CM | POA: Diagnosis present

## 2015-11-17 DIAGNOSIS — I959 Hypotension, unspecified: Secondary | ICD-10-CM | POA: Diagnosis present

## 2015-11-17 DIAGNOSIS — A419 Sepsis, unspecified organism: Secondary | ICD-10-CM | POA: Diagnosis present

## 2015-11-17 DIAGNOSIS — G9340 Encephalopathy, unspecified: Secondary | ICD-10-CM

## 2015-11-17 DIAGNOSIS — N184 Chronic kidney disease, stage 4 (severe): Secondary | ICD-10-CM | POA: Diagnosis not present

## 2015-11-17 DIAGNOSIS — N183 Chronic kidney disease, stage 3 unspecified: Secondary | ICD-10-CM

## 2015-11-17 DIAGNOSIS — A4151 Sepsis due to Escherichia coli [E. coli]: Secondary | ICD-10-CM | POA: Diagnosis not present

## 2015-11-17 DIAGNOSIS — Z7982 Long term (current) use of aspirin: Secondary | ICD-10-CM | POA: Insufficient documentation

## 2015-11-17 DIAGNOSIS — N390 Urinary tract infection, site not specified: Secondary | ICD-10-CM | POA: Diagnosis present

## 2015-11-17 DIAGNOSIS — Z66 Do not resuscitate: Secondary | ICD-10-CM | POA: Diagnosis not present

## 2015-11-17 DIAGNOSIS — L8990 Pressure ulcer of unspecified site, unspecified stage: Secondary | ICD-10-CM | POA: Insufficient documentation

## 2015-11-17 DIAGNOSIS — R319 Hematuria, unspecified: Secondary | ICD-10-CM | POA: Diagnosis not present

## 2015-11-17 DIAGNOSIS — D649 Anemia, unspecified: Secondary | ICD-10-CM | POA: Diagnosis not present

## 2015-11-17 HISTORY — DX: Unspecified dementia, unspecified severity, without behavioral disturbance, psychotic disturbance, mood disturbance, and anxiety: F03.90

## 2015-11-17 LAB — URINE MICROSCOPIC-ADD ON: Squamous Epithelial / LPF: NONE SEEN

## 2015-11-17 LAB — CBC WITH DIFFERENTIAL/PLATELET
BASOS PCT: 0 %
Basophils Absolute: 0 10*3/uL (ref 0.0–0.1)
Eosinophils Absolute: 0 10*3/uL (ref 0.0–0.7)
Eosinophils Relative: 0 %
HEMATOCRIT: 29.2 % — AB (ref 39.0–52.0)
HEMOGLOBIN: 9.8 g/dL — AB (ref 13.0–17.0)
LYMPHS ABS: 1.4 10*3/uL (ref 0.7–4.0)
LYMPHS PCT: 8 %
MCH: 29.4 pg (ref 26.0–34.0)
MCHC: 33.6 g/dL (ref 30.0–36.0)
MCV: 87.7 fL (ref 78.0–100.0)
MONOS PCT: 8 %
Monocytes Absolute: 1.5 10*3/uL — ABNORMAL HIGH (ref 0.1–1.0)
NEUTROS ABS: 14.9 10*3/uL — AB (ref 1.7–7.7)
NEUTROS PCT: 84 %
Platelets: 195 10*3/uL (ref 150–400)
RBC: 3.33 MIL/uL — ABNORMAL LOW (ref 4.22–5.81)
RDW: 13.7 % (ref 11.5–15.5)
WBC: 17.8 10*3/uL — ABNORMAL HIGH (ref 4.0–10.5)

## 2015-11-17 LAB — URINALYSIS, ROUTINE W REFLEX MICROSCOPIC
Bilirubin Urine: NEGATIVE
GLUCOSE, UA: NEGATIVE mg/dL
KETONES UR: NEGATIVE mg/dL
Nitrite: NEGATIVE
PH: 5 (ref 5.0–8.0)
Protein, ur: 100 mg/dL — AB
Specific Gravity, Urine: 1.025 (ref 1.005–1.030)

## 2015-11-17 LAB — I-STAT CHEM 8, ED
BUN: 37 mg/dL — AB (ref 6–20)
CREATININE: 1.6 mg/dL — AB (ref 0.61–1.24)
Calcium, Ion: 1.14 mmol/L (ref 1.13–1.30)
Chloride: 105 mmol/L (ref 101–111)
Glucose, Bld: 150 mg/dL — ABNORMAL HIGH (ref 65–99)
HEMATOCRIT: 38 % — AB (ref 39.0–52.0)
Hemoglobin: 12.9 g/dL — ABNORMAL LOW (ref 13.0–17.0)
Potassium: 4.5 mmol/L (ref 3.5–5.1)
Sodium: 141 mmol/L (ref 135–145)
TCO2: 25 mmol/L (ref 0–100)

## 2015-11-17 LAB — I-STAT CG4 LACTIC ACID, ED: Lactic Acid, Venous: 1.77 mmol/L (ref 0.5–2.0)

## 2015-11-17 MED ORDER — VANCOMYCIN HCL 10 G IV SOLR: 1250.0000 mg | INTRAVENOUS | Status: DC

## 2015-11-17 MED ORDER — PIPERACILLIN-TAZOBACTAM 3.375 G IVPB: 3.3750 g | Freq: Three times a day (TID) | INTRAVENOUS | Status: DC

## 2015-11-17 MED ORDER — SODIUM CHLORIDE 0.9 % IV BOLUS (SEPSIS)
1000.0000 mL | INTRAVENOUS | Status: AC
  Administered 2015-11-17 – 2015-11-18 (×2): 1000 mL via INTRAVENOUS

## 2015-11-17 MED ORDER — PIPERACILLIN-TAZOBACTAM 3.375 G IVPB 30 MIN
3.3750 g | Freq: Once | INTRAVENOUS | Status: AC
  Administered 2015-11-17: 3.375 g via INTRAVENOUS
  Filled 2015-11-17: qty 50

## 2015-11-17 MED ORDER — VANCOMYCIN HCL IN DEXTROSE 1-5 GM/200ML-% IV SOLN
1000.0000 mg | Freq: Once | INTRAVENOUS | Status: AC
  Administered 2015-11-17: 1000 mg via INTRAVENOUS
  Filled 2015-11-17: qty 200

## 2015-11-17 NOTE — Progress Notes (Addendum)
Pharmacy Antibiotic Note  Cole Wilson is a 82 y.o. male admitted on 11/17/2015 with sepsis and UTI.  Pharmacy has been consulted for Vancomycin and Zosyn dosing.  MD change antibiotics to cefepime from Vancomycin and Zosyn.  Plan: Vancomycin/zosyn changed to cefepime per pharmacy consult for UTI by MD. Cefepime 2gm iv q24hr     Temp (24hrs), Avg:100.8 F (38.2 C), Min:100.4 F (38 C), Max:101.2 F (38.4 C)   Recent Labs Lab 11/17/15 2054 11/17/15 2101 11/17/15 2102  WBC 17.8*  --   --   CREATININE  --   --  1.60*  LATICACIDVEN  --  1.77  --     Estimated Creatinine Clearance: 39.7 mL/min (by C-G formula based on Cr of 1.6).    No Known Allergies  Antimicrobials this admission: Vancomycin 4/2 >> 4/3 Zosyn 4/2 >> 4/3 Cefepime 4/3 >>   Microbiology results: Pending  Thank you for allowing pharmacy to be a part of this patient's care.  Aleene DavidsonGrimsley Jr, Accalia Rigdon Crowford 11/17/2015 10:45 PM

## 2015-11-17 NOTE — ED Notes (Signed)
Pt has a urinal and aware a urine sample is needed.

## 2015-11-17 NOTE — ED Notes (Signed)
[facility] [place] [facility] Pt has had increase urination, retention, burning with urination, and blood in shorts. Also, today has altered mental status. Patient was given TYLENOL 1000mg  PO for fever of 100.2 oral.

## 2015-11-17 NOTE — ED Provider Notes (Signed)
CSN: [id number]     Arrival date & time 11/17/15  2021 History  By signing my name below, I, Cole Wilson, attest that this documentation has been prepared under the direction and in the presence of Earley Favor, NP. Electronically Signed: Angelene Giovanni, ED Scribe. 11/17/2015. 8:58 PM.    Chief Complaint  Patient presents with  . Urinary Tract Infection   The history is provided by the patient. No language interpreter was used.   HPI Comments: Cole Wilson is a 82 y.o. male with a hx of dementia who presents to the Emergency Department with family member for evaluation for possible UTI. His family member explains that pt has been complaining of burning with urination and dysuria onset 3 days ago. She states that today pt has had increased altered mental status and she noticed blood in the front of his underwear. She states that she gave pt liquid Tylenol PTA. She adds that these symptoms were similar to when he had a UTI last fall. She denies any n/v/d. Pt is currently febrile but family member denies checking pt's temp PTA.   Past Medical History  Diagnosis Date  . Dementia    Past Surgical History  Procedure Laterality Date  . Joint replacement  2009    knee (left)  . Other surgical history      fused right ankle 1970/1990  . Tonsillectomy  1952  . Knee arthroscopy Left 2002  . Appendectomy     Family History  Problem Relation Age of Onset  . Cancer Mother     breast, uterine  . Cancer - Lung Father   . Dementia Paternal Grandmother    Social History  Substance Use Topics  . Smoking status: Never Smoker   . Smokeless tobacco: Never Used  . Alcohol Use: No    Review of Systems  Constitutional: Positive for fever.  Gastrointestinal: Negative for nausea, vomiting and diarrhea.  Genitourinary: Positive for dysuria and hematuria.  Psychiatric/Behavioral: Positive for confusion.  All other systems reviewed and are negative.     Allergies  Review of  patient's allergies indicates no known allergies.  Home Medications   Prior to Admission medications   Medication Sig Start Date End Date Taking? Authorizing Provider  acetaminophen (TYLENOL) 160 MG/5ML liquid Take 325 mg by mouth every 4 (four) hours as needed for fever.   Yes Historical Provider, MD  Ascorbic Acid (VITAMIN C) 1000 MG tablet Take 1,000 mg by mouth daily.   Yes Historical Provider, MD  aspirin 81 MG tablet Take 81 mg by mouth daily.   Yes Historical Provider, MD  Calcium Carb-Cholecalciferol (CALCIUM 600+D) 600-800 MG-UNIT TABS Take 1 tablet by mouth daily.   Yes Historical Provider, MD  donepezil (ARICEPT) 10 MG tablet Take 1 tablet (10 mg total) by mouth at bedtime. 11/11/15  Yes York Spaniel, MD  Glucosamine-Chondroit-Vit C-Mn (GLUCOSAMINE 1500 COMPLEX PO) Take 1 tablet by mouth daily.   Yes Historical Provider, MD  memantine (NAMENDA) 10 MG tablet Take 10 mg by mouth 2 (two) times daily.   Yes Historical Provider, MD  Multiple Vitamin (MULTIVITAMIN) capsule Take 1 capsule by mouth daily.     Yes Historical Provider, MD  Multiple Vitamins-Minerals (VITEYES AREDS FORMULA) CAPS Take 1 capsule by mouth daily.    Yes Historical Provider, MD  QUEtiapine (SEROQUEL) 50 MG tablet Take s daily at bedtime 09/27/15  Yes Historical Provider, MD   BP 87/55 mmHg  Pulse 73  Temp(Src) 97.8 F (36.6 C) (Oral)  Resp 20  Ht 6' (1.829 m)  Wt 85.73 kg  BMI 25.63 kg/m2  SpO2 98% Physical Exam  Constitutional: He is oriented to person, place, and time. He appears well-developed and well-nourished. No distress.  HENT:  Head: Normocephalic and atraumatic.  Eyes: Conjunctivae and EOM are normal.  Neck: Neck supple. No tracheal deviation present.  Cardiovascular: Normal rate.   Pulmonary/Chest: Effort normal. No respiratory distress.  Musculoskeletal: Normal range of motion.  Neurological: He is alert and oriented to person, place, and time.  Skin: Skin is warm and dry.   Psychiatric: He has a normal mood and affect. His behavior is normal.  Nursing note and vitals reviewed.   ED Course  Procedures (including critical care time) DIAGNOSTIC STUDIES: Oxygen Saturation is 95% on RA, normal by my interpretation.    COORDINATION OF CARE: 8:55 PM- Pt advised of plan for treatment and pt agrees. Pt will receive lab work for further evaluation.    Labs Review Labs Reviewed  CBC WITH DIFFERENTIAL/PLATELET - Abnormal; Notable for the following:    WBC 17.8 (*)    RBC 3.33 (*)    Hemoglobin 9.8 (*)    HCT 29.2 (*)    Neutro Abs 14.9 (*)    Monocytes Absolute 1.5 (*)    All other components within normal limits  URINALYSIS, ROUTINE W REFLEX MICROSCOPIC (NOT AT [facility]) - Abnormal; Notable for the following:    Color, Urine AMBER (*)    APPearance TURBID (*)    Hgb urine dipstick LARGE (*)    Protein, ur 100 (*)    Leukocytes, UA LARGE (*)    All other components within normal limits  URINE MICROSCOPIC-ADD ON - Abnormal; Notable for the following:    Bacteria, UA MANY (*)    Casts HYALINE CASTS (*)    All other components within normal limits  I-STAT CHEM 8, ED - Abnormal; Notable for the following:    BUN 37 (*)    Creatinine, Ser 1.60 (*)    Glucose, Bld 150 (*)    Hemoglobin 12.9 (*)    HCT 38.0 (*)    All other components within normal limits  CULTURE, BLOOD (ROUTINE X 2)  CULTURE, BLOOD (ROUTINE X 2)  URINE CULTURE  COMPREHENSIVE METABOLIC PANEL  I-STAT CG4 LACTIC ACID, ED  I-STAT CG4 LACTIC ACID, ED    Imaging Review No results found.   Earley FavorGail Lamonica Trueba, NP has personally reviewed and evaluated these images and lab results as part of her medical decision-making. Code Sepsis activated Iv antibiotics, fluids cardiac monitoring  MDM   Final diagnoses:  Sepsis due to urinary tract infection (HCC)   I personally performed the services described in this documentation, which was scribed in my presence. The recorded information has been reviewed  and is accurate.  Earley FavorGail Jaxton Casale, NP 11/18/15 0017  Courteney Randall AnLyn Mackuen, MD 11/20/15 [phone]

## 2015-11-18 DIAGNOSIS — G9340 Encephalopathy, unspecified: Secondary | ICD-10-CM

## 2015-11-18 DIAGNOSIS — F0390 Unspecified dementia without behavioral disturbance: Secondary | ICD-10-CM | POA: Diagnosis not present

## 2015-11-18 DIAGNOSIS — A419 Sepsis, unspecified organism: Secondary | ICD-10-CM | POA: Diagnosis present

## 2015-11-18 DIAGNOSIS — N183 Chronic kidney disease, stage 3 unspecified: Secondary | ICD-10-CM

## 2015-11-18 DIAGNOSIS — N390 Urinary tract infection, site not specified: Secondary | ICD-10-CM

## 2015-11-18 DIAGNOSIS — I959 Hypotension, unspecified: Secondary | ICD-10-CM | POA: Diagnosis not present

## 2015-11-18 DIAGNOSIS — D649 Anemia, unspecified: Secondary | ICD-10-CM | POA: Diagnosis present

## 2015-11-18 LAB — COMPREHENSIVE METABOLIC PANEL
ALK PHOS: 77 U/L (ref 38–126)
ALK PHOS: 75 U/L (ref 38–126)
ALT: 9 U/L — AB (ref 17–63)
ALT: 9 U/L — ABNORMAL LOW (ref 17–63)
ANION GAP: 9 (ref 5–15)
AST: 27 U/L (ref 15–41)
AST: 16 U/L (ref 15–41)
Albumin: 3.7 g/dL (ref 3.5–5.0)
Albumin: 3.6 g/dL (ref 3.5–5.0)
Anion gap: 8 (ref 5–15)
BUN: 30 mg/dL — AB (ref 6–20)
BUN: 30 mg/dL — ABNORMAL HIGH (ref 6–20)
CALCIUM: 8.9 mg/dL (ref 8.9–10.3)
CALCIUM: 8.6 mg/dL — AB (ref 8.9–10.3)
CHLORIDE: 107 mmol/L (ref 101–111)
CO2: 23 mmol/L (ref 22–32)
CO2: 24 mmol/L (ref 22–32)
CREATININE: 1.58 mg/dL — AB (ref 0.61–1.24)
Chloride: 111 mmol/L (ref 101–111)
Creatinine, Ser: 1.64 mg/dL — ABNORMAL HIGH (ref 0.61–1.24)
GFR calc non Af Amer: 39 mL/min — ABNORMAL LOW (ref >60)
GFR, EST AFRICAN AMERICAN: 46 mL/min — AB (ref >60)
GFR, EST AFRICAN AMERICAN: 44 mL/min — AB (ref >60)
GFR, EST NON AFRICAN AMERICAN: 38 mL/min — AB (ref >60)
GLUCOSE: 147 mg/dL — AB (ref 65–99)
Glucose, Bld: 122 mg/dL — ABNORMAL HIGH (ref 65–99)
Potassium: 4.5 mmol/L (ref 3.5–5.1)
Potassium: 3.9 mmol/L (ref 3.5–5.1)
SODIUM: 138 mmol/L (ref 135–145)
SODIUM: 144 mmol/L (ref 135–145)
TOTAL PROTEIN: 6.4 g/dL — AB (ref 6.5–8.1)
Total Bilirubin: 0.5 mg/dL (ref 0.3–1.2)
Total Bilirubin: 0.4 mg/dL (ref 0.3–1.2)
Total Protein: 6.6 g/dL (ref 6.5–8.1)

## 2015-11-18 LAB — MRSA PCR SCREENING: MRSA BY PCR: NEGATIVE

## 2015-11-18 LAB — CBC
HCT: 31.4 % — ABNORMAL LOW (ref 39.0–52.0)
HEMOGLOBIN: 10.2 g/dL — AB (ref 13.0–17.0)
MCH: 29 pg (ref 26.0–34.0)
MCHC: 32.5 g/dL (ref 30.0–36.0)
MCV: 89.2 fL (ref 78.0–100.0)
Platelets: 146 10*3/uL — ABNORMAL LOW (ref 150–400)
RBC: 3.52 MIL/uL — ABNORMAL LOW (ref 4.22–5.81)
RDW: 13.9 % (ref 11.5–15.5)
WBC: 13.2 10*3/uL — ABNORMAL HIGH (ref 4.0–10.5)

## 2015-11-18 LAB — I-STAT CG4 LACTIC ACID, ED: LACTIC ACID, VENOUS: 0.46 mmol/L — AB (ref 0.5–2.0)

## 2015-11-18 MED ORDER — ALBUTEROL SULFATE (2.5 MG/3ML) 0.083% IN NEBU: 2.5000 mg | INHALATION_SOLUTION | RESPIRATORY_TRACT | Status: DC | PRN

## 2015-11-18 MED ORDER — SODIUM CHLORIDE 0.9 % IV SOLN
INTRAVENOUS | Status: DC
  Administered 2015-11-18: 02:00:00 via INTRAVENOUS

## 2015-11-18 MED ORDER — SODIUM CHLORIDE 0.9% FLUSH
3.0000 mL | Freq: Two times a day (BID) | INTRAVENOUS | Status: DC
  Administered 2015-11-18 – 2015-11-21 (×8): 3 mL via INTRAVENOUS

## 2015-11-18 MED ORDER — DONEPEZIL HCL 10 MG PO TABS
10.0000 mg | ORAL_TABLET | Freq: Every day | ORAL | Status: DC
  Administered 2015-11-18 – 2015-11-20 (×3): 10 mg via ORAL
  Filled 2015-11-18 (×4): qty 1

## 2015-11-18 MED ORDER — DEXTROSE 5 % IV SOLN
1.0000 g | INTRAVENOUS | Status: DC
  Administered 2015-11-18 – 2015-11-20 (×3): 1 g via INTRAVENOUS
  Filled 2015-11-18 (×4): qty 10

## 2015-11-18 MED ORDER — ACETAMINOPHEN 650 MG RE SUPP: 650.0000 mg | Freq: Four times a day (QID) | RECTAL | Status: DC | PRN

## 2015-11-18 MED ORDER — TAMSULOSIN HCL 0.4 MG PO CAPS
0.4000 mg | ORAL_CAPSULE | Freq: Every day | ORAL | Status: DC
  Administered 2015-11-18 – 2015-11-21 (×4): 0.4 mg via ORAL
  Filled 2015-11-18 (×4): qty 1

## 2015-11-18 MED ORDER — SODIUM CHLORIDE 0.9 % IV SOLN
INTRAVENOUS | Status: DC
  Administered 2015-11-18: 10:00:00 via INTRAVENOUS

## 2015-11-18 MED ORDER — QUETIAPINE FUMARATE 50 MG PO TABS
50.0000 mg | ORAL_TABLET | Freq: Every day | ORAL | Status: DC
  Administered 2015-11-18 – 2015-11-20 (×4): 50 mg via ORAL
  Filled 2015-11-18 (×5): qty 1

## 2015-11-18 MED ORDER — DEXTROSE 5 % IV SOLN: 2.0000 g | INTRAVENOUS | Status: DC

## 2015-11-18 MED ORDER — HEPARIN SODIUM (PORCINE) 5000 UNIT/ML IJ SOLN
5000.0000 [IU] | Freq: Three times a day (TID) | INTRAMUSCULAR | Status: DC
  Administered 2015-11-18 – 2015-11-21 (×10): 5000 [IU] via SUBCUTANEOUS
  Filled 2015-11-18 (×14): qty 1

## 2015-11-18 MED ORDER — DEXTROSE 5 % IV SOLN
2.0000 g | Freq: Once | INTRAVENOUS | Status: AC
  Administered 2015-11-18: 2 g via INTRAVENOUS
  Filled 2015-11-18: qty 2

## 2015-11-18 MED ORDER — ASPIRIN EC 81 MG PO TBEC
81.0000 mg | DELAYED_RELEASE_TABLET | Freq: Every day | ORAL | Status: DC
  Administered 2015-11-18 – 2015-11-21 (×4): 81 mg via ORAL
  Filled 2015-11-18 (×4): qty 1

## 2015-11-18 MED ORDER — ADULT MULTIVITAMIN W/MINERALS CH
1.0000 | ORAL_TABLET | Freq: Every day | ORAL | Status: DC
  Administered 2015-11-18 – 2015-11-21 (×4): 1 via ORAL
  Filled 2015-11-18 (×4): qty 1

## 2015-11-18 MED ORDER — CALCIUM CARBONATE-VITAMIN D 500-200 MG-UNIT PO TABS
1.0000 | ORAL_TABLET | Freq: Every day | ORAL | Status: DC
  Administered 2015-11-18 – 2015-11-21 (×4): 1 via ORAL
  Filled 2015-11-18 (×4): qty 1

## 2015-11-18 MED ORDER — ACETAMINOPHEN 160 MG/5ML PO LIQD: 325.0000 mg | ORAL | Status: DC | PRN

## 2015-11-18 MED ORDER — VITAMIN C 500 MG PO TABS
1000.0000 mg | ORAL_TABLET | Freq: Every day | ORAL | Status: DC
  Administered 2015-11-18 – 2015-11-21 (×4): 1000 mg via ORAL
  Filled 2015-11-18 (×4): qty 2

## 2015-11-18 MED ORDER — VITEYES AREDS FORMULA PO CAPS: 1.0000 | ORAL_CAPSULE | Freq: Every day | ORAL | Status: DC

## 2015-11-18 MED ORDER — DONEPEZIL HCL 10 MG PO TABS: 10.0000 mg | ORAL_TABLET | Freq: Every day | ORAL | Status: DC

## 2015-11-18 MED ORDER — MEMANTINE HCL 5 MG PO TABS
10.0000 mg | ORAL_TABLET | Freq: Two times a day (BID) | ORAL | Status: DC
  Administered 2015-11-18 – 2015-11-21 (×8): 10 mg via ORAL
  Filled 2015-11-18 (×9): qty 2

## 2015-11-18 MED ORDER — GLUCOSAMINE 1500 COMPLEX PO CAPS: 1.0000 | ORAL_CAPSULE | Freq: Every day | ORAL | Status: DC

## 2015-11-18 MED ORDER — ACETAMINOPHEN 325 MG PO TABS
650.0000 mg | ORAL_TABLET | Freq: Four times a day (QID) | ORAL | Status: DC | PRN
  Administered 2015-11-18: 650 mg via ORAL
  Filled 2015-11-18: qty 2

## 2015-11-18 NOTE — Care Management Note (Signed)
Case Management Note  Patient Details  Name: Denyce RobertRichard J Chronister MRN: [id number] Date of Birth: 1931/10/02  Subjective/Objective:       sepsis             Action/Plan: Date:  November 18, 2015 Chart reviewed for concurrent status and case management needs. Will continue to follow patient for changes and needs: Marcelle Smilinghonda Davis, BSN, RN, [place]   [phone]  Expected Discharge Date:                  Expected Discharge Plan:  Home/Self Care  In-House Referral:  NA  Discharge planning Services  CM Consult  Post Acute Care Choice:  NA Choice offered to:  NA  DME Arranged:    DME Agency:     HH Arranged:    HH Agency:     Status of Service:  Completed, signed off  Medicare Important Message Given:    Date Medicare IM Given:    Medicare IM give by:    Date Additional Medicare IM Given:    Additional Medicare Important Message give by:     If discussed at Long Length of Stay Meetings, dates discussed:    Additional Comments:  Golda AcreDavis, Rhonda Lynn, RN 11/18/2015, 10:44 AM

## 2015-11-18 NOTE — H&P (Signed)
[facility] History and Physical  Cole Wilson XBM:[id number] DOB: 07/04/1932 DOA: 11/17/2015  Referring physician: ED PCP: Cole Heron, MD   Chief Complaint: Altered  HPI:  [name] is a 82 year old male with a past medical history significant for dementia and CKD; who presents with complaints of being acutely altered from baseline. History is obtained from the wife as the patient has history of dementia. At baseline he is able to complete all of his ADLs without assistance and only really has problems with his sequence of events and dates for the most part. Denies that he any significant history of hypertension or heart disease. She notes that his symptoms started initially 3 days ago with him complaining of some burning with urination. She also noted a little blood stained in his underwear. However, yesterday (4/2) he was acutely and more confused and disoriented. She notes that he had difficulty finding the bathroom and their are small apartment, he tried putting his shirt on his legs, had difficulty getting up from a chair, and was unsteady with a shuffling gait. All these things are very abnormal for him. She states that this is happened 2 times over the last year or so which led her to think he had a urinary tract infection and brought him into the hospital for further evaluation. The only new medication that was recently started was Seroquel, which she states has significantly helped with symptoms of night wandering that he was having. The patient himself denies any complaints at this time.  Upon admission patient was evaluated and seen have a rectal temperature 102F, blood pressure 87/55, respiratory rate of 21, and all other vitals within normal limits. Initial lab work revealed a WBC of 17.8, hemoglobin 12.9, BUN 37, creatinine 1.6, lactic acid 0.46. Urinalysis was positive for many bacteria, large leukocytes, and too numerous to count WBCs   Review of Systems   Constitutional: Positive for fever. Negative for chills and diaphoresis.  HENT: Negative for hearing loss and tinnitus.   Eyes: Negative for photophobia and pain.  Respiratory: Negative for hemoptysis, sputum production and shortness of breath.   Cardiovascular: Negative for palpitations and orthopnea.  Gastrointestinal: Negative for nausea, abdominal pain and diarrhea.  Genitourinary: Positive for dysuria and hematuria.  Musculoskeletal: Negative for joint pain and falls.  Skin: Negative for itching and rash.  Neurological: Positive for weakness. Negative for speech change, focal weakness and headaches.  Endo/Heme/Allergies: Negative for environmental allergies. Does not bruise/bleed easily.  Psychiatric/Behavioral: Positive for memory loss.       Past Medical History  Diagnosis Date  . Dementia      Past Surgical History  Procedure Laterality Date  . Joint replacement  2009    knee (left)  . Other surgical history      fused right ankle 1970/1990  . Tonsillectomy  1952  . Knee arthroscopy Left 2002  . Appendectomy        Social History:  reports that he has never smoked. He has never used smokeless tobacco. He reports that he does not drink alcohol or use illicit drugs. Where does patient live--home and with whom if at home? wife Can patient participate in ADLs? yes  No Known Allergies  Family History  Problem Relation Age of Onset  . Cancer Mother     breast, uterine  . Cancer - Lung Father   . Dementia Paternal Grandmother        Prior to Admission medications   Medication Sig Start Date End  Date Taking? Authorizing Provider  acetaminophen (TYLENOL) 160 MG/5ML liquid Take 325 mg by mouth every 4 (four) hours as needed for fever.   Yes Historical Provider, MD  Ascorbic Acid (VITAMIN C) 1000 MG tablet Take 1,000 mg by mouth daily.   Yes Historical Provider, MD  aspirin 81 MG tablet Take 81 mg by mouth daily.   Yes Historical Provider, MD  Calcium  Carb-Cholecalciferol (CALCIUM 600+D) 600-800 MG-UNIT TABS Take 1 tablet by mouth daily.   Yes Historical Provider, MD  donepezil (ARICEPT) 10 MG tablet Take 1 tablet (10 mg total) by mouth at bedtime. 11/11/15  Yes Cole Spanielharles K Willis, MD  Glucosamine-Chondroit-Vit C-Mn (GLUCOSAMINE 1500 COMPLEX PO) Take 1 tablet by mouth daily.   Yes Historical Provider, MD  memantine (NAMENDA) 10 MG tablet Take 10 mg by mouth 2 (two) times daily.   Yes Historical Provider, MD  Multiple Vitamin (MULTIVITAMIN) capsule Take 1 capsule by mouth daily.     Yes Historical Provider, MD  Multiple Vitamins-Minerals (VITEYES AREDS FORMULA) CAPS Take 1 capsule by mouth daily.    Yes Historical Provider, MD  QUEtiapine (SEROQUEL) 50 MG tablet Take 50mg s daily at bedtime 09/27/15  Yes Historical Provider, MD     Physical Exam: Filed Vitals:   11/18/15 0000 11/18/15 0015 11/18/15 0030 11/18/15 0045  BP: 93/66 93/53 100/51 95/54  Pulse: 69 70 67 65  Temp:      TempSrc:      Resp: 19 19 19 21   Height:      Weight:      SpO2: 97% 97% 95% 97%     Constitutional: Vital signs reviewed. Patient is a well-developed and well-nourished in no acute distress and cooperative with exam. Alert and oriented x3.  Head: Normocephalic and atraumatic  Ear: TM normal bilaterally  Mouth: no erythema or exudates, MMM  Eyes: PERRL, EOMI, conjunctivae normal, No scleral icterus.  Neck: Supple, Trachea midline normal ROM, No JVD, mass, thyromegaly, or carotid bruit present.  Cardiovascular: RRR, S1 normal, S2 normal, no MRG, pulses symmetric and intact bilaterally  Pulmonary/Chest: CTAB, no wheezes, rales, or rhonchi  Abdominal: Soft. Non-tender, non-distended, bowel sounds are normal, no masses, organomegaly, or guarding present.  GU: no CVA tenderness Musculoskeletal: No joint deformities, erythema, or stiffness, ROM full and no nontender Ext: no edema and no cyanosis, pulses palpable bilaterally (DP and PT)  Hematology: no cervical,  inginal, or axillary adenopathy.  Neurological: A&O x3, Strenght is normal and symmetric bilaterally, cranial nerve II-XII are grossly intact, no focal motor deficit, sensory intact to light touch bilaterally.  Skin: Warm, dry and intact. No rash, cyanosis, or clubbing.  Psychiatric: Normal mood and affect. speech and behavior is normal. Judgment and thought content normal. Cognition and memory are normal.      Data Review   Micro Results No results found for this or any previous visit (from the past 240 hour(s)).  Radiology Reports No results found.   CBC  Recent Labs Lab 11/17/15 2054 11/17/15 2102  WBC 17.8*  --   HGB 9.8* 12.9*  HCT 29.2* 38.0*  PLT 195  --   MCV 87.7  --   MCH 29.4  --   MCHC 33.6  --   RDW 13.7  --   LYMPHSABS 1.4  --   MONOABS 1.5*  --   EOSABS 0.0  --   BASOSABS 0.0  --     Chemistries   Recent Labs Lab 11/17/15 2102 11/17/15 2252  NA 141 138  K 4.5 4.5  CL 105 107  CO2  --  23  GLUCOSE 150* 147*  BUN 37* 30*  CREATININE 1.60* 1.58*  CALCIUM  --  8.9  AST  --  27  ALT  --  9*  ALKPHOS  --  77  BILITOT  --  0.5   ------------------------------------------------------------------------------------------------------------------ estimated creatinine clearance is 40.2 mL/min (by C-G formula based on Cr of 1.58). ------------------------------------------------------------------------------------------------------------------ No results for input(s): HGBA1C in the last 72 hours. ------------------------------------------------------------------------------------------------------------------ No results for input(s): CHOL, HDL, LDLCALC, TRIG, CHOLHDL, LDLDIRECT in the last 72 hours. ------------------------------------------------------------------------------------------------------------------ No results for input(s): TSH, T4TOTAL, T3FREE, THYROIDAB in the last 72 hours.  Invalid input(s):  FREET3 ------------------------------------------------------------------------------------------------------------------ No results for input(s): VITAMINB12, FOLATE, FERRITIN, TIBC, IRON, RETICCTPCT in the last 72 hours.  Coagulation profile No results for input(s): INR, PROTIME in the last 168 hours.  No results for input(s): DDIMER in the last 72 hours.  Cardiac Enzymes No results for input(s): CKMB, TROPONINI, MYOGLOBIN in the last 168 hours.  Invalid input(s): CK ------------------------------------------------------------------------------------------------------------------ Invalid input(s): POCBNP   CBG: No results for input(s): GLUCAP in the last 168 hours.     EKG: Independently reviewed. Sinus rhythm with atrial premature complexes   Assessment/Plan Sepsis due to urinary tract infection  - Admit to stepdown - Sepsis protocol - Follow-up cultures - Changed antibiotics from Vanc and Zosyn to cefepime for coverage to a UTI - IVF NS at 100 ml/hr  Hypotension: Thought secondary to acute dehydration - IV fluids as seen above  Anemia: Hemoglobin initially noted to 9.8 and recheck was documented at 12.9. - Continue to monitor recheck CBC in a.m.  Dementia -Continue donepezil, Namenda, and Seroquel at night   Code Status:   full Family Communication: bedside Disposition Plan: admit   Total time spent 55 minutes.Greater than 50% of this time was spent in counseling, explanation of diagnosis, planning of further management, and coordination of care  Clydie Braun [facility] Pager [phone]  If 7PM-7AM, please contact night-coverage [email or web address] Password [email or web address] 11/18/2015, 1:30 AM

## 2015-11-18 NOTE — Progress Notes (Deleted)
[facility] PROGRESS NOTE    Progress Note   Cole Wilson:[id number] DOB: 10/24/1931 DOA: 11/17/2015 PCP: [name], MD   Brief Narrative:   Cole Wilson is an 82 y.o. male past medical history significant for dementia and chronic renal disease, family brought him to the emergency room as he got acutely altered.  Assessment/Plan:   Sepsis due to urinary tract infection [facility]): No further hematuria Cont. Flomax. Cont  Rocephin. Urine cultures and blood cultures are negative. KVO IV fluids. Has remained afebrile partners are stable.  Acute encephalopathy superimpose on Dementia without behavioral disturbance: Resolved seems to be back to baseline.  Normocytic Anemia Hemoglobin seems to be stable we'll continue to monitor will need further follow-up as an outpatient.  History of alcohol abuse: Monitor with serial protocol. DVT Prophylaxis - Lovenox ordered.  Family Communication:none Disposition Plan: Home in am Code Status:     Code Status Orders        Start     Ordered   11/18/15 0113  Full code   Continuous     11/18/15 0114    Code Status History    Date Active Date Inactive Code Status Order ID Comments User Context   06/21/2015  1:21 PM 06/23/2015  3:29 PM Full Code [id number]  Dorothea Ogle, MD Inpatient    Advance Directive Documentation        Most Recent Value   Type of Advance Directive  Out of facility [name] (pink MOST or yellow form) [Form is not with patient. ]   Pre-existing out of facility [name] order (yellow form or pink MOST form)     "MOST" Form in Place?          IV Access:    Peripheral IV   Procedures and diagnostic studies:   No results found.   Medical Consultants:    None.  Anti-Infectives:   Anti-infectives    Start     Dose/Rate Route Frequency Ordered Stop   11/18/15 2200  vancomycin (VANCOCIN) 1,250 mg in sodium chloride 0.9 % 250 mL IVPB  Status:  Discontinued     1,250 mg 166.7 mL/hr  over 90 Minutes Intravenous Every 24 hours 11/17/15 2245 11/18/15 0150   11/18/15 2200  ceFEPIme (MAXIPIME) 2 g in dextrose 5 % 50 mL IVPB  Status:  Discontinued     2 g 100 mL/hr over 30 Minutes Intravenous Every 24 hours 11/18/15 0433 11/18/15 0901   11/18/15 1000  cefTRIAXone (ROCEPHIN) 1 g in dextrose 5 % 50 mL IVPB     1 g 100 mL/hr over 30 Minutes Intravenous Every 24 hours 11/18/15 0901     11/18/15 0600  piperacillin-tazobactam (ZOSYN) IVPB 3.375 g  Status:  Discontinued     3.375 g 12.5 mL/hr over 240 Minutes Intravenous 3 times per day 11/17/15 2243 11/18/15 0150   11/18/15 0200  ceFEPIme (MAXIPIME) 2 g in dextrose 5 % 50 mL IVPB     2 g 100 mL/hr over 30 Minutes Intravenous  Once 11/18/15 0150 11/18/15 0255   11/17/15 2245  piperacillin-tazobactam (ZOSYN) IVPB 3.375 g     3.375 g 100 mL/hr over 30 Minutes Intravenous  Once 11/17/15 2231 11/17/15 2337   11/17/15 2245  vancomycin (VANCOCIN) IVPB 1000 mg/200 mL premix     1,000 mg 200 mL/hr over 60 Minutes Intravenous  Once 11/17/15 2231 11/18/15 0037      Subjective:    Cole Wilson no complains dysuria resolved.  Objective:    Filed Vitals:   11/18/15 0600 11/18/15 0900 11/18/15 1000 11/18/15 1137  BP: 92/41  153/81 112/55  Pulse: 74  73 91  Temp:  98.7 F (37.1 C)  100 F (37.8 C)  TempSrc:  Oral  Oral  Resp: 20  17 18   Height:      Weight:      SpO2: 93%  99% 95%    Intake/Output Summary (Last 24 hours) at 11/18/15 1653 Last data filed at 11/18/15 1647  Gross per 24 hour  Intake    470 ml  Output    600 ml  Net   -130 ml   Filed Weights   11/17/15 2252 11/18/15 0250  Weight: 85.73 kg (189 lb) 85.7 kg (188 lb 15 oz)    Exam: Gen:  NAD Cardiovascular:  RRR. Chest and lungs:   Good air movement clear to auscultation. Abdomen:  Abdomen soft, NT/ND, + BS Extremities:  No edema   Data Reviewed:    Labs: Basic Metabolic Panel:  Recent Labs Lab 11/17/15 2102 11/17/15 2252  11/18/15 0309  NA 141 138 144  K 4.5 4.5 3.9  CL 105 107 111  CO2  --  23 24  GLUCOSE 150* 147* 122*  BUN 37* 30* 30*  CREATININE 1.60* 1.58* 1.64*  CALCIUM  --  8.9 8.6*   GFR Estimated Creatinine Clearance: 38.8 mL/min (by C-G formula based on Cr of 1.64). Liver Function Tests:  Recent Labs Lab 11/17/15 2252 11/18/15 0309  AST 27 16  ALT 9* 9*  ALKPHOS 77 75  BILITOT 0.5 0.4  PROT 6.6 6.4*  ALBUMIN 3.7 3.6   No results for input(s): LIPASE, AMYLASE in the last 168 hours. No results for input(s): AMMONIA in the last 168 hours. Coagulation profile No results for input(s): INR, PROTIME in the last 168 hours.  CBC:  Recent Labs Lab 11/17/15 2054 11/17/15 2102 11/18/15 0309  WBC 17.8*  --  13.2*  NEUTROABS 14.9*  --   --   HGB 9.8* 12.9* 10.2*  HCT 29.2* 38.0* 31.4*  MCV 87.7  --  89.2  PLT 195  --  146*   Cardiac Enzymes: No results for input(s): CKTOTAL, CKMB, CKMBINDEX, TROPONINI in the last 168 hours. BNP (last 3 results) No results for input(s): PROBNP in the last 8760 hours. CBG: No results for input(s): GLUCAP in the last 168 hours. D-Dimer: No results for input(s): DDIMER in the last 72 hours. Hgb A1c: No results for input(s): HGBA1C in the last 72 hours. Lipid Profile: No results for input(s): CHOL, HDL, LDLCALC, TRIG, CHOLHDL, LDLDIRECT in the last 72 hours. Thyroid function studies: No results for input(s): TSH, T4TOTAL, T3FREE, THYROIDAB in the last 72 hours.  Invalid input(s): FREET3 Anemia work up: No results for input(s): VITAMINB12, FOLATE, FERRITIN, TIBC, IRON, RETICCTPCT in the last 72 hours. Sepsis Labs:  Recent Labs Lab 11/17/15 2054 11/17/15 2101 11/18/15 0031 11/18/15 0309  WBC 17.8*  --   --  13.2*  LATICACIDVEN  --  1.77 0.46*  --    Microbiology Recent Results (from the past 240 hour(s))  MRSA PCR Screening     Status: None   Collection Time: 11/18/15  3:05 AM  Result Value Ref Range Status   MRSA by PCR NEGATIVE  NEGATIVE Final    Comment:        The GeneXpert MRSA Assay (FDA approved for NASAL specimens only), is one component of a comprehensive MRSA colonization surveillance program. It  is not intended to diagnose MRSA infection nor to guide or monitor treatment for MRSA infections.      Medications:   . aspirin EC  81 mg Oral Daily  . calcium-vitamin D  1 tablet Oral Daily  . cefTRIAXone (ROCEPHIN)  IV  1 g Intravenous Q24H  . donepezil  10 mg Oral QHS  . heparin  5,000 Units Subcutaneous 3 times per day  . memantine  10 mg Oral BID  . multivitamin with minerals  1 tablet Oral Daily  . QUEtiapine  50 mg Oral QHS  . sodium chloride flush  3 mL Intravenous Q12H  . tamsulosin  0.4 mg Oral Daily  . vitamin C  1,000 mg Oral Daily   Continuous Infusions:    Time spent: 15 min   LOS: 0 days   Marinda Elk  [facility] Pager [phone]  *Please refer to [email or web address], password [email or web address] to get updated schedule on who will round on this patient, as hospitalists switch teams weekly. If 7PM-7AM, please contact night-coverage at [email or web address], password [email or web address] for any overnight needs.  11/18/2015, 4:53 PM

## 2015-11-18 NOTE — Progress Notes (Signed)
[facility] PROGRESS NOTE    Progress Note   Cole RobertRichard J Schicker GNF:[id number] DOB: 11-01-1931 DOA: 11/17/2015 PCP: Clayborn HeronVictoria R Rankins, MD   Brief Narrative:   Cole Wilson is an 82 y.o. male past medical history significant for dementia and chronic renal disease, family brought him to the emergency room as he got acutely altered.  Assessment/Plan:   Sepsis due to urinary tract infection [facility](HCC): Patient had hematuria on admission. He relates difficulty urinating we'll place on Flomax. Change cefepime to Rocephin. Urine cultures and blood cultures are pending. Poorly documented I's and O's, will get daily weights try to get strict I's and O's. Continue IV fluid hydration. Has remained afebrile partners are stable.  Acute encephalopathy superimpose on Dementia without behavioral disturbance: Resolved seems to be back to baseline.  Normocytic Anemia Hemoglobin seems to be stable we'll continue to monitor will need further follow-up as an outpatient.  History of alcohol abuse: Monitor with serial protocol. DVT Prophylaxis - Lovenox ordered.  Family Communication:none Disposition Plan: Home in 2-3 days Code Status:     Code Status Orders        Start     Ordered   11/18/15 0113  Full code   Continuous     11/18/15 0114    Code Status History    Date Active Date Inactive Code Status Order ID Comments User Context   06/21/2015  1:21 PM 06/23/2015  3:29 PM Full Code [id number]  Dorothea OgleIskra M Myers, MD Inpatient    Advance Directive Documentation        Most Recent Value   Type of Advance Directive  Out of facility [name] (pink MOST or yellow form) [Form is not with patient. ]   Pre-existing out of facility [name] order (yellow form or pink MOST form)     "MOST" Form in Place?          IV Access:    Peripheral IV   Procedures and diagnostic studies:   No results found.   Medical Consultants:    None.  Anti-Infectives:   Anti-infectives    Start      Dose/Rate Route Frequency Ordered Stop   11/18/15 2200  vancomycin (VANCOCIN) 1,250 mg in sodium chloride 0.9 % 250 mL IVPB  Status:  Discontinued     1,250 mg 166.7 mL/hr over 90 Minutes Intravenous Every 24 hours 11/17/15 2245 11/18/15 0150   11/18/15 2200  ceFEPIme (MAXIPIME) 2 g in dextrose 5 % 50 mL IVPB     2 g 100 mL/hr over 30 Minutes Intravenous Every 24 hours 11/18/15 0433     11/18/15 0600  piperacillin-tazobactam (ZOSYN) IVPB 3.375 g  Status:  Discontinued     3.375 g 12.5 mL/hr over 240 Minutes Intravenous 3 times per day 11/17/15 2243 11/18/15 0150   11/18/15 0200  ceFEPIme (MAXIPIME) 2 g in dextrose 5 % 50 mL IVPB     2 g 100 mL/hr over 30 Minutes Intravenous  Once 11/18/15 0150 11/18/15 0255   11/17/15 2245  piperacillin-tazobactam (ZOSYN) IVPB 3.375 g     3.375 g 100 mL/hr over 30 Minutes Intravenous  Once 11/17/15 2231 11/17/15 2337   11/17/15 2245  vancomycin (VANCOCIN) IVPB 1000 mg/200 mL premix     1,000 mg 200 mL/hr over 60 Minutes Intravenous  Once 11/17/15 2231 11/18/15 0037      Subjective:    Cole Robertichard J Wilson he relates he has some difficulty urinating and some strain last several months prior to admission when  urinating. He relates this dysuria is improved.  Objective:    Filed Vitals:   11/18/15 0215 11/18/15 0250 11/18/15 0400 11/18/15 0600  BP: 109/58 127/39 116/46 92/41  Pulse: 62 71 66 74  Temp:  98 F (36.7 C) 98.4 F (36.9 C)   TempSrc:   Oral   Resp: Height:      Weight:  85.7 kg (188 lb 15 oz)    SpO2: 86% 98% 98% 93%    Intake/Output Summary (Last 24 hours) at 11/18/15 0850 Last data filed at 11/18/15 0600  Gross per 24 hour  Intake    470 ml  Output      0 ml  Net    470 ml   Filed Weights   11/17/15 2252 11/18/15 0250  Weight: 85.73 kg (189 lb) 85.7 kg (188 lb 15 oz)    Exam: Gen:  NAD Cardiovascular:  RRR. Chest and lungs:   Good air movement clear to auscultation. Abdomen:  Abdomen soft, NT/ND, +  BS Extremities:  No edema   Data Reviewed:    Labs: Basic Metabolic Panel:  Recent Labs Lab 11/17/15 2102 11/17/15 2252 11/18/15 0309  NA 141 138 144  K 4.5 4.5 3.9  CL 105 107 111  CO2  --  23 24  GLUCOSE 150* 147* 122*  BUN 37* 30* 30*  CREATININE 1.60* 1.58* 1.64*  CALCIUM  --  8.9 8.6*   GFR Estimated Creatinine Clearance: 38.8 mL/min (by C-G formula based on Cr of 1.64). Liver Function Tests:  Recent Labs Lab 11/17/15 2252 11/18/15 0309  AST 27 16  ALT 9* 9*  ALKPHOS 77 75  BILITOT 0.5 0.4  PROT 6.6 6.4*  ALBUMIN 3.7 3.6   No results for input(s): LIPASE, AMYLASE in the last 168 hours. No results for input(s): AMMONIA in the last 168 hours. Coagulation profile No results for input(s): INR, PROTIME in the last 168 hours.  CBC:  Recent Labs Lab 11/17/15 2054 11/17/15 2102 11/18/15 0309  WBC 17.8*  --  13.2*  NEUTROABS 14.9*  --   --   HGB 9.8* 12.9* 10.2*  HCT 29.2* 38.0* 31.4*  MCV 87.7  --  89.2  PLT 195  --  146*   Cardiac Enzymes: No results for input(s): CKTOTAL, CKMB, CKMBINDEX, TROPONINI in the last 168 hours. BNP (last 3 results) No results for input(s): PROBNP in the last 8760 hours. CBG: No results for input(s): GLUCAP in the last 168 hours. D-Dimer: No results for input(s): DDIMER in the last 72 hours. Hgb A1c: No results for input(s): HGBA1C in the last 72 hours. Lipid Profile: No results for input(s): CHOL, HDL, LDLCALC, TRIG, CHOLHDL, LDLDIRECT in the last 72 hours. Thyroid function studies: No results for input(s): TSH, T4TOTAL, T3FREE, THYROIDAB in the last 72 hours.  Invalid input(s): FREET3 Anemia work up: No results for input(s): VITAMINB12, FOLATE, FERRITIN, TIBC, IRON, RETICCTPCT in the last 72 hours. Sepsis Labs:  Recent Labs Lab 11/17/15 2054 11/17/15 2101 11/18/15 0031 11/18/15 0309  WBC 17.8*  --   --  13.2*  LATICACIDVEN  --  1.77 0.46*  --    Microbiology Recent Results (from the past 240 hour(s))   MRSA PCR Screening     Status: None   Collection Time: 11/18/15  3:05 AM  Result Value Ref Range Status   MRSA by PCR NEGATIVE NEGATIVE Final    Comment:        The GeneXpert MRSA Assay (FDA  approved for NASAL specimens only), is one component of a comprehensive MRSA colonization surveillance program. It is not intended to diagnose MRSA infection nor to guide or monitor treatment for MRSA infections.      Medications:   . aspirin EC  81 mg Oral Daily  . calcium-vitamin D  1 tablet Oral Daily  . ceFEPime (MAXIPIME) IV  2 g Intravenous Q24H  . donepezil  10 mg Oral QHS  . heparin  5,000 Units Subcutaneous 3 times per day  . memantine  10 mg Oral BID  . multivitamin with minerals  1 tablet Oral Daily  . QUEtiapine  50 mg Oral QHS  . sodium chloride flush  3 mL Intravenous Q12H  . vitamin C  1,000 mg Oral Daily   Continuous Infusions: . sodium chloride 100 mL/hr at 11/18/15 0148    Time spent: 25 min   LOS: 0 days   Marinda Elk  [facility] Pager [phone]  *Please refer to [email or web address], password [email or web address] to get updated schedule on who will round on this patient, as hospitalists switch teams weekly. If 7PM-7AM, please contact night-coverage at [email or web address], password [email or web address] for any overnight needs.  11/18/2015, 8:50 AM

## 2015-11-18 NOTE — ED Notes (Signed)
Called ICU for the 20 minute time to transport.

## 2015-11-19 DIAGNOSIS — G9340 Encephalopathy, unspecified: Secondary | ICD-10-CM | POA: Diagnosis not present

## 2015-11-19 DIAGNOSIS — F0390 Unspecified dementia without behavioral disturbance: Secondary | ICD-10-CM | POA: Diagnosis not present

## 2015-11-19 DIAGNOSIS — L8990 Pressure ulcer of unspecified site, unspecified stage: Secondary | ICD-10-CM | POA: Insufficient documentation

## 2015-11-19 DIAGNOSIS — A419 Sepsis, unspecified organism: Secondary | ICD-10-CM | POA: Diagnosis not present

## 2015-11-19 DIAGNOSIS — N183 Chronic kidney disease, stage 3 (moderate): Secondary | ICD-10-CM | POA: Diagnosis not present

## 2015-11-19 MED ORDER — HALOPERIDOL LACTATE 5 MG/ML IJ SOLN
2.0000 mg | Freq: Four times a day (QID) | INTRAMUSCULAR | Status: DC | PRN
  Administered 2015-11-19 (×3): 2 mg via INTRAVENOUS
  Filled 2015-11-19 (×4): qty 1

## 2015-11-19 MED ORDER — ENSURE ENLIVE PO LIQD
237.0000 mL | Freq: Two times a day (BID) | ORAL | Status: DC
  Administered 2015-11-19 – 2015-11-21 (×4): 237 mL via ORAL

## 2015-11-19 MED ORDER — HALOPERIDOL LACTATE 5 MG/ML IJ SOLN
2.0000 mg | Freq: Once | INTRAMUSCULAR | Status: AC
  Administered 2015-11-19: 2 mg via INTRAVENOUS

## 2015-11-19 NOTE — Progress Notes (Signed)
[facility] PROGRESS NOTE    Progress Note   Cole Wilson [name]:[id number] DOB: 1931-10-03 DOA: 11/17/2015 PCP: [name], MD   Brief Narrative:   Cole Wilson is an 82 y.o. male past medical history significant for dementia and chronic renal disease, family brought him to the emergency room as he got acutely altered.  Assessment/Plan:   Sepsis due to urinary tract infection [facility]): Patient had hematuria on admissionShe is now resolved. He was started on Flomax he related difficulty with voiding. Has remained afebrile on Rocephin urine cultures are negative till date.  Urine cultures and blood cultures are negative. Poorly documented I's and O's, will get daily weights try to get strict I's and O's. KVO IV fluids continue Rocephin and urine cultures are negative tomorrow, chemistries show normal regimen and discharge. PT consult.  Acute encephalopathy superimpose on Dementia without behavioral disturbance: Resolved seems to be back to baseline. Continue Seroquel night and Haldol for agitation when necessary.  Normocytic Anemia Hemoglobin seems to be stable we'll continue to monitor will need further follow-up as an outpatient.  History of alcohol abuse: No signs of withdrawal.  DVT Prophylaxis - Lovenox ordered.  Family Communication:none Disposition Plan: Home in am Code Status:     Code Status Orders        Start     Ordered   11/18/15 0113  Full code   Continuous     11/18/15 0114    Code Status History    Date Active Date Inactive Code Status Order ID Comments User Context   06/21/2015  1:21 PM 06/23/2015  3:29 PM Full Code [id number]  Dorothea Ogle, MD Inpatient    Advance Directive Documentation        Most Recent Value   Type of Advance Directive  Out of facility [name] (pink MOST or yellow form) [Form is not with patient. ]   Pre-existing out of facility [name] order (yellow form or pink MOST form)     "MOST" Form in Place?           IV Access:    Peripheral IV   Procedures and diagnostic studies:   No results found.   Medical Consultants:    None.  Anti-Infectives:   Anti-infectives    Start     Dose/Rate Route Frequency Ordered Stop   11/18/15 2200  vancomycin (VANCOCIN) 1,250 mg in sodium chloride 0.9 % 250 mL IVPB  Status:  Discontinued     1,250 mg 166.7 mL/hr over 90 Minutes Intravenous Every 24 hours 11/17/15 2245 11/18/15 0150   11/18/15 2200  ceFEPIme (MAXIPIME) 2 g in dextrose 5 % 50 mL IVPB  Status:  Discontinued     2 g 100 mL/hr over 30 Minutes Intravenous Every 24 hours 11/18/15 0433 11/18/15 0901   11/18/15 1000  cefTRIAXone (ROCEPHIN) 1 g in dextrose 5 % 50 mL IVPB     1 g 100 mL/hr over 30 Minutes Intravenous Every 24 hours 11/18/15 0901     11/18/15 0600  piperacillin-tazobactam (ZOSYN) IVPB 3.375 g  Status:  Discontinued     3.375 g 12.5 mL/hr over 240 Minutes Intravenous 3 times per day 11/17/15 2243 11/18/15 0150   11/18/15 0200  ceFEPIme (MAXIPIME) 2 g in dextrose 5 % 50 mL IVPB     2 g 100 mL/hr over 30 Minutes Intravenous  Once 11/18/15 0150 11/18/15 0255   11/17/15 2245  piperacillin-tazobactam (ZOSYN) IVPB 3.375 g     3.375 g 100  mL/hr over 30 Minutes Intravenous  Once 11/17/15 2231 11/17/15 2337   11/17/15 2245  vancomycin (VANCOCIN) IVPB 1000 mg/200 mL premix     1,000 mg 200 mL/hr over 60 Minutes Intravenous  Once 11/17/15 2231 11/18/15 0037      Subjective:    Cole Wilson is uncomfortable sitting in the chair I ambulate.  Objective:    Filed Vitals:   11/18/15 1000 11/18/15 1137 11/18/15 2024 11/19/15 0549  BP: 153/81 112/55 128/56 136/66  Pulse: 73 91 72 72  Temp:  100 F (37.8 C) 98.8 F (37.1 C) 99 F (37.2 C)  TempSrc:  Oral Oral Oral  Resp: 17 18 18 18   Height:      Weight:      SpO2: 99% 95% 100% 100%    Intake/Output Summary (Last 24 hours) at 11/19/15 1237 Last data filed at 11/18/15 2135  Gross per 24 hour  Intake      0  ml  Output    250 ml  Net   -250 ml   Filed Weights   11/17/15 2252 11/18/15 0250  Weight: 85.73 kg (189 lb) 85.7 kg (188 lb 15 oz)    Exam: Gen:  NAD Cardiovascular:  RRR. Chest and lungs:   Good air movement clear to auscultation. Abdomen:  Abdomen soft, NT/ND, + BS Extremities:  No edema   Data Reviewed:    Labs: Basic Metabolic Panel:  Recent Labs Lab 11/17/15 2102 11/17/15 2252 11/18/15 0309  NA 141 138 144  K 4.5 4.5 3.9  CL 105 107 111  CO2  --  23 24  GLUCOSE 150* 147* 122*  BUN 37* 30* 30*  CREATININE 1.60* 1.58* 1.64*  CALCIUM  --  8.9 8.6*   GFR Estimated Creatinine Clearance: 38.8 mL/min (by C-G formula based on Cr of 1.64). Liver Function Tests:  Recent Labs Lab 11/17/15 2252 11/18/15 0309  AST 27 16  ALT 9* 9*  ALKPHOS 77 75  BILITOT 0.5 0.4  PROT 6.6 6.4*  ALBUMIN 3.7 3.6   No results for input(s): LIPASE, AMYLASE in the last 168 hours. No results for input(s): AMMONIA in the last 168 hours. Coagulation profile No results for input(s): INR, PROTIME in the last 168 hours.  CBC:  Recent Labs Lab 11/17/15 2054 11/17/15 2102 11/18/15 0309  WBC 17.8*  --  13.2*  NEUTROABS 14.9*  --   --   HGB 9.8* 12.9* 10.2*  HCT 29.2* 38.0* 31.4*  MCV 87.7  --  89.2  PLT 195  --  146*   Cardiac Enzymes: No results for input(s): CKTOTAL, CKMB, CKMBINDEX, TROPONINI in the last 168 hours. BNP (last 3 results) No results for input(s): PROBNP in the last 8760 hours. CBG: No results for input(s): GLUCAP in the last 168 hours. D-Dimer: No results for input(s): DDIMER in the last 72 hours. Hgb A1c: No results for input(s): HGBA1C in the last 72 hours. Lipid Profile: No results for input(s): CHOL, HDL, LDLCALC, TRIG, CHOLHDL, LDLDIRECT in the last 72 hours. Thyroid function studies: No results for input(s): TSH, T4TOTAL, T3FREE, THYROIDAB in the last 72 hours.  Invalid input(s): FREET3 Anemia work up: No results for input(s): VITAMINB12,  FOLATE, FERRITIN, TIBC, IRON, RETICCTPCT in the last 72 hours. Sepsis Labs:  Recent Labs Lab 11/17/15 2054 11/17/15 2101 11/18/15 0031 11/18/15 0309  WBC 17.8*  --   --  13.2*  LATICACIDVEN  --  1.77 0.46*  --    Microbiology Recent Results (from the  past 240 hour(s))  MRSA PCR Screening     Status: None   Collection Time: 11/18/15  3:05 AM  Result Value Ref Range Status   MRSA by PCR NEGATIVE NEGATIVE Final    Comment:        The GeneXpert MRSA Assay (FDA approved for NASAL specimens only), is one component of a comprehensive MRSA colonization surveillance program. It is not intended to diagnose MRSA infection nor to guide or monitor treatment for MRSA infections.      Medications:   . aspirin EC  81 mg Oral Daily  . calcium-vitamin D  1 tablet Oral Daily  . cefTRIAXone (ROCEPHIN)  IV  1 g Intravenous Q24H  . donepezil  10 mg Oral QHS  . feeding supplement (ENSURE ENLIVE)  237 mL Oral BID BM  . heparin  5,000 Units Subcutaneous 3 times per day  . memantine  10 mg Oral BID  . multivitamin with minerals  1 tablet Oral Daily  . QUEtiapine  50 mg Oral QHS  . sodium chloride flush  3 mL Intravenous Q12H  . tamsulosin  0.4 mg Oral Daily  . vitamin C  1,000 mg Oral Daily   Continuous Infusions:    Time spent: 15 min   LOS: 1 day   Marinda Elk  [facility] Pager [phone]  *Please refer to [email or web address], password [email or web address] to get updated schedule on who will round on this patient, as hospitalists switch teams weekly. If 7PM-7AM, please contact night-coverage at [email or web address], password [email or web address] for any overnight needs.  11/19/2015, 12:37 PM

## 2015-11-19 NOTE — Care Management Obs Status (Signed)
MEDICARE OBSERVATION STATUS NOTIFICATION   Patient Details  Name: Cole RobertRichard J Faller MRN: [id number] Date of Birth: 09/08/1931   Medicare Observation Status Notification Given:  Yes    Golda AcreDavis, Jaquaya Coyle Lynn, RN 11/19/2015, 4:31 PM

## 2015-11-19 NOTE — Progress Notes (Signed)
Initial Nutrition Assessment  DOCUMENTATION CODE: Not applicable.   INTERVENTION:  - Provide Ensure Enlive BID. Each supplement provides 350 kcals and 20 grams of protein.  - Continue to monitor for nutritional needs   NUTRITION DIAGNOSIS:   Increased nutrient needs related to wound healing as evidenced by estimated needs.  GOAL:   Patient will meet greater than or equal to 90% of their needs  MONITOR:   PO intake, Supplement acceptance, Labs, Weight trends, Skin, I & O's  REASON FOR ASSESSMENT:   Malnutrition Screening Tool    ASSESSMENT:   82 year old male with a past medical history significant for dementia and CKD; who presents with complaints of being acutely altered from baseline. History is obtained from the wife as the patient has history of dementia. At baseline he is able to complete all of his ADLs without assistance and only really has problems with his sequence of events and dates for the most part. Denies that he any significant history of hypertension or heart disease. She notes that his symptoms started initially 3 days ago with him complaining of some burning with urination. She also noted a little blood stained in his underwear. However, yesterday (4/2) he was acutely and more confused and disoriented. She notes that he had difficulty finding the bathroom and their are small apartment, he tried putting his shirt on his legs, had difficulty getting up from a chair, and was unsteady with a shuffling gait. All these things are very abnormal for him. She states that this is happened 2 times over the last year or so which led her to think he had a urinary tract infection and brought him into the hospital for further evaluation. The only new medication that was recently started was Seroquel, which she states has significantly helped with symptoms of night wandering that he was having. The patient himself denies any complaints at this time   Pt seen for MST. Pt with BMI  categorized as  overweight. Per chart, pt has had an 8.7% weight loss since 11/16. This is significant for time frame. Pt has a stage 1 pressure ulcer on sacrum.  Pt with dementia and not able to provide accurate information about diet and weight. Spoke to RN who said he is confused and agitated but very verbal. Did not obtain an accurate dietary recall. Pt reports eating TID and a good appetite PTA. Pt denies N/V and abdominal pain associated with eating. Pt occasionally drinks Ensure at home when "he feels like it".   Unable to perform NFPE d/t agitated state. Will attempt at follow up. Visually, pt did not seem to have any muscle or fat depletion in the upper body or face.   Labs reviewed; BUN 30 mg/dl,creatinine 1.61 mg/dl, Ca 8.6 mg/dl, GFR 38 ml/min, glucose 122 mg/dl,    Medications reviewed; IVF NS 100 ml/hr, MVI, Ca-Vit D 500-200 mg, Rocephin 1 g in D5, Vitamin C 1000 mg.   Diet Order:  Diet Heart Room service appropriate?: Yes; Fluid consistency:: Thin  Skin:  Wound (see comment) (Pressure Ulcer stage 1 on sacrum)  Height:   Ht Readings from Last 1 Encounters:  11/17/15 6' (1.829 m)    Weight:   Wt Readings from Last 1 Encounters:  11/18/15 188 lb 15 oz (85.7 kg)    Ideal Body Weight:  80.1 kg  BMI:  Body mass index is 25.62 kg/(m^2).  Estimated Nutritional Needs:   Kcal:  1800-2000 (21-24 kcal/kg)  Protein:  85-95 g (1-1.1 g/kg)  Fluid:  1.8-2.0 L  EDUCATION NEEDS:   No education needs identified at this time  Beryle QuantMeredith Tiphanie Vo, MS [facility] Dietetic Intern Pager [phone]

## 2015-11-20 DIAGNOSIS — A419 Sepsis, unspecified organism: Secondary | ICD-10-CM | POA: Diagnosis not present

## 2015-11-20 DIAGNOSIS — G9340 Encephalopathy, unspecified: Secondary | ICD-10-CM | POA: Diagnosis not present

## 2015-11-20 DIAGNOSIS — F0390 Unspecified dementia without behavioral disturbance: Secondary | ICD-10-CM | POA: Diagnosis not present

## 2015-11-20 DIAGNOSIS — N183 Chronic kidney disease, stage 3 (moderate): Secondary | ICD-10-CM | POA: Diagnosis not present

## 2015-11-20 LAB — URINE CULTURE

## 2015-11-20 NOTE — Evaluation (Signed)
Physical Therapy Evaluation Patient Details Name: Cole Wilson MRN: [id number] DOB: 1932-07-15 Today's Date: 11/20/2015   History of Present Illness  Cole Wilson is an 82 y.o. male past medical history significant for dementia and chronic renal disease, family brought him to the emergency room as he got acutely altered  Clinical Impression  Pt admitted with above diagnosis. Pt currently with functional limitations due to the deficits listed below (see PT Problem List).  Pt will benefit from skilled PT to increase their independence and safety with mobility to allow discharge to the venue listed below.   Recommend SNF to allow pt to return to more I level and allow for eventual  safe transition to home with wife support    Follow Up Recommendations SNF;Supervision/Assistance - 24 hour    Equipment Recommendations  None recommended by PT    Recommendations for Other Services       Precautions / Restrictions Precautions Precautions: Fall      Mobility  Bed Mobility Overal bed mobility: Needs Assistance Bed Mobility: Supine to Sit     Supine to sit: HOB elevated;Min assist     General bed mobility comments: incr time, effortful transition; assist with trunk, cues for use of UEs to self assist  Transfers Overall transfer level: Needs assistance Equipment used: Rolling walker (2 wheeled) Transfers: Sit to/from Stand Sit to Stand: Mod assist         General transfer comment: cues for wt shift to stand, assist to rise and stabilize from slightly elevated bed  Ambulation/Gait Ambulation/Gait assistance: Min assist Ambulation Distance (Feet): 110 Feet Assistive device: 1 person hand held assist;None Gait Pattern/deviations: Step-through pattern;Decreased stride length;Drifts right/left     General Gait Details: assist for balance  and cues for safety with turns and obstacle negotiation  Stairs            Wheelchair Mobility    Modified Rankin  (Stroke Patients Only)       Balance Overall balance assessment: Needs assistance Sitting-balance support: Feet supported;No upper extremity supported Sitting balance-Leahy Scale: Fair       Standing balance-Leahy Scale: Poor Standing balance comment: requires support to safely maintain balance                             Pertinent Vitals/Pain Pain Assessment: No/denies pain    Home Living Family/patient expects to be discharged to:: Private residence Living Arrangements: Spouse/significant other   Type of Home: Independent living facility [facility])       Home Layout: One level Home Equipment: None      Prior Function Level of Independence: Independent               Hand Dominance        Extremity/Trunk Assessment   Upper Extremity Assessment: Overall WFL for tasks assessed           Lower Extremity Assessment: Overall WFL for tasks assessed         Communication   Communication: No difficulties  Cognition Arousal/Alertness: Awake/alert Behavior During Therapy: WFL for tasks assessed/performed Overall Cognitive Status: Impaired/Different from baseline Area of Impairment: Problem solving             Problem Solving: Slow processing;Requires verbal cues;Requires tactile cues      General Comments      Exercises        Assessment/Plan    PT Assessment Patient needs continued PT services  PT Diagnosis Difficulty walking   PT Problem List Decreased activity tolerance;Decreased balance;Decreased mobility;Decreased safety awareness;Decreased cognition  PT Treatment Interventions DME instruction;Gait training;Functional mobility training;Therapeutic exercise;Therapeutic activities;Patient/family education   PT Goals (Current goals can be found in the Care Plan section) Acute Rehab PT Goals Patient Stated Goal[place] [facility] PT Goal Formulation: With family Time For Goal Achievement:  12/04/15 Potential to Achieve Goals: Good    Frequency Min 3X/week   Barriers to discharge        Co-evaluation               End of Session Equipment Utilized During Treatment: Gait belt Activity Tolerance: Patient tolerated treatment well Patient left: in chair;with call bell/phone within reach;with chair alarm set;with family/visitor present;with nursing/sitter in room      Functional Assessment Tool Used: clinical judgement Functional Limitation: Mobility: Walking and moving around Mobility: Walking and Moving Around Current Status [phone]): At least 1 percent but less than 20 percent impaired, limited or restricted Mobility: Walking and Moving Around Goal Status [phone]): At least 1 percent but less than 20 percent impaired, limited or restricted    Time: 1122-1136 PT Time Calculation (min) (ACUTE ONLY): 14 min   Charges:   PT Evaluation $PT Eval Moderate Complexity: 1 Procedure     PT G Codes:   PT G-Codes **NOT FOR INPATIENT CLASS** Functional Assessment Tool Used: clinical judgement Functional Limitation: Mobility: Walking and moving around Mobility: Walking and Moving Around Current Status ([phone]): At least 1 percent but less than 20 percent impaired, limited or restricted Mobility: Walking and Moving Around Goal Status [phone]([phone]): At least 1 percent but less than 20 percent impaired, limited or restricted    [facility] HospitalWILLIAMS,Legna Mausolf 11/20/2015, 1:26 PM

## 2015-11-20 NOTE — Clinical Social Work Placement (Signed)
   CLINICAL SOCIAL WORK PLACEMENT  NOTE  Date:  11/20/2015  Patient Details  Name: Denyce RobertRichard J Conard MRN: [id number] Date of Birth: Jul 15, 1932  Clinical Social Work is seeking post-discharge placement for this patient at the Skilled  Nursing Facility level of care (*CSW will initial, date and re-position this form in  chart as items are completed):  Yes   Patient/family provided with [facility] Clinical Social Work Department's list of facilities offering this level of care within the geographic area requested by the patient (or if unable, by the patient's family).  Yes   Patient/family informed of their freedom to choose among providers that offer the needed level of care, that participate in Medicare, Medicaid or managed care program needed by the patient, have an available bed and are willing to accept the patient.  Yes   Patient/family informed of [facility] ownership interest in [facility] and [facility], as well as of the fact that they are under no obligation to receive care at these facilities.  PASRR submitted to EDS on 11/20/15     PASRR number received on 11/20/15     Existing PASRR number confirmed on       FL2 transmitted to all facilities in geographic area requested by pt/family on 11/20/15     FL2 transmitted to all facilities within larger geographic area on       Patient informed that his/her managed care company has contracts with or will negotiate with certain facilities, including the following:        Yes   Patient/family informed of bed offers received.  [facility]     Physician recommends and patient chooses bed at      [facility] on  .  Patient to be transferred to facility by       Patient family notified on   of transfer.  Name of family member notified:        PHYSICIAN Please sign FL2     Additional Comment:     _______________________________________________ Orson EvaKIDD, [place] A, LCSW 11/20/2015, 3:30 PM

## 2015-11-20 NOTE — Progress Notes (Signed)
[facility] PROGRESS NOTE    Progress Note   Cole Wilson [name]:[id number] DOB: August 20, 1931 DOA: 11/17/2015 PCP: [name], MD   Brief Narrative:   Cole Wilson is an 82 y.o. male past medical history significant for dementia and chronic renal disease, family brought him to the emergency room as he got acutely altered.  Assessment/Plan:   Sepsis due to urinary tract infection [facility]): Patient had hematuria on admission, it is now resolved. He was started on Flomax he related difficulty with voiding. Has remained afebrile on Rocephin urine cultures are negative till date.  Urine cultures grww E. Coli > 100k, sensitivities pending blood cultures are negative. PT consult pending.  Acute encephalopathy superimpose on Dementia without behavioral disturbance: Resolved seems to be back to baseline. Continue Seroquel night and Haldol for agitation when necessary.  Normocytic Anemia Hemoglobin seems to be stable we'll continue to monitor will need further follow-up as an outpatient.  History of alcohol abuse: No signs of withdrawal.  DVT Prophylaxis - Lovenox ordered.  Family Communication:none Disposition Plan: Home in 1 day Code Status:     Code Status Orders        Start     Ordered   11/18/15 0113  Full code   Continuous     11/18/15 0114    Code Status History    Date Active Date Inactive Code Status Order ID Comments User Context   06/21/2015  1:21 PM 06/23/2015  3:29 PM Full Code [id number]  Dorothea Ogle, MD Inpatient    Advance Directive Documentation        Most Recent Value   Type of Advance Directive  Out of facility [name] (pink MOST or yellow form) [Form is not with patient. ]   Pre-existing out of facility [name] order (yellow form or pink MOST form)     "MOST" Form in Place?          IV Access:    Peripheral IV   Procedures and diagnostic studies:   No results found.   Medical Consultants:    None.  Anti-Infectives:    Anti-infectives    Start     Dose/Rate Route Frequency Ordered Stop   11/18/15 2200  vancomycin (VANCOCIN) 1,250 mg in sodium chloride 0.9 % 250 mL IVPB  Status:  Discontinued     1,250 mg 166.7 mL/hr over 90 Minutes Intravenous Every 24 hours 11/17/15 2245 11/18/15 0150   11/18/15 2200  ceFEPIme (MAXIPIME) 2 g in dextrose 5 % 50 mL IVPB  Status:  Discontinued     2 g 100 mL/hr over 30 Minutes Intravenous Every 24 hours 11/18/15 0433 11/18/15 0901   11/18/15 1000  cefTRIAXone (ROCEPHIN) 1 g in dextrose 5 % 50 mL IVPB     1 g 100 mL/hr over 30 Minutes Intravenous Every 24 hours 11/18/15 0901     11/18/15 0600  piperacillin-tazobactam (ZOSYN) IVPB 3.375 g  Status:  Discontinued     3.375 g 12.5 mL/hr over 240 Minutes Intravenous 3 times per day 11/17/15 2243 11/18/15 0150   11/18/15 0200  ceFEPIme (MAXIPIME) 2 g in dextrose 5 % 50 mL IVPB     2 g 100 mL/hr over 30 Minutes Intravenous  Once 11/18/15 0150 11/18/15 0255   11/17/15 2245  piperacillin-tazobactam (ZOSYN) IVPB 3.375 g     3.375 g 100 mL/hr over 30 Minutes Intravenous  Once 11/17/15 2231 11/17/15 2337   11/17/15 2245  vancomycin (VANCOCIN) IVPB 1000 mg/200 mL premix  1,000 mg 200 mL/hr over 60 Minutes Intravenous  Once 11/17/15 2231 11/18/15 0037      Subjective:    Cole Wilson agitated overnight  Objective:    Filed Vitals:   11/18/15 2024 11/19/15 0549 11/19/15 2113 11/20/15 0623  BP: 128/56 136/66 136/58 128/58  Pulse: 72 72 81 63  Temp: 98.8 F (37.1 C) 99 F (37.2 C) 99.3 F (37.4 C) 97.6 F (36.4 C)  TempSrc: Oral Oral Oral Oral  Resp: Height:      Weight:      SpO2: 100% 100%      Intake/Output Summary (Last 24 hours) at 11/20/15 0925 Last data filed at 11/20/15 0700  Gross per 24 hour  Intake    210 ml  Output    825 ml  Net   -615 ml   Filed Weights   11/17/15 2252 11/18/15 0250  Weight: 85.73 kg (189 lb) 85.7 kg (188 lb 15 oz)    Exam: Gen:   NAD Cardiovascular:  RRR. Chest and lungs:   Good air movement clear to auscultation. Abdomen:  Abdomen soft, NT/ND, + BS Extremities:  No edema   Data Reviewed:    Labs: Basic Metabolic Panel:  Recent Labs Lab 11/17/15 2102 11/17/15 2252 11/18/15 0309  NA 141 138 144  K 4.5 4.5 3.9  CL 105 107 111  CO2  --  23 24  GLUCOSE 150* 147* 122*  BUN 37* 30* 30*  CREATININE 1.60* 1.58* 1.64*  CALCIUM  --  8.9 8.6*   GFR Estimated Creatinine Clearance: 38.8 mL/min (by C-G formula based on Cr of 1.64). Liver Function Tests:  Recent Labs Lab 11/17/15 2252 11/18/15 0309  AST 27 16  ALT 9* 9*  ALKPHOS 77 75  BILITOT 0.5 0.4  PROT 6.6 6.4*  ALBUMIN 3.7 3.6   No results for input(s): LIPASE, AMYLASE in the last 168 hours. No results for input(s): AMMONIA in the last 168 hours. Coagulation profile No results for input(s): INR, PROTIME in the last 168 hours.  CBC:  Recent Labs Lab 11/17/15 2054 11/17/15 2102 11/18/15 0309  WBC 17.8*  --  13.2*  NEUTROABS 14.9*  --   --   HGB 9.8* 12.9* 10.2*  HCT 29.2* 38.0* 31.4*  MCV 87.7  --  89.2  PLT 195  --  146*   Cardiac Enzymes: No results for input(s): CKTOTAL, CKMB, CKMBINDEX, TROPONINI in the last 168 hours. BNP (last 3 results) No results for input(s): PROBNP in the last 8760 hours. CBG: No results for input(s): GLUCAP in the last 168 hours. D-Dimer: No results for input(s): DDIMER in the last 72 hours. Hgb A1c: No results for input(s): HGBA1C in the last 72 hours. Lipid Profile: No results for input(s): CHOL, HDL, LDLCALC, TRIG, CHOLHDL, LDLDIRECT in the last 72 hours. Thyroid function studies: No results for input(s): TSH, T4TOTAL, T3FREE, THYROIDAB in the last 72 hours.  Invalid input(s): FREET3 Anemia work up: No results for input(s): VITAMINB12, FOLATE, FERRITIN, TIBC, IRON, RETICCTPCT in the last 72 hours. Sepsis Labs:  Recent Labs Lab 11/17/15 2054 11/17/15 2101 11/18/15 0031 11/18/15 0309  WBC  17.8*  --   --  13.2*  LATICACIDVEN  --  1.77 0.46*  --    Microbiology Recent Results (from the past 240 hour(s))  Blood Culture (routine x 2)     Status: None (Preliminary result)   Collection Time: 11/17/15  8:40 PM  Result Value Ref Range Status  Specimen Description BLOOD RIGHT ANTECUBITAL  Final   Special Requests BOTTLES DRAWN AEROBIC AND ANAEROBIC 5ML  Final   Culture   Final    NO GROWTH 1 DAY Performed at [facility]    Report Status PENDING  Incomplete  Blood Culture (routine x 2)     Status: None (Preliminary result)   Collection Time: 11/17/15  8:45 PM  Result Value Ref Range Status   Specimen Description BLOOD LEFT ANTECUBITAL  Final   Special Requests BOTTLES DRAWN AEROBIC AND ANAEROBIC 5ML  Final   Culture   Final    NO GROWTH 1 DAY Performed at [facility]    Report Status PENDING  Incomplete  Urine culture     Status: None (Preliminary result)   Collection Time: 11/17/15 10:33 PM  Result Value Ref Range Status   Specimen Description URINE, CATHETERIZED  Final   Special Requests NONE  Final   Culture   Final    >=100,000 COLONIES/mL ESCHERICHIA COLI Performed at [facility]    Report Status PENDING  Incomplete  MRSA PCR Screening     Status: None   Collection Time: 11/18/15  3:05 AM  Result Value Ref Range Status   MRSA by PCR NEGATIVE NEGATIVE Final    Comment:        The GeneXpert MRSA Assay (FDA approved for NASAL specimens only), is one component of a comprehensive MRSA colonization surveillance program. It is not intended to diagnose MRSA infection nor to guide or monitor treatment for MRSA infections.      Medications:   . aspirin EC  81 mg Oral Daily  . calcium-vitamin D  1 tablet Oral Daily  . cefTRIAXone (ROCEPHIN)  IV  1 g Intravenous Q24H  . donepezil  10 mg Oral QHS  . feeding supplement (ENSURE ENLIVE)  237 mL Oral BID BM  . heparin  5,000 Units Subcutaneous 3 times per day  . memantine  10 mg Oral BID   . multivitamin with minerals  1 tablet Oral Daily  . QUEtiapine  50 mg Oral QHS  . sodium chloride flush  3 mL Intravenous Q12H  . tamsulosin  0.4 mg Oral Daily  . vitamin C  1,000 mg Oral Daily   Continuous Infusions:    Time spent: 15 min   LOS: 2 days   Marinda ElkFELIZ ORTIZ, ABRAHAM  [facility] Pager [phone]  *Please refer to [email or web address], password [email or web address] to get updated schedule on who will round on this patient, as hospitalists switch teams weekly. If 7PM-7AM, please contact night-coverage at [email or web address], password [email or web address] for any overnight needs.  11/20/2015, 9:25 AM

## 2015-11-20 NOTE — Clinical Social Work Note (Signed)
Clinical Social Work Assessment  Patient Details  Name: Cole Wilson MRN: [id number] Date of Birth: 13-Mar-1932  Date of referral:  11/20/15               Reason for consult:  Discharge Planning                Permission sought to share information with:  Family Supports Permission granted to share information::  Yes, Verbal Permission Granted  Name::     Cole Wilson  Agency::     Relationship::  wife  Contact Information:  [phone]  Housing/Transportation Living arrangements for the past 2 months:  Independent Living Facility Source of Information:  Spouse Patient Interpreter Needed:  None Criminal Activity/Legal Involvement Pertinent to Current Situation/Hospitalization:  No - Comment as needed Significant Relationships:  Spouse Lives with:  Spouse Do you feel safe going back to the place where you live?  No Need for family participation in patient care:  Yes (Comment)  Care giving concerns:  [facility] where [facility] with wife. PT recommending rehab at SNF level of care of [facility].    Social Worker assessment / plan:  CSW received referral for New SNF.   CSW visited pt room. Pt wife not present at this time. Pt has dementia with memory impairment. CSW contacted pt wife, Cole Wilson via telephone. CSW introduced self and explained role. Pt wife confirmed wishes for pt to go to [facility]. Per pt wife, pt wife understands that pt is observation status and plan is for discharge tomorrow.  CSW contacted [facility] and confirmed that facility can accept pt in rehab section tomorrow. CSW completed FL2 and sent clinical information to [facility].  CSW to continue to follow to provide support and assist with pt transition to [facility] tomorrow.  Employment status:  Retired Database administratornsurance information:  Managed Medicare PT Recommendations:  Skilled Nursing Facility Information /  Referral to community resources:  Skilled Nursing Facility  Patient/Family's Response to care:  Pt with dementia and memory impairment. Pt wife supportive and actively involved in pt care. Pt wife agrees that rehab at [facility] is best plan upon discharge.  Patient/Family's Understanding of and Emotional Response to Diagnosis, Current Treatment, and Prognosis:  Pt wife displayed knowledge surrounding pt diagnosis and treatment plan.   Emotional Assessment Appearance:  Appears stated age Attitude/Demeanor/Rapport:  Unable to Assess (dementia with memory impairment) Affect (typically observed):  Other (dementia with memory impairtment-assessment completed with pt wife) Orientation:  Oriented to Self, Oriented to Place, Oriented to  Time, Oriented to Situation (memory impairment) Alcohol / Substance use:  Not Applicable Psych involvement (Current and /or in the community):  No (Comment)  Discharge Needs  Concerns to be addressed:  Discharge Planning Concerns Readmission within the last 30 days:  No Current discharge risk:  Physical Impairment Barriers to Discharge:  Continued Medical Work up   Orson EvaKIDD, Sanjuan Sawa A, LCSW 11/20/2015, 3:24 PM [phone]

## 2015-11-20 NOTE — Care Management Note (Signed)
Case Management Note  Patient Details  Name: Cole Wilson MRN: [id number] Date of Birth: 1932-06-05  Subjective/Objective:   82 y.o. M admitted 11/17/2015 [place] lives with [place] wife. Sepsis, UTI. Continues on IV Abx at present. All else back to baseline.                  Action/Plan:[facility] at the [facility]F prior to [place]. CSW following.    Expected Discharge Date:   (unknown)               Expected Discharge Plan:  Skilled Nursing Facility  In-House Referral:  NA, Clinical Social Work  Discharge planning Services  CM Consult  Post Acute Care Choice:  NA Choice offered to:  Spouse  DME Arranged:    DME Agency:     HH Arranged:    HH Agency:     Status of Service:  In process, will continue to follow  Medicare Important Message Given:    Date Medicare IM Given:    Medicare IM give by:    Date Additional Medicare IM Given:    Additional Medicare Important Message give by:     If discussed at Long Length of Stay Meetings, dates discussed:    Additional Comments:  Yvone NeuCrutchfield, [name], RN 11/20/2015, 1:48 PM

## 2015-11-20 NOTE — NC FL2 (Signed)
[place] MEDICAID FL2 LEVEL OF CARE SCREENING TOOL     IDENTIFICATION  Patient Name: Cole Wilson Birthdate: 08-06-1932 Sex: male Admission Date (Current Location): 11/17/2015  [facility] and [place] Number:  [place], television[place]film[place]video and Address:  [facility],  [address] [place], [place] [address]      Provider Number: [id number]  Attending Physician Name and Address:  Marinda Elk, MD  Relative Name and Phone Number:       Current Level of Care: Hospital Recommended Level of Care: Skilled Nursing Facility Prior Approval Number:    Date Approved/Denied:   PASRR Number: [id number]  Discharge Plan: SNF    Current Diagnoses: Patient Active Problem List   Diagnosis Date Noted  . Pressure ulcer 11/19/2015  . Sepsis due to urinary tract infection (HCC) 11/18/2015  . Hypotension 11/18/2015  . Anemia 11/18/2015  . Acute encephalopathy 11/18/2015  . CKD (chronic kidney disease), stage III 11/18/2015  . Dementia without behavioral disturbance 06/22/2015  . Acute pancreatitis 06/21/2015  . Sepsis (HCC) 06/21/2015  . Transaminitis 06/21/2015  . Alcohol use (HCC) 06/21/2015  . Acute renal failure (HCC) 06/21/2015  . Leucocytosis 03/20/2012    Orientation RESPIRATION BLADDER Height & Weight     Self, Time, Situation, Place  Normal Incontinent Weight: 188 lb 15 oz (85.7 kg) Height:  6' (182.9 cm)  BEHAVIORAL SYMPTOMS/MOOD NEUROLOGICAL BOWEL NUTRITION STATUS  Other (Comment) (Dementia without behavioral disturbance)  (NONE) Continent Diet (Diet Heart)  AMBULATORY STATUS COMMUNICATION OF NEEDS Skin   Limited Assist Verbally PU Stage and Appropriate Care (Stage I to Sacrum) PU Stage 1 Dressing:  (Silicone Dressing (allevyn) PRN)                     Personal Care Assistance Level of Assistance  Bathing, Feeding, Dressing Bathing Assistance: Limited assistance Feeding assistance: Independent Dressing Assistance: Limited assistance     Functional  Limitations Info  Sight, Hearing, Speech Sight Info: Adequate Hearing Info: Adequate Speech Info: Adequate    SPECIAL CARE FACTORS FREQUENCY  PT (By licensed PT)     PT Frequency: 5 x a week              Contractures Contractures Info: Not present    Additional Factors Info  Code Status, Allergies, Psychotropic Code Status Info: FULL code status Allergies Info: No Known Allergies Psychotropic Info: Aricept, Namenda, Seroquel         Current Medications (11/20/2015):  This is the current hospital active medication list Current Facility-Administered Medications  Medication Dose Route Frequency Provider Last Rate Last Dose  . acetaminophen (TYLENOL) tablet 650 mg  650 mg Oral Q6H PRN Clydie Braun, MD   650 mg at 11/18/15 1242   Or  . acetaminophen (TYLENOL) suppository 650 mg  650 mg Rectal Q6H PRN Clydie Braun, MD      . albuterol (PROVENTIL) (2.5 MG/3ML) 0.083% nebulizer solution 2.5 mg  2.5 mg Nebulization Q2H PRN Clydie Braun, MD      . aspirin EC tablet 81 mg  81 mg Oral Daily Clydie Braun, MD   81 mg at 11/20/15 1056  . calcium-vitamin D (OSCAL WITH D) 500-200 MG-UNIT per tablet 1 tablet  1 tablet Oral Daily Clydie Braun, MD   1 tablet at 11/20/15 1056  . cefTRIAXone (ROCEPHIN) 1 g in dextrose 5 % 50 mL IVPB  1 g Intravenous Q24H Marinda Elk, MD   1 g at 11/20/15 1057  .  donepezil (ARICEPT) tablet 10 mg  10 mg Oral QHS Marinda ElkAbraham Feliz Ortiz, MD   10 mg at 11/19/15 2111  . feeding supplement (ENSURE ENLIVE) (ENSURE ENLIVE) liquid 237 mL  237 mL Oral BID BM Anderson MaltaJessica M Ostheim, RD   237 mL at 11/20/15 1058  . haloperidol lactate (HALDOL) injection 2 mg  2 mg Intravenous Q6H PRN Marinda ElkAbraham Feliz Ortiz, MD   2 mg at 11/19/15 2327  . heparin injection 5,000 Units  5,000 Units Subcutaneous 3 times per day Clydie Braunondell A Smith, MD   5,000 Units at 11/20/15 0604  . memantine (NAMENDA) tablet 10 mg  10 mg Oral BID Clydie Braunondell A Smith, MD   10 mg at 11/20/15 1057  .  multivitamin with minerals tablet 1 tablet  1 tablet Oral Daily Clydie Braunondell A Smith, MD   1 tablet at 11/20/15 1056  . QUEtiapine (SEROQUEL) tablet 50 mg  50 mg Oral QHS Clydie Braunondell A Smith, MD   50 mg at 11/19/15 2111  . sodium chloride flush (NS) 0.9 % injection 3 mL  3 mL Intravenous Q12H Rondell Burtis JunesA Smith, MD   3 mL at 11/20/15 1057  . tamsulosin (FLOMAX) capsule 0.4 mg  0.4 mg Oral Daily Marinda ElkAbraham Feliz Ortiz, MD   0.4 mg at 11/20/15 1057  . vitamin C (ASCORBIC ACID) tablet 1,000 mg  1,000 mg Oral Daily Clydie Braunondell A Smith, MD   1,000 mg at 11/20/15 1056     Discharge Medications: Please see discharge summary for a list of discharge medications.  Relevant Imaging Results:  Relevant Lab Results:   Additional Information SSN: [id number]  KIDD, SUZANNA A, LCSW

## 2015-11-21 DIAGNOSIS — A419 Sepsis, unspecified organism: Secondary | ICD-10-CM

## 2015-11-21 DIAGNOSIS — A4151 Sepsis due to Escherichia coli [E. coli]: Secondary | ICD-10-CM | POA: Diagnosis not present

## 2015-11-21 DIAGNOSIS — G9340 Encephalopathy, unspecified: Secondary | ICD-10-CM

## 2015-11-21 DIAGNOSIS — N183 Chronic kidney disease, stage 3 (moderate): Secondary | ICD-10-CM

## 2015-11-21 DIAGNOSIS — N390 Urinary tract infection, site not specified: Secondary | ICD-10-CM

## 2015-11-21 MED ORDER — TAMSULOSIN HCL 0.4 MG PO CAPS
0.4000 mg | ORAL_CAPSULE | Freq: Every day | ORAL | Status: AC
Start: 2015-11-21 — End: ?

## 2015-11-21 MED ORDER — AMOXICILLIN 500 MG PO CAPS: 500.0000 mg | ORAL_CAPSULE | Freq: Two times a day (BID) | ORAL | Status: DC

## 2015-11-21 MED ORDER — ENSURE ENLIVE PO LIQD: 237.0000 mL | Freq: Two times a day (BID) | ORAL | Status: DC

## 2015-11-21 MED ORDER — AMOXICILLIN 500 MG PO CAPS
500.0000 mg | ORAL_CAPSULE | Freq: Two times a day (BID) | ORAL | Status: DC
  Administered 2015-11-21: 500 mg via ORAL
  Filled 2015-11-21 (×2): qty 1

## 2015-11-21 NOTE — Discharge Summary (Signed)
Physician Discharge Summary  Cole Wilson ZHY:[id number] DOB: [birth year] DOA: 11/17/2015  PCP: Cole Heron, MD  Admit date: 11/17/2015 Discharge date: 11/21/2015  Time spent: 50 minutes  Recommendations for Outpatient Follow-up:  1. Post void residuals - outpt Urology eval recommended 2. Last dose of Amoxil on evening of 4/8  Discharge Condition: stable    Discharge Diagnoses:  Principal Problem:   Sepsis due to urinary tract infection (HCC) Active Problems:   Sepsis (HCC)   Dementia without behavioral disturbance   Hypotension   Anemia   Acute encephalopathy   CKD (chronic kidney disease), stage III   Pressure ulcer  Alcohol use in the past  History of present illness:  Cole Wilson is a 82 year old male with a past medical history significant for dementia and CKD brought in from home by wife due to 3 days of a change in mental status with burining with urination. At baseline his is able to complete his ADLs and began to have trouble with this over the past 3 days. Had similar episodes in the past related to UTIs.   Hospital Course:  Sepsis- fever 102, BP 87/55, WBC 17.8, acute encephalopathy - due to UTI - culture reveals E coli sensitive to Amoxil to which he has been switched today from Rocephin- will need 7 day course - improvement in WBC noted- see labs below - blood cultures negative - he wife states that his mental status has improved significantly but not completely- patient is pleasant, calm, communicative and able to answer questions  Likely BPH - causing UTIs - Flomax- follow post void residuals at SNF  Dementia - cont Aricept, Namenda and Seroquel (which has help with nighttime wakefulness, wandering)  CKD 3-4 - stable    Discharge Exam: Filed Weights   11/17/15 2252 11/18/15 0250  Weight: 85.73 kg (189 lb) 85.7 kg (188 lb 15 oz)   Filed Vitals:   11/21/15 0522 11/21/15 0800  BP: 132/64 107/56  Pulse: 64 74  Temp: 98.1 F (36.7 C) 98.1  F (36.7 C)  Resp: 18     General: Alert, no distress Cardiovascular: RRR, no murmurs  Respiratory: clear to auscultation bilaterally GI: soft, non-tender, non-distended, bowel sound positive  Discharge Instructions You were cared for by a hospitalist during your hospital stay. If you have any questions about your discharge medications or the care you received while you were in the hospital after you are discharged, you can call the unit and asked to speak with the hospitalist on call if the hospitalist that took care of you is not available. Once you are discharged, your primary care physician will handle any further medical issues. Please note that NO REFILLS for any discharge medications will be authorized once you are discharged, as it is imperative that you return to your primary care physician (or establish a relationship with a primary care physician if you do not have one) for your aftercare needs so that they can reassess your need for medications and monitor your lab values.      Discharge Instructions    Discharge instructions    Complete by:  As directed   Regular diet     Increase activity slowly    Complete by:  As directed             Medication List    TAKE these medications        acetaminophen 160 MG/5ML liquid  Commonly known as:  TYLENOL  Take 325 mg by mouth every  4 (four) hours as needed for fever.     amoxicillin 500 MG capsule  Commonly known as:  AMOXIL  Take 1 capsule (500 mg total) by mouth every 12 (twelve) hours.     aspirin 81 MG tablet  Take 81 mg by mouth daily.     CALCIUM 600+D 600-800 MG-UNIT Tabs  Generic drug:  Calcium Carb-Cholecalciferol  Take 1 tablet by mouth daily.     donepezil 10 MG tablet  Commonly known as:  ARICEPT  Take 1 tablet (10 mg total) by mouth at bedtime.     feeding supplement (ENSURE ENLIVE) Liqd  Take 237 mLs by mouth 2 (two) times daily between meals.     GLUCOSAMINE 1500 COMPLEX PO  Take 1 tablet by mouth  daily.     memantine 10 MG tablet  Commonly known as:  NAMENDA  Take 10 mg by mouth 2 (two) times daily.     multivitamin capsule  Take 1 capsule by mouth daily.     QUEtiapine 50 MG tablet  Commonly known as:  SEROQUEL  Take 50mg s daily at bedtime     tamsulosin 0.4 MG Caps capsule  Commonly known as:  FLOMAX  Take 1 capsule (0.4 mg total) by mouth at bedtime.     vitamin C 1000 MG tablet  Take 1,000 mg by mouth daily.     VITEYES AREDS FORMULA Caps  Take 1 capsule by mouth daily.       No Known Allergies    The results of significant diagnostics from this hospitalization (including imaging, microbiology, ancillary and laboratory) are listed below for reference.    Significant Diagnostic Studies: No results found.  Microbiology: Recent Results (from the past 240 hour(s))  Blood Culture (routine x 2)     Status: None (Preliminary result)   Collection Time: 11/17/15  8:40 PM  Result Value Ref Range Status   Specimen Description BLOOD RIGHT ANTECUBITAL  Final   Special Requests BOTTLES DRAWN AEROBIC AND ANAEROBIC 5ML  Final   Culture   Final    NO GROWTH 2 DAYS Performed at [facility]    Report Status PENDING  Incomplete  Blood Culture (routine x 2)     Status: None (Preliminary result)   Collection Time: 11/17/15  8:45 PM  Result Value Ref Range Status   Specimen Description BLOOD LEFT ANTECUBITAL  Final   Special Requests BOTTLES DRAWN AEROBIC AND ANAEROBIC 5ML  Final   Culture   Final    NO GROWTH 2 DAYS Performed at [facility]    Report Status PENDING  Incomplete  Urine culture     Status: None   Collection Time: 11/17/15 10:33 PM  Result Value Ref Range Status   Specimen Description URINE, CATHETERIZED  Final   Special Requests NONE  Final   Culture   Final    >=100,000 COLONIES/mL ESCHERICHIA COLI Performed at [facility]    Report Status 11/20/2015 FINAL  Final   Organism ID, Bacteria ESCHERICHIA COLI  Final       Susceptibility   Escherichia coli - MIC*    AMPICILLIN <=2 SENSITIVE Sensitive     CEFAZOLIN <=4 SENSITIVE Sensitive     CEFTRIAXONE <=1 SENSITIVE Sensitive     CIPROFLOXACIN <=0.25 SENSITIVE Sensitive     GENTAMICIN <=1 SENSITIVE Sensitive     IMIPENEM <=0.25 SENSITIVE Sensitive     NITROFURANTOIN <=16 SENSITIVE Sensitive     TRIMETH/SULFA <=20 SENSITIVE Sensitive     AMPICILLIN/SULBACTAM <=  2 SENSITIVE Sensitive     PIP/TAZO <=4 SENSITIVE Sensitive     * >=100,000 COLONIES/mL ESCHERICHIA COLI  MRSA PCR Screening     Status: None   Collection Time: 11/18/15  3:05 AM  Result Value Ref Range Status   MRSA by PCR NEGATIVE NEGATIVE Final    Comment:        The GeneXpert MRSA Assay (FDA approved for NASAL specimens only), is one component of a comprehensive MRSA colonization surveillance program. It is not intended to diagnose MRSA infection nor to guide or monitor treatment for MRSA infections.      Labs: Basic Metabolic Panel:  Recent Labs Lab 11/17/15 2102 11/17/15 2252 11/18/15 0309  NA 141 138 144  K 4.5 4.5 3.9  CL 105 107 111  CO2  --  23 24  GLUCOSE 150* 147* 122*  BUN 37* 30* 30*  CREATININE 1.60* 1.58* 1.64*  CALCIUM  --  8.9 8.6*   Liver Function Tests:  Recent Labs Lab 11/17/15 2252 11/18/15 0309  AST 27 16  ALT 9* 9*  ALKPHOS 77 75  BILITOT 0.5 0.4  PROT 6.6 6.4*  ALBUMIN 3.7 3.6   No results for input(s): LIPASE, AMYLASE in the last 168 hours. No results for input(s): AMMONIA in the last 168 hours. CBC:  Recent Labs Lab 11/17/15 2054 11/17/15 2102 11/18/15 0309  WBC 17.8*  --  13.2*  NEUTROABS 14.9*  --   --   HGB 9.8* 12.9* 10.2*  HCT 29.2* 38.0* 31.4*  MCV 87.7  --  89.2  PLT 195  --  146*   Cardiac Enzymes: No results for input(s): CKTOTAL, CKMB, CKMBINDEX, TROPONINI in the last 168 hours. BNP: BNP (last 3 results)  Recent Labs  06/21/15 1003  BNP 176.3*    ProBNP (last 3 results) No results for input(s): PROBNP in  the last 8760 hours.  CBG: No results for input(s): GLUCAP in the last 168 hours.     SignedCalvert Cantor, MD [facility] 11/21/2015, 10:57 AM

## 2015-11-21 NOTE — Progress Notes (Signed)
[facility], report given to the nurse.

## 2015-11-21 NOTE — Progress Notes (Signed)
Pharmacy Antibiotic Note  Cole Wilson is a 82 y.o. male admitted on 11/17/2015 with E.coli UTI.  MD narrowing therapy and pharmacy has been consulted for amoxicillin dosing.  Today will be day #5 antibiotics.  Plan: Amoxicillin 500 mg IV q12h.  Height: 6' (182.9 cm) Weight: 188 lb 15 oz (85.7 kg) IBW/kg (Calculated) : 77.6  Temp (24hrs), Avg:98.7 F (37.1 C), Min:98.1 F (36.7 C), Max:99.4 F (37.4 C)   Recent Labs Lab 11/17/15 2054 11/17/15 2101 11/17/15 2102 11/17/15 2252 11/18/15 0031 11/18/15 0309  WBC 17.8*  --   --   --   --  13.2*  CREATININE  --   --  1.60* 1.58*  --  1.64*  LATICACIDVEN  --  1.77  --   --  0.46*  --     Estimated Creatinine Clearance: 38.8 mL/min (by C-G formula based on Cr of 1.64).    No Known Allergies  Antimicrobials this admission: Vancomycin 4/2 >> 4/3 Zosyn 4/2 >> 4/3 Cefepime 4/3 >> 4/3 CTX 4/3 >> 4/6 Amoxicillin >>  Dose adjustments this admission: -  Microbiology results: 4/2 BCx: ngtd 4/2 UCx: E.coil (pan-sensitive)  4/3 MRSA PCR: neg  Thank you for allowing pharmacy to be a part of this patient's care.  Clance BollRunyon, Chriselda Leppert 11/21/2015 8:27 AM

## 2015-11-21 NOTE — Clinical Social Work Placement (Signed)
   CLINICAL SOCIAL WORK PLACEMENT  NOTE  Date:  11/21/2015  Patient Details  Name: Cole Wilson MRN: [id number] Date of Birth: 08-Dec-1931  Clinical Social Work is seeking post-discharge placement for this patient at the Skilled  Nursing Facility level of care (*CSW will initial, date and re-position this form in  chart as items are completed):  Yes   Patient/family provided with [facility] Clinical Social Work Department's list of facilities offering this level of care within the geographic area requested by the patient (or if unable, by the patient's family).  Yes   Patient/family informed of their freedom to choose among providers that offer the needed level of care, that participate in Medicare, Medicaid or managed care program needed by the patient, have an available bed and are willing to accept the patient.  Yes   Patient/family informed of [facility]'s ownership interest in [facility] and [facility], as well as of the fact that they are under no obligation to receive care at these facilities.  PASRR submitted to EDS on 11/20/15     PASRR number received on 11/20/15     Existing PASRR number confirmed on       FL2 transmitted to all facilities in geographic area requested by pt/family on 11/20/15     FL2 transmitted to all facilities within larger geographic area on       Patient informed that his/her managed care company has contracts with or will negotiate with certain facilities, including the following:        Yes   Patient/family informed of bed offers received.  [facility]     Physician recommends and patient chooses bed at      [facility] on 11/21/15.  Patient to be transferred to facility by ambulance Sharin Mons(PTAR)     Patient family notified on 11/21/15 of transfer.  Name of family member notified:  pt and pt wife, Rosalita ChessmanSuzanne notified at bedside     PHYSICIAN Please sign FL2      Additional Comment:    _______________________________________________ Orson EvaKIDD, SUZANNA A, LCSW 11/21/2015, 12:01 PM

## 2015-11-21 NOTE — Progress Notes (Signed)
RN attempted to call report to [facility] without success. Left a message on voice mail at phone [phone] given to RN by LSW. Ambulance in to transport Pt, d/c instructions accompanied pt, left the unit in stable condition.

## 2015-11-21 NOTE — NC FL2 (Signed)
[place] MEDICAID FL2 LEVEL OF CARE SCREENING TOOL     IDENTIFICATION  Patient Name: Cole RobertRichard J Mehaffey Birthdate: 1931/10/12 Sex: male Admission Date (Current Location): 11/17/2015  [facility] [place]  [place]   Facility and Address:  [facility],  [address], [place] [id number]      Provider Number: [id number]  Attending Physician Name and Address:  Calvert CantorSaima Rizwan, MD  Relative Name and Phone Number:       Current Level of Care: Hospital Recommended Level of Care: Skilled Nursing Facility Prior Approval Number:    Date Approved/Denied:   PASRR Number: [id number]  Discharge Plan: SNF    Current Diagnoses: Patient Active Problem List   Diagnosis Date Noted  . Pressure ulcer 11/19/2015  . Sepsis due to urinary tract infection (HCC) 11/18/2015  . Hypotension 11/18/2015  . Anemia 11/18/2015  . Acute encephalopathy 11/18/2015  . CKD (chronic kidney disease), stage III 11/18/2015  . Dementia without behavioral disturbance 06/22/2015  . Acute pancreatitis 06/21/2015  . Sepsis (HCC) 06/21/2015  . Transaminitis 06/21/2015  . Alcohol use (HCC) 06/21/2015  . Acute renal failure (HCC) 06/21/2015  . Leucocytosis 03/20/2012    Orientation RESPIRATION BLADDER Height & Weight     Self, Time, Situation, Place  Normal Incontinent Weight: 188 lb 15 oz (85.7 kg) Height:  6' (182.9 cm)  BEHAVIORAL SYMPTOMS/MOOD NEUROLOGICAL BOWEL NUTRITION STATUS  Other (Comment) (Dementia without behavioral disturbance)  (NONE) Continent Diet (Diet Heart)  AMBULATORY STATUS COMMUNICATION OF NEEDS Skin   Limited Assist Verbally PU Stage and Appropriate Care (Stage I to Sacrum) PU Stage 1 Dressing:  (Silicone Dressing (allevyn) PRN)                     Personal Care Assistance Level of Assistance  Bathing, Feeding, Dressing Bathing Assistance: Limited assistance Feeding assistance: Independent Dressing Assistance: Limited assistance     Functional  Limitations Info  Sight, Hearing, Speech Sight Info: Adequate Hearing Info: Adequate Speech Info: Adequate    SPECIAL CARE FACTORS FREQUENCY  PT (By licensed PT)     PT Frequency: 5 x a week              Contractures Contractures Info: Not present    Additional Factors Info  Code Status, Allergies, Psychotropic Code Status Info: FULL code status Allergies Info: No Known Allergies Psychotropic Info: Aricept, Namenda, Seroquel         Current Medications (11/21/2015):  This is the current hospital active medication list Current Facility-Administered Medications  Medication Dose Route Frequency Provider Last Rate Last Dose  . acetaminophen (TYLENOL) tablet 650 mg  650 mg Oral Q6H PRN Clydie Braunondell A Smith, MD   650 mg at 11/18/15 1242   Or  . acetaminophen (TYLENOL) suppository 650 mg  650 mg Rectal Q6H PRN Clydie Braunondell A Smith, MD      . albuterol (PROVENTIL) (2.5 MG/3ML) 0.083% nebulizer solution 2.5 mg  2.5 mg Nebulization Q2H PRN Rondell A Katrinka BlazingSmith, MD      . amoxicillin (AMOXIL) capsule 500 mg  500 mg Oral Q12H Maryanna ShapeAmanda M Runyon, RPH   500 mg at 11/21/15 1009  . aspirin EC tablet 81 mg  81 mg Oral Daily Rondell Burtis JunesA Smith, MD   81 mg at 11/21/15 1010  . calcium-vitamin D (OSCAL WITH D) 500-200 MG-UNIT per tablet 1 tablet  1 tablet Oral Daily Clydie Braunondell A Smith, MD   1 tablet at 11/21/15 1010  . donepezil (ARICEPT) tablet 10 mg  10 mg Oral QHS Marinda Elk, MD   10 mg at 11/20/15 2126  . feeding supplement (ENSURE ENLIVE) (ENSURE ENLIVE) liquid 237 mL  237 mL Oral BID BM Anderson Malta Ostheim, RD   237 mL at 11/21/15 1010  . haloperidol lactate (HALDOL) injection 2 mg  2 mg Intravenous Q6H PRN Marinda Elk, MD   2 mg at 11/19/15 2327  . heparin injection 5,000 Units  5,000 Units Subcutaneous 3 times per day Clydie Braun, MD   5,000 Units at 11/21/15 0607  . memantine (NAMENDA) tablet 10 mg  10 mg Oral BID Clydie Braun, MD   10 mg at 11/21/15 1010  . multivitamin with minerals  tablet 1 tablet  1 tablet Oral Daily Clydie Braun, MD   1 tablet at 11/21/15 1009  . QUEtiapine (SEROQUEL) tablet 50 mg  50 mg Oral QHS Clydie Braun, MD   50 mg at 11/20/15 2126  . sodium chloride flush (NS) 0.9 % injection 3 mL  3 mL Intravenous Q12H Rondell Burtis Junes, MD   3 mL at 11/21/15 1011  . tamsulosin (FLOMAX) capsule 0.4 mg  0.4 mg Oral Daily Marinda Elk, MD   0.4 mg at 11/21/15 1010  . vitamin C (ASCORBIC ACID) tablet 1,000 mg  1,000 mg Oral Daily Clydie Braun, MD   1,000 mg at 11/21/15 1009     Discharge Medications: Please see discharge summary for a list of discharge medications.  Relevant Imaging Results:  Relevant Lab Results:   Additional Information SSN: [id number]  Cymone Yeske A, LCSW

## 2015-11-21 NOTE — Progress Notes (Signed)
[facility].   CSW facilitated pt discharge needs including contacting facility, faxing pt discharge information via epic hub, discussing with pt and pt wife, Rosalita ChessmanSuzanne at bedside, providing RN phone number to call report, and arranging ambulance transport for [facility] [place].  Pt and pt wife coping appropriately with pt transition to rehab section of [facility].   No further social work needs identified at this time.  CSW signing off.   Loletta SpecterSuzanna Kidd, MSW, LCSW Clinical Social Work 5E coverage [phone]

## 2015-11-23 LAB — CULTURE, BLOOD (ROUTINE X 2)
CULTURE: NO GROWTH
Culture: NO GROWTH

## 2015-12-11 ENCOUNTER — Other Ambulatory Visit: Payer: Self-pay | Admitting: Neurology

## 2015-12-20 ENCOUNTER — Other Ambulatory Visit: Payer: Self-pay | Admitting: Neurology

## 2016-03-11 ENCOUNTER — Inpatient Hospital Stay (HOSPITAL_COMMUNITY)
Admission: EM | Admit: 2016-03-11 | Discharge: 2016-03-18 | DRG: 418 | Disposition: A | Discharge status: Skilled Nursing Facility | Payer: Medicare Other | Attending: Internal Medicine | Admitting: Internal Medicine

## 2016-03-11 ENCOUNTER — Emergency Department (HOSPITAL_COMMUNITY): Payer: Medicare Other

## 2016-03-11 ENCOUNTER — Observation Stay (HOSPITAL_COMMUNITY): Payer: Medicare Other

## 2016-03-11 ENCOUNTER — Encounter (HOSPITAL_COMMUNITY): Payer: Self-pay | Admitting: Emergency Medicine

## 2016-03-11 DIAGNOSIS — N183 Chronic kidney disease, stage 3 unspecified: Secondary | ICD-10-CM | POA: Diagnosis present

## 2016-03-11 DIAGNOSIS — F32A Depression, unspecified: Secondary | ICD-10-CM | POA: Diagnosis present

## 2016-03-11 DIAGNOSIS — F0390 Unspecified dementia without behavioral disturbance: Secondary | ICD-10-CM | POA: Diagnosis not present

## 2016-03-11 DIAGNOSIS — Z87440 Personal history of urinary (tract) infections: Secondary | ICD-10-CM

## 2016-03-11 DIAGNOSIS — R001 Bradycardia, unspecified: Secondary | ICD-10-CM | POA: Diagnosis not present

## 2016-03-11 DIAGNOSIS — D62 Acute posthemorrhagic anemia: Secondary | ICD-10-CM | POA: Diagnosis not present

## 2016-03-11 DIAGNOSIS — F329 Major depressive disorder, single episode, unspecified: Secondary | ICD-10-CM | POA: Diagnosis present

## 2016-03-11 DIAGNOSIS — Z981 Arthrodesis status: Secondary | ICD-10-CM

## 2016-03-11 DIAGNOSIS — I495 Sick sinus syndrome: Secondary | ICD-10-CM | POA: Diagnosis present

## 2016-03-11 DIAGNOSIS — E876 Hypokalemia: Secondary | ICD-10-CM | POA: Diagnosis present

## 2016-03-11 DIAGNOSIS — Z96652 Presence of left artificial knee joint: Secondary | ICD-10-CM | POA: Diagnosis present

## 2016-03-11 DIAGNOSIS — K859 Acute pancreatitis without necrosis or infection, unspecified: Secondary | ICD-10-CM | POA: Diagnosis present

## 2016-03-11 DIAGNOSIS — I129 Hypertensive chronic kidney disease with stage 1 through stage 4 chronic kidney disease, or unspecified chronic kidney disease: Secondary | ICD-10-CM | POA: Diagnosis present

## 2016-03-11 DIAGNOSIS — Z7982 Long term (current) use of aspirin: Secondary | ICD-10-CM

## 2016-03-11 DIAGNOSIS — K851 Biliary acute pancreatitis without necrosis or infection: Secondary | ICD-10-CM | POA: Diagnosis not present

## 2016-03-11 DIAGNOSIS — I481 Persistent atrial fibrillation: Secondary | ICD-10-CM | POA: Diagnosis present

## 2016-03-11 DIAGNOSIS — R1013 Epigastric pain: Secondary | ICD-10-CM | POA: Diagnosis not present

## 2016-03-11 DIAGNOSIS — N390 Urinary tract infection, site not specified: Secondary | ICD-10-CM | POA: Diagnosis present

## 2016-03-11 DIAGNOSIS — Z66 Do not resuscitate: Secondary | ICD-10-CM | POA: Diagnosis present

## 2016-03-11 DIAGNOSIS — I480 Paroxysmal atrial fibrillation: Secondary | ICD-10-CM

## 2016-03-11 DIAGNOSIS — K8500 Idiopathic acute pancreatitis without necrosis or infection: Secondary | ICD-10-CM

## 2016-03-11 HISTORY — DX: Chronic kidney disease, stage 3 unspecified: N18.30

## 2016-03-11 HISTORY — DX: Depression, unspecified: F32.A

## 2016-03-11 HISTORY — DX: Major depressive disorder, single episode, unspecified: F32.9

## 2016-03-11 HISTORY — DX: Chronic kidney disease, stage 3 (moderate): N18.3

## 2016-03-11 HISTORY — DX: Acute pancreatitis without necrosis or infection, unspecified: K85.90

## 2016-03-11 LAB — URINE MICROSCOPIC-ADD ON

## 2016-03-11 LAB — URINALYSIS, ROUTINE W REFLEX MICROSCOPIC
BILIRUBIN URINE: NEGATIVE
Glucose, UA: NEGATIVE mg/dL
HGB URINE DIPSTICK: NEGATIVE
KETONES UR: NEGATIVE mg/dL
NITRITE: NEGATIVE
PROTEIN: NEGATIVE mg/dL
SPECIFIC GRAVITY, URINE: 1.018 (ref 1.005–1.030)
pH: 7 (ref 5.0–8.0)

## 2016-03-11 LAB — CBC
HCT: 36.1 % — ABNORMAL LOW (ref 39.0–52.0)
HEMOGLOBIN: 12 g/dL — AB (ref 13.0–17.0)
MCH: 29.3 pg (ref 26.0–34.0)
MCHC: 33.2 g/dL (ref 30.0–36.0)
MCV: 88 fL (ref 78.0–100.0)
PLATELETS: 151 10*3/uL (ref 150–400)
RBC: 4.1 MIL/uL — ABNORMAL LOW (ref 4.22–5.81)
RDW: 13.8 % (ref 11.5–15.5)
WBC: 9.5 10*3/uL (ref 4.0–10.5)

## 2016-03-11 LAB — I-STAT CHEM 8, ED
BUN: 32 mg/dL — ABNORMAL HIGH (ref 6–20)
CALCIUM ION: 1.22 mmol/L (ref 1.12–1.23)
Chloride: 104 mmol/L (ref 101–111)
Creatinine, Ser: 1.6 mg/dL — ABNORMAL HIGH (ref 0.61–1.24)
Glucose, Bld: 147 mg/dL — ABNORMAL HIGH (ref 65–99)
HEMATOCRIT: 38 % — AB (ref 39.0–52.0)
HEMOGLOBIN: 12.9 g/dL — AB (ref 13.0–17.0)
Potassium: 4.7 mmol/L (ref 3.5–5.1)
SODIUM: 142 mmol/L (ref 135–145)
TCO2: 27 mmol/L (ref 0–100)

## 2016-03-11 LAB — LIPID PANEL
CHOL/HDL RATIO: 3.1 ratio
Cholesterol: 210 mg/dL — ABNORMAL HIGH (ref 0–200)
HDL: 68 mg/dL (ref >40)
LDL CALC: 127 mg/dL — AB (ref 0–99)
Triglycerides: 76 mg/dL (ref <150)
VLDL: 15 mg/dL (ref 0–40)

## 2016-03-11 LAB — I-STAT TROPONIN, ED
TROPONIN I, POC: 0 ng/mL (ref 0.00–0.08)
Troponin i, poc: 0 ng/mL (ref 0.00–0.08)

## 2016-03-11 LAB — COMPREHENSIVE METABOLIC PANEL
ALBUMIN: 4 g/dL (ref 3.5–5.0)
ALK PHOS: 121 U/L (ref 38–126)
ALT: 45 U/L (ref 17–63)
ANION GAP: 9 (ref 5–15)
AST: 104 U/L — ABNORMAL HIGH (ref 15–41)
BILIRUBIN TOTAL: 1 mg/dL (ref 0.3–1.2)
BUN: 27 mg/dL — ABNORMAL HIGH (ref 6–20)
CALCIUM: 9.4 mg/dL (ref 8.9–10.3)
CO2: 23 mmol/L (ref 22–32)
CREATININE: 1.69 mg/dL — AB (ref 0.61–1.24)
Chloride: 106 mmol/L (ref 101–111)
GFR calc non Af Amer: 36 mL/min — ABNORMAL LOW (ref >60)
GFR, EST AFRICAN AMERICAN: 42 mL/min — AB (ref >60)
GLUCOSE: 149 mg/dL — AB (ref 65–99)
Potassium: 4.6 mmol/L (ref 3.5–5.1)
Sodium: 138 mmol/L (ref 135–145)
TOTAL PROTEIN: 6.5 g/dL (ref 6.5–8.1)

## 2016-03-11 LAB — PROTIME-INR
INR: 1.11
Prothrombin Time: 14.3 seconds (ref 11.4–15.2)

## 2016-03-11 LAB — I-STAT CG4 LACTIC ACID, ED: LACTIC ACID, VENOUS: 1.34 mmol/L (ref 0.5–1.9)

## 2016-03-11 LAB — BRAIN NATRIURETIC PEPTIDE: B NATRIURETIC PEPTIDE 5: 73 pg/mL (ref 0.0–100.0)

## 2016-03-11 LAB — LIPASE, BLOOD: Lipase: 5724 U/L — ABNORMAL HIGH (ref 11–51)

## 2016-03-11 MED ORDER — GLUCOSAMINE 1500 COMPLEX PO CAPS: ORAL_CAPSULE | Freq: Every day | ORAL | Status: DC

## 2016-03-11 MED ORDER — SODIUM CHLORIDE 0.9 % IV SOLN
INTRAVENOUS | Status: DC
  Administered 2016-03-11 – 2016-03-14 (×5): via INTRAVENOUS
  Administered 2016-03-14: 1000 mL via INTRAVENOUS
  Administered 2016-03-15 – 2016-03-16 (×3): via INTRAVENOUS

## 2016-03-11 MED ORDER — VITAMIN C 500 MG PO TABS
1000.0000 mg | ORAL_TABLET | Freq: Every day | ORAL | Status: DC
  Administered 2016-03-12 – 2016-03-18 (×7): 1000 mg via ORAL
  Filled 2016-03-11 (×7): qty 2

## 2016-03-11 MED ORDER — CALCIUM CARBONATE-VITAMIN D 500-200 MG-UNIT PO TABS
1.0000 | ORAL_TABLET | Freq: Every day | ORAL | Status: DC
  Administered 2016-03-12 – 2016-03-13 (×2): 1 via ORAL
  Filled 2016-03-11 (×2): qty 1

## 2016-03-11 MED ORDER — MULTIVITAMINS PO CAPS: 1.0000 | ORAL_CAPSULE | Freq: Every day | ORAL | Status: DC

## 2016-03-11 MED ORDER — MEMANTINE HCL 10 MG PO TABS
10.0000 mg | ORAL_TABLET | Freq: Two times a day (BID) | ORAL | Status: DC
  Administered 2016-03-11 – 2016-03-18 (×13): 10 mg via ORAL
  Filled 2016-03-11 (×13): qty 1

## 2016-03-11 MED ORDER — OXYCODONE HCL 5 MG PO TABS
10.0000 mg | ORAL_TABLET | ORAL | Status: DC | PRN
  Administered 2016-03-11 – 2016-03-16 (×4): 10 mg via ORAL
  Filled 2016-03-11 (×4): qty 2

## 2016-03-11 MED ORDER — ASPIRIN EC 81 MG PO TBEC
81.0000 mg | DELAYED_RELEASE_TABLET | Freq: Every day | ORAL | Status: DC
  Administered 2016-03-12 – 2016-03-18 (×7): 81 mg via ORAL
  Filled 2016-03-11 (×7): qty 1

## 2016-03-11 MED ORDER — ENOXAPARIN SODIUM 40 MG/0.4ML ~~LOC~~ SOLN
40.0000 mg | SUBCUTANEOUS | Status: DC
Start: 2016-03-11 — End: 2016-03-12
  Administered 2016-03-11: 40 mg via SUBCUTANEOUS
  Filled 2016-03-11: qty 0.4

## 2016-03-11 MED ORDER — QUETIAPINE FUMARATE 25 MG PO TABS
50.0000 mg | ORAL_TABLET | Freq: Every day | ORAL | Status: DC
  Administered 2016-03-11 – 2016-03-17 (×7): 50 mg via ORAL
  Filled 2016-03-11 (×7): qty 2

## 2016-03-11 MED ORDER — SODIUM CHLORIDE 0.9 % IV BOLUS (SEPSIS)
500.0000 mL | Freq: Once | INTRAVENOUS | Status: AC
  Administered 2016-03-11: 500 mL via INTRAVENOUS

## 2016-03-11 MED ORDER — ONDANSETRON HCL 4 MG/2ML IJ SOLN
4.0000 mg | Freq: Three times a day (TID) | INTRAMUSCULAR | Status: DC | PRN
  Administered 2016-03-12: 4 mg via INTRAVENOUS
  Filled 2016-03-11: qty 2

## 2016-03-11 MED ORDER — CALCIUM CARB-CHOLECALCIFEROL 600-800 MG-UNIT PO TABS: 1.0000 | ORAL_TABLET | Freq: Every day | ORAL | Status: DC

## 2016-03-11 MED ORDER — TRIMETHOPRIM 100 MG PO TABS
100.0000 mg | ORAL_TABLET | Freq: Every day | ORAL | Status: DC
  Administered 2016-03-12 – 2016-03-18 (×8): 100 mg via ORAL
  Filled 2016-03-11 (×9): qty 1

## 2016-03-11 MED ORDER — MORPHINE SULFATE (PF) 2 MG/ML IV SOLN
2.0000 mg | INTRAVENOUS | Status: DC | PRN
  Administered 2016-03-12: 2 mg via INTRAVENOUS
  Filled 2016-03-11: qty 1

## 2016-03-11 MED ORDER — VITEYES AREDS FORMULA PO CAPS: 1.0000 | ORAL_CAPSULE | Freq: Every day | ORAL | Status: DC

## 2016-03-11 MED ORDER — IOPAMIDOL (ISOVUE-300) INJECTION 61%
INTRAVENOUS | Status: AC
  Administered 2016-03-11: 75 mL
  Filled 2016-03-11: qty 75

## 2016-03-11 MED ORDER — DONEPEZIL HCL 10 MG PO TABS
10.0000 mg | ORAL_TABLET | Freq: Every day | ORAL | Status: DC
  Administered 2016-03-11 – 2016-03-17 (×7): 10 mg via ORAL
  Filled 2016-03-11: qty 2
  Filled 2016-03-11 (×5): qty 1
  Filled 2016-03-11: qty 2

## 2016-03-11 MED ORDER — PROSIGHT PO TABS
1.0000 | ORAL_TABLET | Freq: Every day | ORAL | Status: DC
  Administered 2016-03-12 – 2016-03-18 (×7): 1 via ORAL
  Filled 2016-03-11 (×8): qty 1

## 2016-03-11 MED ORDER — TAMSULOSIN HCL 0.4 MG PO CAPS
0.4000 mg | ORAL_CAPSULE | Freq: Every day | ORAL | Status: DC
  Administered 2016-03-11 – 2016-03-17 (×7): 0.4 mg via ORAL
  Filled 2016-03-11 (×7): qty 1

## 2016-03-11 MED ORDER — ADULT MULTIVITAMIN W/MINERALS CH
1.0000 | ORAL_TABLET | Freq: Every day | ORAL | Status: DC
  Administered 2016-03-12 – 2016-03-18 (×8): 1 via ORAL
  Filled 2016-03-11 (×8): qty 1

## 2016-03-11 NOTE — Progress Notes (Signed)
PHARMACIST - PHYSICIAN ORDER COMMUNICATION  CONCERNING: P&T Medication Policy on Herbal Medications  DESCRIPTION:  This patient's order for:  Glucosamine  has been noted.  This product(s) is classified as an "herbal" or natural product. Due to a lack of definitive safety studies or FDA approval, nonstandard manufacturing practices, plus the potential risk of unknown drug-drug interactions while on inpatient medications, the Pharmacy and Therapeutics Committee does not permit the use of "herbal" or natural products of this type within [facility]   ACTION TAKEN: The pharmacy department is unable to verify this order at this time and your patient has been informed of this safety policy. Please reevaluate patient's clinical condition at discharge and address if the herbal or natural product(s) should be resumed at that time.  Louie Casa, PharmD, BCPS 03/11/2016, 9:17 PM

## 2016-03-11 NOTE — ED Provider Notes (Signed)
[facility]-EMERGENCY DEPT Provider Note   CSN: [id number] Arrival date & time: 03/11/16  1553  First Provider Contact:  None     History   Chief Complaint Chief Complaint  Patient presents with  . Abdominal Pain    HPI Cole Wilson is a 82 y.o. male.  The history is provided by the patient and the spouse.  Abdominal Pain   This is a recurrent problem. The problem occurs constantly. The problem has been gradually worsening. The pain is associated with eating. The pain is located in the epigastric region. The pain is severe. Associated symptoms include nausea. Pertinent negatives include fever, diarrhea and melena. The symptoms are aggravated by palpation. Nothing relieves the symptoms. His past medical history does not include ulcerative colitis.    Past Medical History:  Diagnosis Date  . CKD (chronic kidney disease), stage III   . Dementia   . Depression   . Pancreatitis     Patient Active Problem List   Diagnosis Date Noted  . Pancreatitis 03/11/2016  . Bradycardia 03/11/2016  . Depression 03/11/2016  . Pressure ulcer 11/19/2015  . Sepsis due to urinary tract infection (HCC) 11/18/2015  . Hypotension 11/18/2015  . Anemia 11/18/2015  . Acute encephalopathy 11/18/2015  . CKD (chronic kidney disease), stage III 11/18/2015  . Dementia without behavioral disturbance 06/22/2015  . Acute pancreatitis 06/21/2015  . Sepsis (HCC) 06/21/2015  . Transaminitis 06/21/2015  . Alcohol use (HCC) 06/21/2015  . Leucocytosis 03/20/2012    Past Surgical History:  Procedure Laterality Date  . ANKLE FUSION Right 1970/1990  . APPENDECTOMY    . JOINT REPLACEMENT    . KNEE ARTHROSCOPY Left 2002  . TONSILLECTOMY  1952  . TOTAL KNEE ARTHROPLASTY Left 2009       Home Medications    Prior to Admission medications   Medication Sig Start Date End Date Taking? Authorizing Provider  acetaminophen (TYLENOL) 160 MG/5ML liquid Take 325 mg by mouth every 4 (four) hours as needed for  fever.   Yes Historical Provider, MD  aspirin 81 MG tablet Take 81 mg by mouth daily.   Yes Historical Provider, MD  Calcium Carb-Cholecalciferol (CALCIUM 600+D) 600-800 MG-UNIT TABS Take 1 tablet by mouth daily.   Yes Historical Provider, MD  donepezil (ARICEPT) 10 MG tablet Take 1 tablet (10 mg total) by mouth at bedtime. 11/11/15  Yes York Spaniel, MD  Glucosamine-Chondroit-Vit C-Mn (GLUCOSAMINE 1500 COMPLEX PO) Take 1 tablet by mouth daily.   Yes Historical Provider, MD  memantine (NAMENDA) 10 MG tablet TAKE 1 TABLET BY MOUTH TWICE DAILY 12/20/15  Yes York Spaniel, MD  Multiple Vitamin (MULTIVITAMIN) capsule Take 1 capsule by mouth daily.     Yes Historical Provider, MD  Multiple Vitamins-Minerals (VITEYES AREDS FORMULA) CAPS Take 1 capsule by mouth daily.    Yes Historical Provider, MD  QUEtiapine (SEROQUEL) 50 MG tablet Take 50mg s daily at bedtime 09/27/15  Yes Historical Provider, MD  tamsulosin (FLOMAX) 0.4 MG CAPS capsule Take 1 capsule (0.4 mg total) by mouth at bedtime. 11/21/15  Yes Calvert Cantor, MD  TRIMETHOPRIM PO Take 100 mg by mouth daily.   Yes Historical Provider, MD  amoxicillin (AMOXIL) 500 MG capsule Take 1 capsule (500 mg total) by mouth every 12 (twelve) hours. 11/21/15   Calvert Cantor, MD  Ascorbic Acid (VITAMIN C) 1000 MG tablet Take 1,000 mg by mouth daily.    Historical Provider, MD  feeding supplement, ENSURE ENLIVE, (ENSURE ENLIVE) LIQD Take 237 mLs by mouth  2 (two) times daily between meals. 11/21/15   Calvert Cantor, MD    Family History Family History  Problem Relation Age of Onset  . Cancer Mother     breast, uterine  . Cancer - Lung Father   . Dementia Paternal Grandmother     Social History Social History  Substance Use Topics  . Smoking status: Never Smoker  . Smokeless tobacco: Never Used  . Alcohol use No     Allergies   Review of patient's allergies indicates no known allergies.   Review of Systems Review of Systems  Unable to perform ROS:  Dementia  Constitutional: Negative for fever.  Cardiovascular: Negative for chest pain and palpitations.  Gastrointestinal: Positive for abdominal pain and nausea. Negative for blood in stool, diarrhea and melena.     Physical Exam Updated Vital Signs BP 133/70 (BP Location: Right Arm)   Pulse 62   Temp 97.9 F (36.6 C) (Oral)   Resp 18   Ht  (1.854 m)   Wt 86.1 kg Comment: scale a  SpO2 97%   BMI 25.05 kg/m   Physical Exam  Constitutional: He appears well-developed and well-nourished.  HENT:  Head: Normocephalic and atraumatic.  Eyes: Conjunctivae are normal.  Neck: Neck supple.  Cardiovascular: Regular rhythm.  Bradycardia present.   No murmur heard. Pulses:      Radial pulses are 2+ on the right side, and 2+ on the left side.  Pulmonary/Chest: Effort normal and breath sounds normal. No respiratory distress.  Abdominal: Soft. There is tenderness in the epigastric area. There is no rigidity, no rebound, no guarding, no CVA tenderness, no tenderness at McBurney's point and negative Murphy's sign.  Musculoskeletal: He exhibits no edema.  Neurological: He is alert.  Skin: Skin is warm and dry.  Psychiatric: He has a normal mood and affect.  Nursing note and vitals reviewed.    ED Treatments / Results  Labs (all labs ordered are listed, but only abnormal results are displayed) Labs Reviewed  LIPASE, BLOOD - Abnormal; Notable for the following:       Result Value   Lipase 5,724 (*)    All other components within normal limits  COMPREHENSIVE METABOLIC PANEL - Abnormal; Notable for the following:    Glucose, Bld 149 (*)    BUN 27 (*)    Creatinine, Ser 1.69 (*)    AST 104 (*)    GFR calc non Af Amer 36 (*)    GFR calc Af Amer 42 (*)    All other components within normal limits  CBC - Abnormal; Notable for the following:    RBC 4.10 (*)    Hemoglobin 12.0 (*)    HCT 36.1 (*)    All other components within normal limits  URINALYSIS, ROUTINE W REFLEX  MICROSCOPIC (NOT AT [facility]) - Abnormal; Notable for the following:    Leukocytes, UA SMALL (*)    All other components within normal limits  URINE MICROSCOPIC-ADD ON - Abnormal; Notable for the following:    Squamous Epithelial / LPF 0-5 (*)    Bacteria, UA FEW (*)    All other components within normal limits  LIPID PANEL - Abnormal; Notable for the following:    Cholesterol 210 (*)    LDL Cholesterol 127 (*)    All other components within normal limits  LIPID PANEL - Abnormal; Notable for the following:    LDL Cholesterol 113 (*)    All other components within normal limits  COMPREHENSIVE METABOLIC  PANEL - Abnormal; Notable for the following:    Glucose, Bld 160 (*)    BUN 25 (*)    Creatinine, Ser 1.55 (*)    AST 120 (*)    ALT 75 (*)    GFR calc non Af Amer 40 (*)    GFR calc Af Amer 47 (*)    All other components within normal limits  CBC - Abnormal; Notable for the following:    Hemoglobin 12.3 (*)    HCT 38.5 (*)    All other components within normal limits  LIPASE, BLOOD - Abnormal; Notable for the following:    Lipase 2,805 (*)    All other components within normal limits  I-STAT CHEM 8, ED - Abnormal; Notable for the following:    BUN 32 (*)    Creatinine, Ser 1.60 (*)    Glucose, Bld 147 (*)    Hemoglobin 12.9 (*)    HCT 38.0 (*)    All other components within normal limits  MRSA PCR SCREENING  CULTURE, BLOOD (ROUTINE X 2)  CULTURE, BLOOD (ROUTINE X 2)  URINE CULTURE  BRAIN NATRIURETIC PEPTIDE  TSH  T4, FREE  PROTIME-INR  HEMOGLOBIN A1C  HEPARIN LEVEL (UNFRACTIONATED)  I-STAT CG4 LACTIC ACID, ED  Rosezena Sensor, ED  I-STAT TROPOININ, ED  I-STAT TROPOININ, ED    EKG  EKG Interpretation  Date/Time:  Wednesday March 11 2016 16:00:02 EDT Ventricular Rate:  59 PR Interval:    QRS Duration: 97 QT Interval:  444 QTC Calculation: 440 R Axis:   6 Text Interpretation:  Sinus rhythm Atrial premature complex No significant change since last tracing  Confirmed by FLOYD MD, Reuel Boom [phone]) on 03/11/2016 4:10:07 PM       Radiology US Abdomen Complete  Result Date: 03/11/2016 CLINICAL DATA:  Dull abdominal pain with pancreatitis on recent CT examination EXAM: ABDOMEN ULTRASOUND COMPLETE COMPARISON:  CT from earlier in the same day FINDINGS: Gallbladder: Well distended with mild gallbladder wall thickening to 3.8 mm. Some dependent tiny gallstones are seen. No pericholecystic fluid is noted. Common bile duct: Diameter: 5 mm. Liver: Slight increased echogenicity is noted consistent with fatty infiltration similar to that seen on recent CT examination. Minimal biliary ductal dilatation is seen likely related to the underlying pancreatitis. IVC: Not well visualized Pancreas: Not visualized due to overlying bowel gas. Spleen: Size and appearance within normal limits. Right Kidney: Length: 10.8 cm. 1.5 cm cyst is noted similar to that seen on recent CT examination. Left Kidney: Length: 11.6 cm. Echogenicity within normal limits. No mass or hydronephrosis visualized. Abdominal aorta: Not visualized due to overlying bowel gas. Other findings: None. IMPRESSION: Examination is limited due to overlying bowel gas. Mild wall thickening with small gallstones. Fatty liver with mild biliary ductal dilatation. The ductal dilatation is likely related to the underlying pancreatitis. Electronically Signed   By: Alcide Clever M.D.   On: 03/11/2016 20:44  Ct Abdomen Pelvis W Contrast  Result Date: 03/11/2016 CLINICAL DATA:  Diffuse abdominal pain started at noon. EXAM: CT ABDOMEN AND PELVIS WITH CONTRAST TECHNIQUE: Multidetector CT imaging of the abdomen and pelvis was performed using the standard protocol following bolus administration of intravenous contrast. CONTRAST:  75mL ISOVUE-300 IOPAMIDOL (ISOVUE-300) INJECTION 61% COMPARISON:  None. FINDINGS: Lower chest:  Mild bibasilar atelectasis. Hepatobiliary: No masses or other significant abnormality. Pancreas: Severe  peripancreatic inflammatory changes and fluid consistent with acute pancreatitis. Pancreas enhances normally and homogeneously without areas of pancreatic necrosis. Spleen: Within normal limits in size and  appearance. Adrenals/Urinary Tract: Normal adrenal glands. Normal kidneys. Air within the bladder which may be secondary to instrumentation, but in the absence of instrumentation this can be seen with cystitis. Stomach/Bowel: No bowel wall thickening or dilatation. No pneumatosis, pneumoperitoneum or portal venous gas. Normal appendix. Colonic diverticulosis without evidence of diverticulitis. No pelvic free fluid. Vascular/Lymphatic: Abdominal aortic atherosclerosis. Normal caliber abdominal aorta. Other: None. Musculoskeletal: No aggressive lytic or sclerotic osseous lesion. No acute osseous abnormality. Severe bilateral facet arthropathy at L4-5 and L5-S1. Degenerative disc disease at L4-5 and L5-S1. IMPRESSION: 1. Findings consistent with acute pancreatitis. No focal fluid collection to suggest a pseudocyst or abscess. 2. Air within the bladder which may be secondary to instrumentation, but in the absence of instrumentation this can be seen with cystitis. 3. Diverticulosis without evidence of diverticulitis. Electronically Signed   By: Elige Ko   On: 03/11/2016 18:38  Dg Chest Portable 1 View  Result Date: 03/11/2016 CLINICAL DATA:  Chest discomfort EXAM: PORTABLE CHEST 1 VIEW COMPARISON:  12/29/2015 FINDINGS: The heart size and mediastinal contours are within normal limits. Both lungs are clear. The visualized skeletal structures are unremarkable. IMPRESSION: No active disease. Electronically Signed   By: Alcide Clever M.D.   On: 03/11/2016 17:50   Procedures Procedures (including critical care time)  Medications Ordered in ED Medications  trimethoprim (TRIMPEX) tablet 100 mg (100 mg Oral Given 03/12/16 0843)  memantine (NAMENDA) tablet 10 mg (10 mg Oral Given 03/12/16 0844)  tamsulosin  (FLOMAX) capsule 0.4 mg (0.4 mg Oral Given 03/11/16 2235)  donepezil (ARICEPT) tablet 10 mg (10 mg Oral Given 03/11/16 2235)  QUEtiapine (SEROQUEL) tablet 50 mg (50 mg Oral Given 03/11/16 2235)  vitamin C (ASCORBIC ACID) tablet 1,000 mg (1,000 mg Oral Given 03/12/16 0844)  aspirin EC tablet 81 mg (81 mg Oral Given 03/12/16 0843)  ondansetron (ZOFRAN) injection 4 mg (not administered)  morphine 2 MG/ML injection 2 mg (2 mg Intravenous Given 03/12/16 0245)  oxyCODONE (Oxy IR/ROXICODONE) immediate release tablet 10 mg (10 mg Oral Given 03/11/16 2235)  0.9 %  sodium chloride infusion ( Intravenous New Bag/Given 03/12/16 0620)  calcium-vitamin D (OSCAL WITH D) 500-200 MG-UNIT per tablet 1 tablet (1 tablet Oral Given 03/12/16 0620)  multivitamin (PROSIGHT) tablet 1 tablet (1 tablet Oral Given 03/12/16 0845)  multivitamin with minerals tablet 1 tablet (1 tablet Oral Given 03/12/16 0844)  heparin ADULT infusion 100 units/mL (25000 units/223mL sodium chloride 0.45%) (1,200 Units/hr Intravenous New Bag/Given 03/12/16 1011)  sodium chloride 0.9 % bolus 500 mL (0 mLs Intravenous Stopped 03/11/16 1835)  iopamidol (ISOVUE-300) 61 % injection (75 mLs  Contrast Given 03/11/16 1802)  heparin bolus via infusion 4,000 Units (4,000 Units Intravenous Bolus from Bag 03/12/16 1008)     Initial Impression / Assessment and Plan / ED Course  I have reviewed the triage vital signs and the nursing notes.  Pertinent labs & imaging results that were available during my care of the patient were reviewed by me and considered in my medical decision making (see chart for details).  Clinical Course    The pt is a 82 yo male presenting to the ED for abdominal pain with n/v that started earlier in the day.   On exam pt with intermittent bradycardia but no hypotension.  Baseline mental status per the wife.  Epigastric abdominal pain on exam.  Wife reports hx of pancreatitis with etoh as possible etiology in past and pt has stopped etoh  use.  CBC with mild anemia.  CMP with elevated AST and baseline elevated Cr.  LA WNL.  Lipase in 5k's and CT abdomen consistent with acute pancreatitis. Low suspicion for surgical etiology.   Labs were viewed by myself and incorporated into medical decision making.  Discussed pertinent finding with patient or caregiver prior to admission with no further questions.  Pt care supervised by my attending Dr. Adela Lank.   Tery Sanfilippo, MD PGY-3 Emergency Medicine   Final Clinical Impressions(s) / ED Diagnoses   Final diagnoses:  Pancreatitis    New Prescriptions Current Discharge Medication List       Tery Sanfilippo, MD 03/12/16 1051    Melene Plan, DO 03/12/16 1656

## 2016-03-11 NOTE — H&P (Addendum)
History and Physical    Cole Wilson:[id number] DOB: August 14, 1932 DOA: 03/11/2016  Referring MD/NP/PA:   PCP: [name], MD   Patient coming from:  The patient is coming from home.  At baseline, pt is partially dependent for his ADL.   Chief Complaint: Abdominal pain  HPI: Cole Wilson is a 82 y.o. male with medical history significant of pancreatitis, dementia, depression, CKD-III, former alcohol user, who presents with abdominal pain.  Per his wife, patient started having abdominal pain at 12:00 PM. It is located in the periumbilical area, constant, 5 out of 10, dull, nonradiating. It is aggravated by deep breath. Patient does not have chest pain, SOB or cough. It is associated with diaphoresis. He has nausea, but no vomiting or diarrhea. Patient denies cough, fever or chills.  Of note, patient had recurrent UTI, and was started with daily trimethoprim for prophylaxis by urologist, [name] since last week. Patient denies symptoms of UTI. At arrival, pt was noted to be pale and diaphoretic with heart rate 45-50's sinus brady in ED. Of note, pt stopped drinking alcohol after he had second episode of pancreatitis last year.  ED Course: pt was found to have  elevated lipase 5724, lactate 1.34, urinalysis with small amount of leukocyte, WBC 9.5, temperature 96.1, bradycardia with heart rate 45-50, oxygen saturation 98% on room air, stable renal function, AST 104, ALT 45, total bilirubin 1.0, ALP 121. negative chest x-ray. Pt is placed on tele bed for obs.  # CT-abdomen/pelvis that showed acute pancreatitis. No focal fluid collection to suggest a pseudocyst or abscess; air within the bladder which may be secondary to instrumentation, but in the absence of instrumentation this can be seen with cystitis; diverticulosis without evidence of diverticulitis.  Review of Systems:  General: no fevers, chills, no changes in body weight, has poor appetite, has fatigue HEENT: no  blurry vision, hearing changes or sore throat Pulm: no dyspnea, coughing, wheezing CV: no chest pain, no palpitations Abd: has nausea and abdominal pain. No vomiting, diarrhea, constipation GU: no dysuria, burning on urination, increased urinary frequency, hematuria  Ext: no leg edema Neuro: no unilateral weakness, numbness, or tingling, no vision change or hearing loss Skin: no rash MSK: No muscle spasm, no deformity, no limitation of range of movement in spin Heme: No easy bruising.  Travel history: No recent long distant travel.  Allergy: No Known Allergies  Past Medical History:  Diagnosis Date  . CKD (chronic kidney disease), stage III   . Dementia   . Depression   . Pancreatitis     Past Surgical History:  Procedure Laterality Date  . APPENDECTOMY    . JOINT REPLACEMENT  2009   knee (left)  . knee arthroscopy Left 2002  . OTHER SURGICAL HISTORY     fused right ankle 1970/1990  . TONSILLECTOMY  1952    Social History:  reports that he has never smoked. He has never used smokeless tobacco. He reports that he does not drink alcohol or use drugs.  Family History:  Family History  Problem Relation Age of Onset  . Cancer Mother     breast, uterine  . Cancer - Lung Father   . Dementia Paternal Grandmother      Prior to Admission medications   Medication Sig Start Date End Date Taking? Authorizing Provider  acetaminophen (TYLENOL) 160 MG/5ML liquid Take 325 mg by mouth every 4 (four) hours as needed for fever.   Yes Historical Provider, MD  aspirin  81 MG tablet Take 81 mg by mouth daily.   Yes Historical Provider, MD  Calcium Carb-Cholecalciferol (CALCIUM 600+D) 600-800 MG-UNIT TABS Take 1 tablet by mouth daily.   Yes Historical Provider, MD  donepezil (ARICEPT) 10 MG tablet Take 1 tablet (10 mg total) by mouth at bedtime. 11/11/15  Yes York Spaniel, MD  Glucosamine-Chondroit-Vit C-Mn (GLUCOSAMINE 1500 COMPLEX PO) Take 1 tablet by mouth daily.   Yes Historical  Provider, MD  memantine (NAMENDA) 10 MG tablet TAKE 1 TABLET BY MOUTH TWICE DAILY 12/20/15  Yes York Spaniel, MD  Multiple Vitamin (MULTIVITAMIN) capsule Take 1 capsule by mouth daily.     Yes Historical Provider, MD  Multiple Vitamins-Minerals (VITEYES AREDS FORMULA) CAPS Take 1 capsule by mouth daily.    Yes Historical Provider, MD  QUEtiapine (SEROQUEL) 50 MG tablet Take s daily at bedtime 09/27/15  Yes Historical Provider, MD  tamsulosin (FLOMAX) 0.4 MG CAPS capsule Take 1 capsule (0.4 mg total) by mouth at bedtime. 11/21/15  Yes Calvert Cantor, MD  TRIMETHOPRIM PO Take 100 mg by mouth daily.   Yes Historical Provider, MD  amoxicillin (AMOXIL) 500 MG capsule Take 1 capsule (500 mg total) by mouth every 12 (twelve) hours. 11/21/15   Calvert Cantor, MD  Ascorbic Acid (VITAMIN C) 1000 MG tablet Take 1,000 mg by mouth daily.    Historical Provider, MD  feeding supplement, ENSURE ENLIVE, (ENSURE ENLIVE) LIQD Take 237 mLs by mouth 2 (two) times daily between meals. 11/21/15   Calvert Cantor, MD    Physical Exam: Vitals:   03/11/16 1730 03/11/16 1830 03/11/16 1900 03/11/16 1930  BP: 161/56 160/89 153/81 148/94  Pulse: (!) 52 74 71 (!) 50  Resp: Temp:      TempSrc:      SpO2: 98% 99% 98% 98%  Weight:      Height:       General: Not in acute distress HEENT:       Eyes: PERRL, EOMI, no scleral icterus.       ENT: No discharge from the ears and nose, no pharynx injection, no tonsillar enlargement.        Neck: No JVD, no bruit, no mass felt. Heme: No neck lymph node enlargement. Cardiac: S1/S2, RRR, Bradycardia, No murmurs, No gallops or rubs. Pulm: No rales, wheezing, rhonchi or rubs. Abd: Soft, nondistended, nontender, no rebound pain, no organomegaly, BS present. GU: No hematuria Ext: No pitting leg edema bilaterally. 2+DP/PT pulse bilaterally. Musculoskeletal: No joint deformities, No joint redness or warmth, no limitation of ROM in spin. Skin: No rashes.  Neuro: Alert, oriented  to place and time, not to time, cranial nerves II-XII grossly intact, moves all extremities normally.  Psych: Patient is not psychotic, no suicidal or hemocidal ideation.  Labs on Admission: I have personally reviewed following labs and imaging studies  CBC:  Recent Labs Lab 03/11/16 1641 03/11/16 1659  WBC 9.5  --   HGB 12.0* 12.9*  HCT 36.1* 38.0*  MCV 88.0  --   PLT 151  --    Basic Metabolic Panel:  Recent Labs Lab 03/11/16 1641 03/11/16 1659  NA 138 142  K 4.6 4.7  CL 106 104  CO2 23  --   GLUCOSE 149* 147*  BUN 27* 32*  CREATININE 1.69* 1.60*  CALCIUM 9.4  --    GFR: Estimated Creatinine Clearance: 37.1 mL/min (by C-G formula based on SCr of 1.6 mg/dL). Liver Function Tests:  Recent Labs Lab  03/11/16 1641  AST 104*  ALT 45  ALKPHOS 121  BILITOT 1.0  PROT 6.5  ALBUMIN 4.0    Recent Labs Lab 03/11/16 1641  LIPASE 5,724*   No results for input(s): AMMONIA in the last 168 hours. Coagulation Profile: No results for input(s): INR, PROTIME in the last 168 hours. Cardiac Enzymes: No results for input(s): CKTOTAL, CKMB, CKMBINDEX, TROPONINI in the last 168 hours. BNP (last 3 results) No results for input(s): PROBNP in the last 8760 hours. HbA1C: No results for input(s): HGBA1C in the last 72 hours. CBG: No results for input(s): GLUCAP in the last 168 hours. Lipid Profile: No results for input(s): CHOL, HDL, LDLCALC, TRIG, CHOLHDL, LDLDIRECT in the last 72 hours. Thyroid Function Tests: No results for input(s): TSH, T4TOTAL, FREET4, T3FREE, THYROIDAB in the last 72 hours. Anemia Panel: No results for input(s): VITAMINB12, FOLATE, FERRITIN, TIBC, IRON, RETICCTPCT in the last 72 hours. Urine analysis:    Component Value Date/Time   COLORURINE YELLOW 03/11/2016 1831   APPEARANCEUR CLEAR 03/11/2016 1831   LABSPEC 1.018 03/11/2016 1831   PHURINE 7.0 03/11/2016 1831   GLUCOSEU NEGATIVE 03/11/2016 1831   HGBUR NEGATIVE 03/11/2016 1831   BILIRUBINUR  NEGATIVE 03/11/2016 1831   KETONESUR NEGATIVE 03/11/2016 1831   PROTEINUR NEGATIVE 03/11/2016 1831   UROBILINOGEN 0.2 06/21/2015 1800   NITRITE NEGATIVE 03/11/2016 1831   LEUKOCYTESUR SMALL (A) 03/11/2016 1831   Sepsis Labs: @LABRCNTIP (procalcitonin:4,lacticidven:4) )No results found for this or any previous visit (from the past 240 hour(s)).   Radiological Exams on Admission: Ct Abdomen Pelvis W Contrast  Result Date: 03/11/2016 CLINICAL DATA:  Diffuse abdominal pain started at noon. EXAM: CT ABDOMEN AND PELVIS WITH CONTRAST TECHNIQUE: Multidetector CT imaging of the abdomen and pelvis was performed using the standard protocol following bolus administration of intravenous contrast. CONTRAST:  75mL ISOVUE-300 IOPAMIDOL (ISOVUE-300) INJECTION 61% COMPARISON:  None. FINDINGS: Lower chest:  Mild bibasilar atelectasis. Hepatobiliary: No masses or other significant abnormality. Pancreas: Severe peripancreatic inflammatory changes and fluid consistent with acute pancreatitis. Pancreas enhances normally and homogeneously without areas of pancreatic necrosis. Spleen: Within normal limits in size and appearance. Adrenals/Urinary Tract: Normal adrenal glands. Normal kidneys. Air within the bladder which may be secondary to instrumentation, but in the absence of instrumentation this can be seen with cystitis. Stomach/Bowel: No bowel wall thickening or dilatation. No pneumatosis, pneumoperitoneum or portal venous gas. Normal appendix. Colonic diverticulosis without evidence of diverticulitis. No pelvic free fluid. Vascular/Lymphatic: Abdominal aortic atherosclerosis. Normal caliber abdominal aorta. Other: None. Musculoskeletal: No aggressive lytic or sclerotic osseous lesion. No acute osseous abnormality. Severe bilateral facet arthropathy at L4-5 and L5-S1. Degenerative disc disease at L4-5 and L5-S1. IMPRESSION: 1. Findings consistent with acute pancreatitis. No focal fluid collection to suggest a pseudocyst or  abscess. 2. Air within the bladder which may be secondary to instrumentation, but in the absence of instrumentation this can be seen with cystitis. 3. Diverticulosis without evidence of diverticulitis. Electronically Signed   By: Elige Ko   On: 03/11/2016 18:38  Dg Chest Portable 1 View  Result Date: 03/11/2016 CLINICAL DATA:  Chest discomfort EXAM: PORTABLE CHEST 1 VIEW COMPARISON:  12/29/2015 FINDINGS: The heart size and mediastinal contours are within normal limits. Both lungs are clear. The visualized skeletal structures are unremarkable. IMPRESSION: No active disease. Electronically Signed   By: Alcide Clever M.D.   On: 03/11/2016 17:50    EKG: Independently reviewed. Sinus rhythm, bradycardia, QTC 440, PAC, nonspecific T-wave change.  Assessment/Plan Principal Problem:  Pancreatitis Active Problems:   Dementia without behavioral disturbance   CKD (chronic kidney disease), stage III   Bradycardia   Depression   Pancreatitis: this is a recurrent issue. Lipase 5724. Etiology is not clear. Patient stopped drinking alcohol after he had the second episode of pancreatitis last year. CT-abdomen/pelvis findings are consistent with pancreatitis, and hepatobiliary system has no masses or other significant abnormality on CT scan. Currently hemodynamically stable. No signs of infection.  -will place on tele bed for observation -NPO -IVF: 500 cc of NS and then at 125 cc/hr -prn morphine and Oxycodone for pain control, IV zofran for nausea -abdominal ultrasound to re-evaluate pancreas, gallbladder, and bile ducts  -check lipid panel to rule out triglyceridemia.  Bradycardia: HR was 45 to 50 initially. Currently at 108s. Etiology is not clear. Patient is asymptomatic. No chest pain or shortness of breath. His wife reported that pt had transient atrial fibrillation recently when he had recurrent UTI. CHADSVASC is 2. He was seen by cardiologist last week, who thought his A fib was most likely  triggered by UTI and did not do further workup. -will check trop x 3 -get 2d echo given recent transient A fib -check TSH and free T4 -check BNP -continue ASA  Dementia without behavioral disturbance: -continue Namenda and donepezil  CKD (chronic kidney disease), stage III: Stable. The slight creatinine 1.6-1.8. His creatinine is 1.6 by, BUN 27 on admission. -Follow-up renal function by BMP  Depression: No SI or homicidal ideations. -Continue Seroquel  Recurrent UTI: Urinalysis has small amount of leukocytes. Patient is asymptomatic. -Continue trimethoprim prophylaxis -Follow-up urine culture   DVT ppx: sq Lovenox Code Status: Full code Family Communication: Yes, patient's wife [phone]) at bed side Disposition Plan:  Anticipate discharge back to previous home environment Consults called:  none Admission status: Obs / tele   Date of Service 03/11/2016    Lorretta Harp [facility] Pager [phone]  If 7PM-7AM, please contact night-coverage [email or web address] Password [facility] 03/11/2016, 8:14 PM

## 2016-03-11 NOTE — ED Triage Notes (Signed)
Pt sent over from [facility] after having dull abd pain that started at 12pm today per ems. Pt was noted to be pale and diaphoretic with heart rate 45-50's sinus brady. Pt is also pale and diaphoretic on arrival to ed with heart rate 47 sinus brady.  Pt c/o of nausea and dull "generalized abd pain".

## 2016-03-11 NOTE — ED Notes (Signed)
MD at bedside. 

## 2016-03-11 NOTE — Progress Notes (Signed)
Patient arrival to 3E02 via stretcher from ED.  Alert and oriented with clear speech and appropriate responses.  MOE x4 with adequate ROM.  Patient demonstrated ability to transfer and ambulate with minimal assistance.  Patient denies any pain, distress, or discomfort at this time.  POC and treatment goals discussed with patient verbalizing understanding.  Admission assessment and monitoring initiated and ongoing.  No further needs or concerns reported at this time.

## 2016-03-11 NOTE — ED Notes (Signed)
Pt in [place] then upstairs to [facility]

## 2016-03-11 NOTE — ED Notes (Signed)
Admitting at bedside 

## 2016-03-12 ENCOUNTER — Observation Stay (HOSPITAL_BASED_OUTPATIENT_CLINIC_OR_DEPARTMENT_OTHER): Payer: Medicare Other

## 2016-03-12 ENCOUNTER — Encounter (HOSPITAL_COMMUNITY): Payer: Self-pay | Admitting: Emergency Medicine

## 2016-03-12 DIAGNOSIS — F329 Major depressive disorder, single episode, unspecified: Secondary | ICD-10-CM | POA: Diagnosis present

## 2016-03-12 DIAGNOSIS — I480 Paroxysmal atrial fibrillation: Secondary | ICD-10-CM | POA: Diagnosis present

## 2016-03-12 DIAGNOSIS — I495 Sick sinus syndrome: Secondary | ICD-10-CM | POA: Diagnosis present

## 2016-03-12 DIAGNOSIS — R001 Bradycardia, unspecified: Secondary | ICD-10-CM | POA: Diagnosis not present

## 2016-03-12 DIAGNOSIS — I129 Hypertensive chronic kidney disease with stage 1 through stage 4 chronic kidney disease, or unspecified chronic kidney disease: Secondary | ICD-10-CM | POA: Diagnosis present

## 2016-03-12 DIAGNOSIS — E876 Hypokalemia: Secondary | ICD-10-CM | POA: Diagnosis present

## 2016-03-12 DIAGNOSIS — R1013 Epigastric pain: Secondary | ICD-10-CM | POA: Diagnosis present

## 2016-03-12 DIAGNOSIS — N390 Urinary tract infection, site not specified: Secondary | ICD-10-CM | POA: Diagnosis present

## 2016-03-12 DIAGNOSIS — N183 Chronic kidney disease, stage 3 (moderate): Secondary | ICD-10-CM

## 2016-03-12 DIAGNOSIS — D62 Acute posthemorrhagic anemia: Secondary | ICD-10-CM | POA: Diagnosis not present

## 2016-03-12 DIAGNOSIS — Z96652 Presence of left artificial knee joint: Secondary | ICD-10-CM | POA: Diagnosis present

## 2016-03-12 DIAGNOSIS — K8500 Idiopathic acute pancreatitis without necrosis or infection: Secondary | ICD-10-CM | POA: Diagnosis not present

## 2016-03-12 DIAGNOSIS — K859 Acute pancreatitis without necrosis or infection, unspecified: Secondary | ICD-10-CM

## 2016-03-12 DIAGNOSIS — Z7982 Long term (current) use of aspirin: Secondary | ICD-10-CM | POA: Diagnosis not present

## 2016-03-12 DIAGNOSIS — Z87440 Personal history of urinary (tract) infections: Secondary | ICD-10-CM | POA: Diagnosis not present

## 2016-03-12 DIAGNOSIS — Z66 Do not resuscitate: Secondary | ICD-10-CM | POA: Diagnosis present

## 2016-03-12 DIAGNOSIS — Z981 Arthrodesis status: Secondary | ICD-10-CM | POA: Diagnosis not present

## 2016-03-12 DIAGNOSIS — K851 Biliary acute pancreatitis without necrosis or infection: Secondary | ICD-10-CM | POA: Diagnosis present

## 2016-03-12 DIAGNOSIS — F0390 Unspecified dementia without behavioral disturbance: Secondary | ICD-10-CM | POA: Diagnosis not present

## 2016-03-12 DIAGNOSIS — I481 Persistent atrial fibrillation: Secondary | ICD-10-CM | POA: Diagnosis present

## 2016-03-12 LAB — CBC
HCT: 38.5 % — ABNORMAL LOW (ref 39.0–52.0)
Hemoglobin: 12.3 g/dL — ABNORMAL LOW (ref 13.0–17.0)
MCH: 28.1 pg (ref 26.0–34.0)
MCHC: 31.9 g/dL (ref 30.0–36.0)
MCV: 88.1 fL (ref 78.0–100.0)
Platelets: 184 10*3/uL (ref 150–400)
RBC: 4.37 MIL/uL (ref 4.22–5.81)
RDW: 13.7 % (ref 11.5–15.5)
WBC: 9.3 10*3/uL (ref 4.0–10.5)

## 2016-03-12 LAB — LIPID PANEL
CHOLESTEROL: 190 mg/dL (ref 0–200)
HDL: 63 mg/dL (ref >40)
LDL Cholesterol: 113 mg/dL — ABNORMAL HIGH (ref 0–99)
TRIGLYCERIDES: 70 mg/dL (ref <150)
Total CHOL/HDL Ratio: 3 RATIO
VLDL: 14 mg/dL (ref 0–40)

## 2016-03-12 LAB — COMPREHENSIVE METABOLIC PANEL
ALBUMIN: 3.7 g/dL (ref 3.5–5.0)
ALK PHOS: 125 U/L (ref 38–126)
ALT: 75 U/L — AB (ref 17–63)
AST: 120 U/L — AB (ref 15–41)
Anion gap: 9 (ref 5–15)
BILIRUBIN TOTAL: 0.8 mg/dL (ref 0.3–1.2)
BUN: 25 mg/dL — AB (ref 6–20)
CALCIUM: 9 mg/dL (ref 8.9–10.3)
CO2: 25 mmol/L (ref 22–32)
Chloride: 104 mmol/L (ref 101–111)
Creatinine, Ser: 1.55 mg/dL — ABNORMAL HIGH (ref 0.61–1.24)
GFR calc Af Amer: 47 mL/min — ABNORMAL LOW (ref >60)
GFR calc non Af Amer: 40 mL/min — ABNORMAL LOW (ref >60)
GLUCOSE: 160 mg/dL — AB (ref 65–99)
Potassium: 4.4 mmol/L (ref 3.5–5.1)
SODIUM: 138 mmol/L (ref 135–145)
TOTAL PROTEIN: 6.7 g/dL (ref 6.5–8.1)

## 2016-03-12 LAB — ECHOCARDIOGRAM COMPLETE
Height: 73 in
Weight: 3038.4 oz

## 2016-03-12 LAB — MRSA PCR SCREENING: MRSA BY PCR: NEGATIVE

## 2016-03-12 LAB — LIPASE, BLOOD: LIPASE: 2805 U/L — AB (ref 11–51)

## 2016-03-12 LAB — T4, FREE: Free T4: 0.91 ng/dL (ref 0.61–1.12)

## 2016-03-12 LAB — HEPARIN LEVEL (UNFRACTIONATED): Heparin Unfractionated: 0.55 IU/mL (ref 0.30–0.70)

## 2016-03-12 LAB — TSH: TSH: 2.556 u[IU]/mL (ref 0.350–4.500)

## 2016-03-12 MED ORDER — HEPARIN BOLUS VIA INFUSION
4000.0000 [IU] | Freq: Once | INTRAVENOUS | Status: AC
  Administered 2016-03-12: 4000 [IU] via INTRAVENOUS
  Filled 2016-03-12: qty 4000

## 2016-03-12 MED ORDER — HEPARIN (PORCINE) IN NACL 100-0.45 UNIT/ML-% IJ SOLN
1350.0000 [IU]/h | INTRAMUSCULAR | Status: AC
  Administered 2016-03-12 – 2016-03-14 (×4): 1200 [IU]/h via INTRAVENOUS
  Administered 2016-03-15: 1350 [IU]/h via INTRAVENOUS
  Filled 2016-03-12 (×5): qty 250

## 2016-03-12 NOTE — Progress Notes (Addendum)
PROGRESS NOTE    Cole Wilson  [name]:[id number] DOB: 1931-12-01 DOA: 03/11/2016 PCP: [name], MD   Brief Narrative:  HPI on 03/11/2016 by [name] Cole Wilson is a 82 y.o. male with medical history significant of pancreatitis, dementia, depression, CKD-III, former alcohol user, who presents with abdominal pain.  Per his wife, patient started having abdominal pain at 12:00 PM. It is located in the periumbilical area, constant, 5 out of 10, dull, nonradiating. It is aggravated by deep breath. Patient does not have chest pain, SOB or cough. It is associated with diaphoresis. He has nausea, but no vomiting or diarrhea. Patient denies cough, fever or chills.  Of note, patient had recurrent UTI, and was started with daily trimethoprim for prophylaxis by urologist, [name] since last week. Patient denies symptoms of UTI. At arrival, pt was noted to be pale and diaphoretic with heart rate 45-50's sinus brady in ED. Of note, pt stopped drinking alcohol after he had second episode of pancreatitis last year.  Assessment & Plan   Acute on chronic pancreatitis -Lipase on admission 5724, currently lipase 2805 -[place] Abd: Mild wall thickening with small gallstones, fatty liver with mild biliary ductal dilatation, likely related to underlying pancreatitis.  -CT abd/pelvis findings are consistent with pancreatitis, and hepatobiliary system has no masses or other significant abnormality on CT scan -Continue pain control, IVF, antiemetics  -Triglycerides 70 -Gastroenterology consulted and appreciated   Atrial fibrillation -CHADSVASC 2 (age) -Patient not on anticoagulation -Recently saw his cardiologist at [facility], [name]: "Persistent atrial fibrillation (RAF-HCC) Was discovered during recent ER visit with sepsis and hypotension heart rate has been well controlled on no treatment" -TSH and free T4 within normal limits -Echocardiogram EF55-60%, grade 1 diastolic  dysfunction -Started patient on heparin -Recently s  Bradycardia -TSH/FT4 within normal limits -Resolved -Continue tele monitoring  Dementia -Continue Namenda, donepezil  Chronic kidney disease, stage III -Stable, baseline creatinine 1.6-1.8 -Creatinine currently 1.5 -Continue to monitor BMP  Depression  -Continue Seroquel  Recurrent urinary tract infection -Patient currently on TMP prophylaxis -UA showed small amount leukocytes -Urine culture pending   DVT Prophylaxis  Heparin  Code Status: Full  Family Communication: none at bedside  Disposition Plan: Admitted. Continue to treat pancreatitis.  Consultants Gastroenterology  Procedures  Echocardiogram  Antibiotics   Anti-infectives    Start     Dose/Rate Route Frequency Ordered Stop   03/11/16 2200  trimethoprim (TRIMPEX) tablet 100 mg     100 mg Oral Daily 03/11/16 1953        Subjective:   Cole Wilson seen and examined today.  Patient has dementia. No complaints today. Denies nausea, vomiting, chest pain, shortness of breath. Feels abdominal pain only when his abdomen is pushed on.  Objective:   Vitals:   03/11/16 2116 03/12/16 0021 03/12/16 0546 03/12/16 1217  BP: (!) 164/83 (!) 154/86 133/70 (!) 131/56  Pulse: 85 71 62 68  Resp: 16 17 18 18   Temp: 97.7 F (36.5 C) 98.1 F (36.7 C) 97.9 F (36.6 C) 97.7 F (36.5 C)  TempSrc: Oral Oral Oral Oral  SpO2: 100% 98% 97% 93%  Weight: 82.8 kg (182 lb 9.6 oz)  86.1 kg (189 lb 14.4 oz)   Height: 6\' 1"  (1.854 m)       Intake/Output Summary (Last 24 hours) at 03/12/16 1240 Last data filed at 03/12/16 0820  Gross per 24 hour  Intake           895.83  ml  Output              750 ml  Net           145.83 ml   Filed Weights   03/11/16 1603 03/11/16 2116 03/12/16 0546  Weight: 85.3 kg (188 lb) 82.8 kg (182 lb 9.6 oz) 86.1 kg (189 lb 14.4 oz)    Exam  General: Well developed, well nourished, NAD, appears stated age  HEENT: NCAT,  mucous  membranes moist.   Cardiovascular: S1 S2 auscultated, irregular, 2/6SEM  Respiratory: Clear to auscultation bilaterally with equal chest rise  Abdomen: Soft, Epigastric TTP, nondistended, + bowel sounds  Extremities: warm dry without cyanosis clubbing or edema  Neuro: AAOx2, nonfocal  Skin: Without rashes exudates or nodules  Psych: Normal affect and demeanor, has dementia   Data Reviewed: I have personally reviewed following labs and imaging studies  CBC:  Recent Labs Lab 03/11/16 1641 03/11/16 1659 03/12/16 0317  WBC 9.5  --  9.3  HGB 12.0* 12.9* 12.3*  HCT 36.1* 38.0* 38.5*  MCV 88.0  --  88.1  PLT 151  --  184   Basic Metabolic Panel:  Recent Labs Lab 03/11/16 1641 03/11/16 1659 03/12/16 0317  NA 138 142 138  K 4.6 4.7 4.4  CL 106 104 104  CO2 23  --  25  GLUCOSE 149* 147* 160*  BUN 27* 32* 25*  CREATININE 1.69* 1.60* 1.55*  CALCIUM 9.4  --  9.0   GFR: Estimated Creatinine Clearance: 42.2 mL/min (by C-G formula based on SCr of 1.55 mg/dL). Liver Function Tests:  Recent Labs Lab 03/11/16 1641 03/12/16 0317  AST 104* 120*  ALT 45 75*  ALKPHOS 121 125  BILITOT 1.0 0.8  PROT 6.5 6.7  ALBUMIN 4.0 3.7    Recent Labs Lab 03/11/16 1641 03/12/16 0745  LIPASE 5,724* 2,805*   No results for input(s): AMMONIA in the last 168 hours. Coagulation Profile:  Recent Labs Lab 03/11/16 2140  INR 1.11   Cardiac Enzymes: No results for input(s): CKTOTAL, CKMB, CKMBINDEX, TROPONINI in the last 168 hours. BNP (last 3 results) No results for input(s): PROBNP in the last 8760 hours. HbA1C: No results for input(s): HGBA1C in the last 72 hours. CBG: No results for input(s): GLUCAP in the last 168 hours. Lipid Profile:  Recent Labs  03/11/16 2140 03/12/16 0316  CHOL 210* 190  HDL 68 63  LDLCALC 127* 113*  TRIG 76 70  CHOLHDL 3.1 3.0   Thyroid Function Tests:  Recent Labs  03/12/16 0316 03/12/16 0317  TSH 2.556  --   FREET4  --  0.91    Anemia Panel: No results for input(s): VITAMINB12, FOLATE, FERRITIN, TIBC, IRON, RETICCTPCT in the last 72 hours. Urine analysis:    Component Value Date/Time   COLORURINE YELLOW 03/11/2016 1831   APPEARANCEUR CLEAR 03/11/2016 1831   LABSPEC 1.018 03/11/2016 1831   PHURINE 7.0 03/11/2016 1831   GLUCOSEU NEGATIVE 03/11/2016 1831   HGBUR NEGATIVE 03/11/2016 1831   BILIRUBINUR NEGATIVE 03/11/2016 1831   KETONESUR NEGATIVE 03/11/2016 1831   PROTEINUR NEGATIVE 03/11/2016 1831   UROBILINOGEN 0.2 06/21/2015 1800   NITRITE NEGATIVE 03/11/2016 1831   LEUKOCYTESUR SMALL (A) 03/11/2016 1831   Sepsis Labs: (procalcitonin:4,lacticidven:4)  ) Recent Results (from the past 240 hour(s))  MRSA PCR Screening     Status: None   Collection Time: 03/12/16  4:33 AM  Result Value Ref Range Status   MRSA by PCR NEGATIVE NEGATIVE Final  Comment:        The GeneXpert MRSA Assay (FDA approved for NASAL specimens only), is one component of a comprehensive MRSA colonization surveillance program. It is not intended to diagnose MRSA infection nor to guide or monitor treatment for MRSA infections.       Radiology Studies: US Abdomen Complete  Result Date: 03/11/2016 CLINICAL DATA:  Dull abdominal pain with pancreatitis on recent CT examination EXAM: ABDOMEN ULTRASOUND COMPLETE COMPARISON:  CT from earlier in the same day FINDINGS: Gallbladder: Well distended with mild gallbladder wall thickening to 3.8 mm. Some dependent tiny gallstones are seen. No pericholecystic fluid is noted. Common bile duct: Diameter: 5 mm. Liver: Slight increased echogenicity is noted consistent with fatty infiltration similar to that seen on recent CT examination. Minimal biliary ductal dilatation is seen likely related to the underlying pancreatitis. IVC: Not well visualized Pancreas: Not visualized due to overlying bowel gas. Spleen: Size and appearance within normal limits. Right Kidney: Length: 10.8 cm. 1.5  cm cyst is noted similar to that seen on recent CT examination. Left Kidney: Length: 11.6 cm. Echogenicity within normal limits. No mass or hydronephrosis visualized. Abdominal aorta: Not visualized due to overlying bowel gas. Other findings: None. IMPRESSION: Examination is limited due to overlying bowel gas. Mild wall thickening with small gallstones. Fatty liver with mild biliary ductal dilatation. The ductal dilatation is likely related to the underlying pancreatitis. Electronically Signed   By: Alcide Clever M.D.   On: 03/11/2016 20:44  Ct Abdomen Pelvis W Contrast  Result Date: 03/11/2016 CLINICAL DATA:  Diffuse abdominal pain started at noon. EXAM: CT ABDOMEN AND PELVIS WITH CONTRAST TECHNIQUE: Multidetector CT imaging of the abdomen and pelvis was performed using the standard protocol following bolus administration of intravenous contrast. CONTRAST:  75mL ISOVUE-300 IOPAMIDOL (ISOVUE-300) INJECTION 61% COMPARISON:  None. FINDINGS: Lower chest:  Mild bibasilar atelectasis. Hepatobiliary: No masses or other significant abnormality. Pancreas: Severe peripancreatic inflammatory changes and fluid consistent with acute pancreatitis. Pancreas enhances normally and homogeneously without areas of pancreatic necrosis. Spleen: Within normal limits in size and appearance. Adrenals/Urinary Tract: Normal adrenal glands. Normal kidneys. Air within the bladder which may be secondary to instrumentation, but in the absence of instrumentation this can be seen with cystitis. Stomach/Bowel: No bowel wall thickening or dilatation. No pneumatosis, pneumoperitoneum or portal venous gas. Normal appendix. Colonic diverticulosis without evidence of diverticulitis. No pelvic free fluid. Vascular/Lymphatic: Abdominal aortic atherosclerosis. Normal caliber abdominal aorta. Other: None. Musculoskeletal: No aggressive lytic or sclerotic osseous lesion. No acute osseous abnormality. Severe bilateral facet arthropathy at L4-5 and L5-S1.  Degenerative disc disease at L4-5 and L5-S1. IMPRESSION: 1. Findings consistent with acute pancreatitis. No focal fluid collection to suggest a pseudocyst or abscess. 2. Air within the bladder which may be secondary to instrumentation, but in the absence of instrumentation this can be seen with cystitis. 3. Diverticulosis without evidence of diverticulitis. Electronically Signed   By: Elige Ko   On: 03/11/2016 18:38  Dg Chest Portable 1 View  Result Date: 03/11/2016 CLINICAL DATA:  Chest discomfort EXAM: PORTABLE CHEST 1 VIEW COMPARISON:  12/29/2015 FINDINGS: The heart size and mediastinal contours are within normal limits. Both lungs are clear. The visualized skeletal structures are unremarkable. IMPRESSION: No active disease. Electronically Signed   By: Alcide Clever M.D.   On: 03/11/2016 17:50    Scheduled Meds: . aspirin EC  81 mg Oral Daily  . calcium-vitamin D  1 tablet Oral Q breakfast  . donepezil  10 mg Oral QHS  .  memantine  10 mg Oral BID  . multivitamin  1 tablet Oral Daily  . multivitamin with minerals  1 tablet Oral Daily  . QUEtiapine  50 mg Oral QHS  . tamsulosin  0.4 mg Oral QHS  . trimethoprim  100 mg Oral Daily  . vitamin C  1,000 mg Oral Daily   Continuous Infusions: . sodium chloride 125 mL/hr at 03/12/16 0620  . heparin 1,200 Units/hr (03/12/16 1011)     LOS: 0 days   Time Spent in minutes   30 minutes  Makela Niehoff D.O. on 03/12/2016 at 12:40 PM  Between 7am to 7pm - Pager - [phone]  After 7pm go to [email or web address] - password [email or web address]  And look for the night coverage person covering for me after hours  [facility] Office  [phone]

## 2016-03-12 NOTE — Consult Note (Signed)
Referring Provider: Maceo Pro, DO   Primary Care Physician:  [name], MD   Reason for Consultation:  Pancreatitis   HPI: Cole Wilson is a 82 y.o. male admitted to hospital with abdominal pain. Patient with base line dementia. History obtained from wife and chart review. Pain started yesterday , described as epigastric and band like pain. No radiation to back. Denied nausea or vomiting. Denied fever, chills. Was seen in walk in clinic and was asked to come to ER.CT scan showed Severe peripancreatic inflammatory changes and fluid consistent with acute pancreatitis. Lipase was also elevated 2805. GI is consulted for further eval. , As per wife, patient had two episodes of pancreatitis.  last year. Was only drinking few drinks per week which he has stopped now. Patient with recurrent UTI and has been on multiple antibiotics. Was started on Trimethoprim by urology on 03/05/16  Feeling better now. Denied N/V. Denied diarrhea or constipation. Denied black stool or blood in the stool. Denied dysphagia, odynophagia.;   Last colonoscopy : 04/2011 normal per wife  Past Medical History:  Diagnosis Date  . CKD (chronic kidney disease), stage III   . Dementia   . Depression   . Pancreatitis     Past Surgical History:  Procedure Laterality Date  . ANKLE FUSION Right 1970/1990  . APPENDECTOMY    . JOINT REPLACEMENT    . KNEE ARTHROSCOPY Left 2002  . TONSILLECTOMY  1952  . TOTAL KNEE ARTHROPLASTY Left 2009    Prior to Admission medications   Medication Sig Start Date End Date Taking? Authorizing Provider  acetaminophen (TYLENOL) 160 MG/5ML liquid Take 325 mg by mouth every 4 (four) hours as needed for fever.   Yes Historical Provider, MD  amoxicillin (AMOXIL) 500 MG capsule Take 1 capsule (500 mg total) by mouth every 12 (twelve) hours. 11/21/15  Yes Calvert Cantor, MD  Ascorbic Acid (VITAMIN C) 1000 MG tablet Take 1,000 mg by mouth daily.   Yes Historical Provider, MD  aspirin  81 MG tablet Take 81 mg by mouth daily.   Yes Historical Provider, MD  Calcium Carb-Cholecalciferol (CALCIUM 600+D) 600-800 MG-UNIT TABS Take 1 tablet by mouth daily.   Yes Historical Provider, MD  donepezil (ARICEPT) 10 MG tablet Take 1 tablet (10 mg total) by mouth at bedtime. 11/11/15  Yes York Spaniel, MD  Glucosamine-Chondroit-Vit C-Mn (GLUCOSAMINE 1500 COMPLEX PO) Take 1 tablet by mouth daily.   Yes Historical Provider, MD  memantine (NAMENDA) 10 MG tablet TAKE 1 TABLET BY MOUTH TWICE DAILY 12/20/15  Yes York Spaniel, MD  Multiple Vitamin (MULTIVITAMIN) capsule Take 1 capsule by mouth daily.     Yes Historical Provider, MD  Multiple Vitamins-Minerals (VITEYES AREDS FORMULA) CAPS Take 1 capsule by mouth daily.    Yes Historical Provider, MD  QUEtiapine (SEROQUEL) 50 MG tablet Take 75 mg by mouth at bedtime.   Yes Historical Provider, MD  tamsulosin (FLOMAX) 0.4 MG CAPS capsule Take 1 capsule (0.4 mg total) by mouth at bedtime. 11/21/15  Yes Calvert Cantor, MD  TRIMETHOPRIM PO Take 100 mg by mouth at bedtime.    Yes Historical Provider, MD  feeding supplement, ENSURE ENLIVE, (ENSURE ENLIVE) LIQD Take 237 mLs by mouth 2 (two) times daily between meals. 11/21/15   Calvert Cantor, MD    Scheduled Meds: . aspirin EC  81 mg Oral Daily  . calcium-vitamin D  1 tablet Oral Q breakfast  . donepezil  10 mg Oral QHS  . memantine  10 mg Oral BID  . multivitamin  1 tablet Oral Daily  . multivitamin with minerals  1 tablet Oral Daily  . QUEtiapine  50 mg Oral QHS  . tamsulosin  0.4 mg Oral QHS  . trimethoprim  100 mg Oral Daily  . vitamin C  1,000 mg Oral Daily   Continuous Infusions: . sodium chloride 125 mL/hr at 03/12/16 0620  . heparin 1,200 Units/hr (03/12/16 1011)   PRN Meds:.morphine injection, ondansetron, oxyCODONE  Allergies as of 03/11/2016  . (No Known Allergies)    Family History  Problem Relation Age of Onset  . Cancer Mother     breast, uterine  . Cancer - Lung Father   .  Dementia Paternal Grandmother     Social History   Social History  . Marital status: Married    Spouse name: N/A  . Number of children: 3  . Years of education: MA   Occupational History  . retired    Social History Main Topics  . Smoking status: Never Smoker  . Smokeless tobacco: Never Used  . Alcohol use No  . Drug use: No  . Sexual activity: Not on file   Other Topics Concern  . Not on file   Social History Narrative   Patient lives at home with his wife [name]) They both live at [place].   Retired.   Education Masters.   Right handed.   Caffeine one cup of tea daily.       Quit alcohol after pancreatitis x 2 last yr.  2016    Review of Systems: All negative except as stated above in HPI.  Physical Exam: Vital signs: Vitals:   03/12/16 0546 03/12/16 1217  BP: 133/70 (!) 131/56  Pulse: 62 68  Resp: 18 18  Temp: 97.9 F (36.6 C) 97.7 F (36.5 C)   Last BM Date: 03/10/16 General:   Alert,  Well-developed, well-nourished, pleasant and cooperative in NAD Lungs:  Clear throughout to auscultation.   No wheezes, crackles, or rhonchi. No acute distress. Heart:  IRRR no murmurs,  Abdomen: soft, NT, ND, BS +  LE : no edema    GI:  Lab Results:  Recent Labs  03/11/16 1641 03/11/16 1659 03/12/16 0317  WBC 9.5  --  9.3  HGB 12.0* 12.9* 12.3*  HCT 36.1* 38.0* 38.5*  PLT 151  --  184   BMET  Recent Labs  03/11/16 1641 03/11/16 1659 03/12/16 0317  NA 138 142 138  K 4.6 4.7 4.4  CL 106 104 104  CO2 23  --  25  GLUCOSE 149* 147* 160*  BUN 27* 32* 25*  CREATININE 1.69* 1.60* 1.55*  CALCIUM 9.4  --  9.0   LFT  Recent Labs  03/12/16 0317  PROT 6.7  ALBUMIN 3.7  AST 120*  ALT 75*  ALKPHOS 125  BILITOT 0.8   PT/INR  Recent Labs  03/11/16 2140  LABPROT 14.3  INR 1.11     Studies/Results: US Abdomen Complete  Result Date: 03/11/2016 CLINICAL DATA:  Dull abdominal pain with pancreatitis on recent CT examination EXAM: ABDOMEN  ULTRASOUND COMPLETE COMPARISON:  CT from earlier in the same day FINDINGS: Gallbladder: Well distended with mild gallbladder wall thickening to 3.8 mm. Some dependent tiny gallstones are seen. No pericholecystic fluid is noted. Common bile duct: Diameter: 5 mm. Liver: Slight increased echogenicity is noted consistent with fatty infiltration similar to that seen on recent CT examination. Minimal biliary ductal dilatation is seen likely related to  the underlying pancreatitis. IVC: Not well visualized Pancreas: Not visualized due to overlying bowel gas. Spleen: Size and appearance within normal limits. Right Kidney: Length: 10.8 cm. 1.5 cm cyst is noted similar to that seen on recent CT examination. Left Kidney: Length: 11.6 cm. Echogenicity within normal limits. No mass or hydronephrosis visualized. Abdominal aorta: Not visualized due to overlying bowel gas. Other findings: None. IMPRESSION: Examination is limited due to overlying bowel gas. Mild wall thickening with small gallstones. Fatty liver with mild biliary ductal dilatation. The ductal dilatation is likely related to the underlying pancreatitis. Electronically Signed   By: Alcide Clever M.D.   On: 03/11/2016 20:44  Ct Abdomen Pelvis W Contrast  Result Date: 03/11/2016 CLINICAL DATA:  Diffuse abdominal pain started at noon. EXAM: CT ABDOMEN AND PELVIS WITH CONTRAST TECHNIQUE: Multidetector CT imaging of the abdomen and pelvis was performed using the standard protocol following bolus administration of intravenous contrast. CONTRAST:  75mL ISOVUE-300 IOPAMIDOL (ISOVUE-300) INJECTION 61% COMPARISON:  None. FINDINGS: Lower chest:  Mild bibasilar atelectasis. Hepatobiliary: No masses or other significant abnormality. Pancreas: Severe peripancreatic inflammatory changes and fluid consistent with acute pancreatitis. Pancreas enhances normally and homogeneously without areas of pancreatic necrosis. Spleen: Within normal limits in size and appearance.  Adrenals/Urinary Tract: Normal adrenal glands. Normal kidneys. Air within the bladder which may be secondary to instrumentation, but in the absence of instrumentation this can be seen with cystitis. Stomach/Bowel: No bowel wall thickening or dilatation. No pneumatosis, pneumoperitoneum or portal venous gas. Normal appendix. Colonic diverticulosis without evidence of diverticulitis. No pelvic free fluid. Vascular/Lymphatic: Abdominal aortic atherosclerosis. Normal caliber abdominal aorta. Other: None. Musculoskeletal: No aggressive lytic or sclerotic osseous lesion. No acute osseous abnormality. Severe bilateral facet arthropathy at L4-5 and L5-S1. Degenerative disc disease at L4-5 and L5-S1. IMPRESSION: 1. Findings consistent with acute pancreatitis. No focal fluid collection to suggest a pseudocyst or abscess. 2. Air within the bladder which may be secondary to instrumentation, but in the absence of instrumentation this can be seen with cystitis. 3. Diverticulosis without evidence of diverticulitis. Electronically Signed   By: Elige Ko   On: 03/11/2016 18:38  Dg Chest Portable 1 View  Result Date: 03/11/2016 CLINICAL DATA:  Chest discomfort EXAM: PORTABLE CHEST 1 VIEW COMPARISON:  12/29/2015 FINDINGS: The heart size and mediastinal contours are within normal limits. Both lungs are clear. The visualized skeletal structures are unremarkable. IMPRESSION: No active disease. Electronically Signed   By: Alcide Clever M.D.   On: 03/11/2016 17:50   Impression/Plan: Acute Pancreatitis.  D/D Biliary vs drug( Abx) induced.  - new onset Arrhythmia  - Dementia   Recommendations : Patient with no alcohol use. Normal lipid panel. US showed Mild Gallbladder wall thickening and tiny stones. Normal CBD. Normal T bili but mild elevated Transaminase. - Had MRI/MRCP in 07/2015 which was unremarkable.  - Recommend IV hydration  - Recommend surgical consultation for possible Cholecystectomy given H/O recurrent  pancreatitis  - Monitor LFTs  - GI will follow    LOS: 0 days   Erdem Naas  03/12/2016, 12:20 PM  Pager [phone] If no answer or after 5 PM call [phone]

## 2016-03-12 NOTE — Progress Notes (Signed)
OT Cancellation Note  Patient Details Name: BRELAN FLAIG MRN: [id number] DOB: 09-Aug-1932   Cancelled Treatment:    Reason Eval/Treat Not Completed: Patient at procedure or test/ unavailable. Second attempt and pt is currently having procedure in room. First attempt pt was with PT. Will reattempt as schedule permits.  Pilar Grammes 03/12/2016, 10:21 AM

## 2016-03-12 NOTE — Progress Notes (Signed)
ANTICOAGULATION CONSULT NOTE - Follow Up Consult  Pharmacy Consult for heparin Indication: atrial fibrillation  No Known Allergies  Patient Measurements: Height: 6\' 1"  (185.4 cm) Weight: 189 lb 14.4 oz (86.1 kg) (scale a) IBW/kg (Calculated) : 79.9 Heparin Dosing Weight: 86.1 kg  Vital Signs: Temp: 97.6 F (36.4 C) (07/27 1803) Temp Source: Oral (07/27 1803) BP: 124/52 (07/27 1803) Pulse Rate: 69 (07/27 1803)  Labs:  Recent Labs  03/11/16 1641 03/11/16 1659 03/11/16 2140 03/12/16 0317 03/12/16 1749  HGB 12.0* 12.9*  --  12.3*  --   HCT 36.1* 38.0*  --  38.5*  --   PLT 151  --   --  184  --   LABPROT  --   --  14.3  --   --   INR  --   --  1.11  --   --   HEPARINUNFRC  --   --   --   --  0.55  CREATININE 1.69* 1.60*  --  1.55*  --     Estimated Creatinine Clearance: 42.2 mL/min (by C-G formula based on SCr of 1.55 mg/dL).   Medications:  Scheduled:  . aspirin EC  81 mg Oral Daily  . calcium-vitamin D  1 tablet Oral Q breakfast  . donepezil  10 mg Oral QHS  . memantine  10 mg Oral BID  . multivitamin  1 tablet Oral Daily  . multivitamin with minerals  1 tablet Oral Daily  . QUEtiapine  50 mg Oral QHS  . tamsulosin  0.4 mg Oral QHS  . trimethoprim  100 mg Oral Daily  . vitamin C  1,000 mg Oral Daily   Infusions:  . sodium chloride 125 mL/hr at 03/12/16 0620  . heparin 1,200 Units/hr (03/12/16 1011)    Assessment: 82 yo male with afib is currently on therapeutic heparin.  Heparin level is 0.55.  Goal of Therapy:  Heparin level 0.3-0.7 units/ml Monitor platelets by anticoagulation protocol: Yes   Plan:  - continue heparin at 1200 units/hr - daily heparin level and CBC - confirm in 8 hr  Cole Wilson, Tsz-Yin 03/12/2016,7:17 PM

## 2016-03-12 NOTE — Evaluation (Signed)
Physical Therapy Evaluation Patient Details Name: Cole Wilson MRN: [id number] DOB: 05-28-1932 Today's Date: 03/12/2016   History of Present Illness  82 y.o. male with h/o dementia, chronic renal disease, EtOH, depression admitted from home with pancreatitis, bradycardia.   Clinical Impression  Pt admitted with above diagnosis. Pt currently with functional limitations due to the deficits listed below (see PT Problem List). Pt ambulated 180' with RW with min/guard assist. Unclear what level of assistance he has at home as no family present and pt has h/o dementia. He reported that he's independent with ADLs and ambulation, however H&P states he requires some assist for ADLs. He is mildly unsteady without a RW and would benefit from a RW if he doesn't already have one. Supervision for mobility recommended.  Pt will benefit from skilled PT to increase their independence and safety with mobility to allow discharge to the venue listed below.       Follow Up Recommendations Home health PT    Equipment Recommendations  Rolling walker with 5" wheels    Recommendations for Other Services       Precautions / Restrictions Precautions Precautions: Fall Precaution Comments: pt denies h/o falls in past year Restrictions Weight Bearing Restrictions: No      Mobility  Bed Mobility Overal bed mobility: Modified Independent             General bed mobility comments: HOB up, used rail  Transfers Overall transfer level: Modified independent Equipment used: None             General transfer comment: used rail to push up  Ambulation/Gait Ambulation/Gait assistance: Min guard Ambulation Distance (Feet): 180 Feet Assistive device: Rolling walker (2 wheeled) Gait Pattern/deviations: Step-to pattern;Decreased step length - right;Decreased step length - left   Gait velocity interpretation: Below normal speed for age/gender General Gait Details: VCs to increase step length and for  positioning in RW especially during turns, attempted walking without AD initially, pt mildly unsteady without AD, steady with RW, pt states he walks without AD at baseline though no family present to confirm  Stairs            Wheelchair Mobility    Modified Rankin (Stroke Patients Only)       Balance Overall balance assessment: Needs assistance   Sitting balance-Leahy Scale: Good       Standing balance-Leahy Scale: Fair                               Pertinent Vitals/Pain Pain Assessment: No/denies pain    Home Living Family/patient expects to be discharged to:: Private residence Living Arrangements: Spouse/significant other Available Help at Discharge: Family;Available 24 hours/day   Home Access: Stairs to enter     Home Layout: Two level;Bed/bath upstairs Home Equipment: None Additional Comments: information provided by pt, family not present to confirm accuracy, noted h/o dementia in chart    Prior Function Level of Independence: Independent         Comments: information provided by pt, family not present to confirm accuracy, noted h/o dementia in chart     Hand Dominance        Extremity/Trunk Assessment   Upper Extremity Assessment: Defer to OT evaluation           Lower Extremity Assessment: Overall WFL for tasks assessed      Cervical / Trunk Assessment: Normal  Communication   Communication: No difficulties  Cognition Arousal/Alertness:  Lethargic Behavior During Therapy: Flat affect Overall Cognitive Status: No family/caregiver present to determine baseline cognitive functioning                      General Comments      Exercises        Assessment/Plan    PT Assessment Patient needs continued PT services  PT Diagnosis Difficulty walking   PT Problem List Decreased balance;Decreased mobility;Decreased knowledge of use of DME  PT Treatment Interventions Gait training;Stair training;Functional mobility  training;Balance training;Therapeutic activities   PT Goals (Current goals can be found in the Care Plan section) Acute Rehab PT Goals Patient Stated Goal: none stated PT Goal Formulation: With patient Time For Goal Achievement: 03/26/16 Potential to Achieve Goals: Good    Frequency Min 3X/week   Barriers to discharge        Co-evaluation               End of Session Equipment Utilized During Treatment: Gait belt Activity Tolerance: Patient tolerated treatment well Patient left: in chair;with call bell/phone within reach;with chair alarm set Nurse Communication: Mobility status    Functional Assessment Tool Used: clinical judgement Functional Limitation: Mobility: Walking and moving around Mobility: Walking and Moving Around Current Status [phone]): At least 1 percent but less than 20 percent impaired, limited or restricted Mobility: Walking and Moving Around Goal Status [phone]): 0 percent impaired, limited or restricted    Time: 1308-6578 PT Time Calculation (min) (ACUTE ONLY): 28 min   Charges:   PT Evaluation $PT Eval Low Complexity: 1 Procedure PT Treatments $Gait Training: 8-22 mins   PT G Codes:   PT G-Codes **NOT FOR INPATIENT CLASS** Functional Assessment Tool Used: clinical judgement Functional Limitation: Mobility: Walking and moving around Mobility: Walking and Moving Around Current Status ([id number]): At least 1 percent but less than 20 percent impaired, limited or restricted Mobility: Walking and Moving Around Goal Status [phone]): 0 percent impaired, limited or restricted    Tamala Ser 03/12/2016, 9:43 AM [phone]

## 2016-03-12 NOTE — Progress Notes (Signed)
Echocardiogram 2D Echocardiogram has been performed.  Cole Wilson 03/12/2016, 10:37 AM

## 2016-03-12 NOTE — Progress Notes (Signed)
Specimens sent to lab for MRSA surveillance screen and urine culture.  Results pending.

## 2016-03-12 NOTE — Progress Notes (Signed)
ANTICOAGULATION CONSULT NOTE - Initial Consult  Pharmacy Consult for Heparin  Indication: atrial fibrillation  No Known Allergies  Patient Measurements: Height: 6\' 1"  (185.4 cm) Weight: 189 lb 14.4 oz (86.1 kg) (scale a) IBW/kg (Calculated) : 79.9 Heparin Dosing Weight: 86.1 kg   Vital Signs: Temp: 97.9 F (36.6 C) (07/27 0546) Temp Source: Oral (07/27 0546) BP: 133/70 (07/27 0546) Pulse Rate: 62 (07/27 0546)  Labs:  Recent Labs  03/11/16 1641 03/11/16 1659 03/11/16 2140 03/12/16 0317  HGB 12.0* 12.9*  --  12.3*  HCT 36.1* 38.0*  --  38.5*  PLT 151  --   --  184  LABPROT  --   --  14.3  --   INR  --   --  1.11  --   CREATININE 1.69* 1.60*  --  1.55*    Estimated Creatinine Clearance: 42.2 mL/min (by C-G formula based on SCr of 1.55 mg/dL).   Medical History: Past Medical History:  Diagnosis Date  . CKD (chronic kidney disease), stage III   . Dementia   . Depression   . Pancreatitis     Assessment: 82 yo male admitted for abdominal pain, found to have acute pancreatitis. Pharmacy consulted for heparin for transient afib. Current chadsvasc score of 2. Prior to admission patient on ASA 81mg  which has been continued inpatient. Patient was not on other oral anticoagulation prior to admission. Patient is currently in afib on the monitor. CBC reviewed and stable. Pt received lovenox on 7/26 in PM. No signs and symptoms of bleeding.   Goal of Therapy:  Heparin level 0.3-0.7 units/ml Monitor platelets by anticoagulation protocol: Yes   Plan:  Discontinue lovenox  Give 4000 units bolus x 1 Start heparin infusion at 1200 units/hr Check anti-Xa level in 8 hours and daily while on heparin Continue to monitor H&H and platelets  Cherre Huger, PharmD Pharmacy Resident 03/12/2016,9:45 AM  Pager [phone]

## 2016-03-13 ENCOUNTER — Encounter (HOSPITAL_COMMUNITY): Payer: Self-pay | Admitting: Physician Assistant

## 2016-03-13 DIAGNOSIS — F0390 Unspecified dementia without behavioral disturbance: Secondary | ICD-10-CM

## 2016-03-13 DIAGNOSIS — K8500 Idiopathic acute pancreatitis without necrosis or infection: Secondary | ICD-10-CM

## 2016-03-13 DIAGNOSIS — I480 Paroxysmal atrial fibrillation: Secondary | ICD-10-CM

## 2016-03-13 DIAGNOSIS — R001 Bradycardia, unspecified: Secondary | ICD-10-CM

## 2016-03-13 LAB — COMPREHENSIVE METABOLIC PANEL
ALBUMIN: 3.2 g/dL — AB (ref 3.5–5.0)
ALT: 39 U/L (ref 17–63)
ANION GAP: 8 (ref 5–15)
AST: 44 U/L — AB (ref 15–41)
Alkaline Phosphatase: 96 U/L (ref 38–126)
BUN: 24 mg/dL — AB (ref 6–20)
CHLORIDE: 110 mmol/L (ref 101–111)
CO2: 24 mmol/L (ref 22–32)
Calcium: 8.6 mg/dL — ABNORMAL LOW (ref 8.9–10.3)
Creatinine, Ser: 1.46 mg/dL — ABNORMAL HIGH (ref 0.61–1.24)
GFR calc Af Amer: 50 mL/min — ABNORMAL LOW (ref >60)
GFR calc non Af Amer: 43 mL/min — ABNORMAL LOW (ref >60)
GLUCOSE: 77 mg/dL (ref 65–99)
POTASSIUM: 4.2 mmol/L (ref 3.5–5.1)
SODIUM: 142 mmol/L (ref 135–145)
Total Bilirubin: 0.6 mg/dL (ref 0.3–1.2)
Total Protein: 5.5 g/dL — ABNORMAL LOW (ref 6.5–8.1)

## 2016-03-13 LAB — CBC
HCT: 31.9 % — ABNORMAL LOW (ref 39.0–52.0)
Hemoglobin: 10.4 g/dL — ABNORMAL LOW (ref 13.0–17.0)
MCH: 28.7 pg (ref 26.0–34.0)
MCHC: 32.6 g/dL (ref 30.0–36.0)
MCV: 88.1 fL (ref 78.0–100.0)
PLATELETS: 143 10*3/uL — AB (ref 150–400)
RBC: 3.62 MIL/uL — AB (ref 4.22–5.81)
RDW: 14.5 % (ref 11.5–15.5)
WBC: 12.1 10*3/uL — AB (ref 4.0–10.5)

## 2016-03-13 LAB — URINE CULTURE

## 2016-03-13 LAB — LIPASE, BLOOD
LIPASE: 181 U/L — AB (ref 11–51)
Lipase: 436 U/L — ABNORMAL HIGH (ref 11–51)

## 2016-03-13 LAB — HEPARIN LEVEL (UNFRACTIONATED)
Heparin Unfractionated: 0.61 IU/mL (ref 0.30–0.70)
Heparin Unfractionated: 0.57 IU/mL (ref 0.30–0.70)

## 2016-03-13 LAB — GLUCOSE, CAPILLARY
GLUCOSE-CAPILLARY: 82 mg/dL (ref 65–99)
GLUCOSE-CAPILLARY: 89 mg/dL (ref 65–99)

## 2016-03-13 LAB — HEMOGLOBIN A1C
HEMOGLOBIN A1C: 5.3 % (ref 4.8–5.6)
MEAN PLASMA GLUCOSE: 105 mg/dL

## 2016-03-13 MED ORDER — CALCIUM CARBONATE-VITAMIN D 500-200 MG-UNIT PO TABS
1.0000 | ORAL_TABLET | Freq: Every day | ORAL | Status: DC
  Administered 2016-03-14 – 2016-03-18 (×5): 1 via ORAL
  Filled 2016-03-13 (×6): qty 1

## 2016-03-13 NOTE — Progress Notes (Signed)
Patient ID: Cole Wilson, male   DOB: 14-Mar-1932, 82 y.o.   MRN: [id number] [facility] Gastroenterology Progress Note  Cole Wilson 82 y.o. [birth year]   Subjective: Feels ok. Tolerating clears. Wife in room.  Objective: Vital signs in last 24 hours: Vitals:   03/13/16 0557 03/13/16 1134  BP: (!) 115/59 (!) 104/51  Pulse: 71 67  Resp: 20 16  Temp: 98 F (36.7 C) 97.6 F (36.4 C)    Physical Exam: Gen: demented, elderly, no acute distress HEENT: anicteric sclera CV: RRR Chest: CTA B Abd: upper quadrant tenderness with guarding, soft, nondistended, +BS  Lab Results:  Recent Labs  03/12/16 0317 03/13/16 0520  NA 138 142  K 4.4 4.2  CL 104 110  CO2 25 24  GLUCOSE 160* 77  BUN 25* 24*  CREATININE 1.55* 1.46*  CALCIUM 9.0 8.6*    Recent Labs  03/12/16 0317 03/13/16 0520  AST 120* 44*  ALT 75* 39  ALKPHOS 125 96  BILITOT 0.8 0.6  PROT 6.7 5.5*  ALBUMIN 3.7 3.2*    Recent Labs  03/12/16 0317 03/13/16 0520  WBC 9.3 12.1*  HGB 12.3* 10.4*  HCT 38.5* 31.9*  MCV 88.1 88.1  PLT 184 143*    Recent Labs  03/11/16 2140  LABPROT 14.3  INR 1.11      Assessment/Plan: Gallstone pancreatitis - clinically resolving. Lap chole needed due to recurrent pancreatitis. Surgery evaluating to decide on timing of surgery. Clear liquid diet. [facility] available to see again if needed. Will sign off otherwise.   Cole Wilson C. 03/13/2016, 1:17 PM  Pager [phone]  If no answer or after 5 PM call [phone]

## 2016-03-13 NOTE — Progress Notes (Addendum)
PROGRESS NOTE    Cole Wilson  GMW:[id number] DOB: 04-03-1932 DOA: 03/11/2016 PCP: [name], MD   Brief Narrative:  HPI on 03/11/2016 by [name] Cole Wilson is a 82 y.o. male with medical history significant of pancreatitis, dementia, depression, CKD-III, former alcohol user, who presents with abdominal pain.  Per his wife, patient started having abdominal pain at 12:00 PM. It is located in the periumbilical area, constant, 5 out of 10, dull, nonradiating. It is aggravated by deep breath. Patient does not have chest pain, SOB or cough. It is associated with diaphoresis. He has nausea, but no vomiting or diarrhea. Patient denies cough, fever or chills.  Of note, patient had recurrent UTI, and was started with daily trimethoprim for prophylaxis by urologist, [name] since last week. Patient denies symptoms of UTI. At arrival, pt was noted to be pale and diaphoretic with heart rate 45-50's sinus brady in ED. Of note, pt stopped drinking alcohol after he had second episode of pancreatitis last year.  Assessment & Plan   Acute on chronic pancreatitis/gallstones -Lipase on admission 5724, currently lipase 436 -[place] Abd: Mild wall thickening with small gallstones, fatty liver with mild biliary ductal dilatation, likely related to underlying pancreatitis.  -CT abd/pelvis findings are consistent with pancreatitis, and hepatobiliary system has no masses or other significant abnormality on CT scan -Continue pain control, IVF, antiemetics  -Triglycerides 70 -Gastroenterology consulted and appreciated  -General surgery consulted for possible lap chole -Diet advanced to clear liquid today  Atrial fibrillation -CHADSVASC 2 (age) -Patient not on anticoagulation -Recently saw his cardiologist at [facility], [name]: "Persistent atrial fibrillation (RAF-HCC) Was discovered during recent ER visit with sepsis and hypotension heart rate has been well controlled on no  treatment" -Spoke with [name], he only saw the patient one time. Thought Afib was due to UTI. Did not feel patient was a candidate for [facility] due to dementia.  -TSH and free T4 within normal limits -Echocardiogram EF55-60%, grade 1 diastolic dysfunction -Continue heparin -Discussed other NOAC options with patient's wife, she feels that would be appropriate -Case management consulted for insurance coverage -Currently patient is in sinus rhythm  Bradycardia -TSH/FT4 within normal limits -Resolved -Continue tele monitoring  Dementia -Continue Namenda, donepezil  Chronic kidney disease, stage III -Stable, baseline creatinine 1.6-1.8 -Creatinine currently 1.46 -Continue to monitor BMP  Depression  -Continue Seroquel  Recurrent urinary tract infection -Patient currently on TMP prophylaxis -UA showed small amount leukocytes -Urine culture shows multiple species   Code status -spoke with patient's wife regarding code status, she stated he is a [name]   DVT Prophylaxis  Heparin  Code Status: [name]  Family Communication: none at bedside, wife via phone  Disposition Plan: Admitted. Continue to treat pancreatitis.  Consultants Gastroenterology General surgery  Procedures  Echocardiogram  Antibiotics   Anti-infectives    Start     Dose/Rate Route Frequency Ordered Stop   03/11/16 2200  trimethoprim (TRIMPEX) tablet 100 mg     100 mg Oral Daily 03/11/16 1953        Subjective:   [name] seen and examined today.  Patient has dementia. No complaints today. Denies nausea, vomiting, abdominal pain, chest pain, shortness of breath.   Objective:   Vitals:   03/12/16 1217 03/12/16 1803 03/12/16 2029 03/13/16 0557  BP: (!) 131/56 (!) 124/52 127/60 (!) 115/59  Pulse: 68 69 79 71  Resp: 18 18 18 20   Temp: 97.7 F (36.5 C) 97.6 F (36.4  C) 97.6 F (36.4 C) 98 F (36.7 C)  TempSrc: Oral Oral Oral Oral  SpO2: 93% 98% 99% 97%  Weight:    85 kg (187 lb 8 oz)    Height:        Intake/Output Summary (Last 24 hours) at 03/13/16 1122 Last data filed at 03/13/16 0557  Gross per 24 hour  Intake          1312.92 ml  Output              575 ml  Net           737.92 ml   Filed Weights   03/11/16 2116 03/12/16 0546 03/13/16 0557  Weight: 82.8 kg (182 lb 9.6 oz) 86.1 kg (189 lb 14.4 oz) 85 kg (187 lb 8 oz)    Exam  General: Well developed, well nourished, NAD, appears stated age  HEENT: NCAT,  mucous membranes moist.   Cardiovascular: S1 S2 auscultated, RRR, 2/6SEM  Respiratory: Clear to auscultation bilaterally with equal chest rise  Abdomen: Soft, Epigastric TTP, nondistended, + bowel sounds  Extremities: warm dry without cyanosis clubbing or edema  Neuro: AAOx2, nonfocal  Skin: Without rashes exudates or nodules  Psych: Normal affect and demeanor, has dementia   Data Reviewed: I have personally reviewed following labs and imaging studies  CBC:  Recent Labs Lab 03/11/16 1641 03/11/16 1659 03/12/16 0317 03/13/16 0520  WBC 9.5  --  9.3 12.1*  HGB 12.0* 12.9* 12.3* 10.4*  HCT 36.1* 38.0* 38.5* 31.9*  MCV 88.0  --  88.1 88.1  PLT 151  --  184 143*   Basic Metabolic Panel:  Recent Labs Lab 03/11/16 1641 03/11/16 1659 03/12/16 0317 03/13/16 0520  NA 138 142 138 142  K 4.6 4.7 4.4 4.2  CL 106 104 104 110  CO2 23  --  25 24  GLUCOSE 149* 147* 160* 77  BUN 27* 32* 25* 24*  CREATININE 1.69* 1.60* 1.55* 1.46*  CALCIUM 9.4  --  9.0 8.6*   GFR: Estimated Creatinine Clearance: 44.8 mL/min (by C-G formula based on SCr of 1.46 mg/dL). Liver Function Tests:  Recent Labs Lab 03/11/16 1641 03/12/16 0317 03/13/16 0520  AST 104* 120* 44*  ALT 45 75* 39  ALKPHOS 121 125 96  BILITOT 1.0 0.8 0.6  PROT 6.5 6.7 5.5*  ALBUMIN 4.0 3.7 3.2*    Recent Labs Lab 03/11/16 1641 03/12/16 0745 03/13/16 0520  LIPASE 5,724* 2,805* 436*   No results for input(s): AMMONIA in the last 168 hours. Coagulation Profile:  Recent  Labs Lab 03/11/16 2140  INR 1.11   Cardiac Enzymes: No results for input(s): CKTOTAL, CKMB, CKMBINDEX, TROPONINI in the last 168 hours. BNP (last 3 results) No results for input(s): PROBNP in the last 8760 hours. HbA1C:  Recent Labs  03/12/16 0316  HGBA1C 5.3   CBG:  Recent Labs Lab 03/13/16 0548  GLUCAP 82   Lipid Profile:  Recent Labs  03/11/16 2140 03/12/16 0316  CHOL 210* 190  HDL 68 63  LDLCALC 127* 113*  TRIG 76 70  CHOLHDL 3.1 3.0   Thyroid Function Tests:  Recent Labs  03/12/16 0316 03/12/16 0317  TSH 2.556  --   FREET4  --  0.91   Anemia Panel: No results for input(s): VITAMINB12, FOLATE, FERRITIN, TIBC, IRON, RETICCTPCT in the last 72 hours. Urine analysis:    Component Value Date/Time   COLORURINE YELLOW 03/11/2016 1831   APPEARANCEUR CLEAR 03/11/2016 1831   LABSPEC 1.018  03/11/2016 1831   PHURINE 7.0 03/11/2016 1831   GLUCOSEU NEGATIVE 03/11/2016 1831   HGBUR NEGATIVE 03/11/2016 1831   BILIRUBINUR NEGATIVE 03/11/2016 1831   KETONESUR NEGATIVE 03/11/2016 1831   PROTEINUR NEGATIVE 03/11/2016 1831   UROBILINOGEN 0.2 06/21/2015 1800   NITRITE NEGATIVE 03/11/2016 1831   LEUKOCYTESUR SMALL (A) 03/11/2016 1831   Sepsis Labs: (procalcitonin:4,lacticidven:4)  ) Recent Results (from the past 240 hour(s))  Blood culture (routine x 2)     Status: None (Preliminary result)   Collection Time: 03/11/16  4:41 PM  Result Value Ref Range Status   Specimen Description BLOOD LEFT ANTECUBITAL  Final   Special Requests BOTTLES DRAWN AEROBIC AND ANAEROBIC 5CC  Final   Culture NO GROWTH < 24 HOURS  Final   Report Status PENDING  Incomplete  Blood culture (routine x 2)     Status: None (Preliminary result)   Collection Time: 03/11/16  5:12 PM  Result Value Ref Range Status   Specimen Description BLOOD RIGHT ARM  Final   Special Requests IN PEDIATRIC BOTTLE 1CC  Final   Culture NO GROWTH < 24 HOURS  Final   Report Status PENDING  Incomplete    Urine culture     Status: Abnormal   Collection Time: 03/11/16  6:32 PM  Result Value Ref Range Status   Specimen Description URINE, RANDOM  Final   Special Requests NONE  Final   Culture MULTIPLE SPECIES PRESENT, SUGGEST RECOLLECTION (A)  Final   Report Status 03/13/2016 FINAL  Final  MRSA PCR Screening     Status: None   Collection Time: 03/12/16  4:33 AM  Result Value Ref Range Status   MRSA by PCR NEGATIVE NEGATIVE Final    Comment:        The GeneXpert MRSA Assay (FDA approved for NASAL specimens only), is one component of a comprehensive MRSA colonization surveillance program. It is not intended to diagnose MRSA infection nor to guide or monitor treatment for MRSA infections.       Radiology Studies: US Abdomen Complete  Result Date: 03/11/2016 CLINICAL DATA:  Dull abdominal pain with pancreatitis on recent CT examination EXAM: ABDOMEN ULTRASOUND COMPLETE COMPARISON:  CT from earlier in the same day FINDINGS: Gallbladder: Well distended with mild gallbladder wall thickening to 3.8 mm. Some dependent tiny gallstones are seen. No pericholecystic fluid is noted. Common bile duct: Diameter: 5 mm. Liver: Slight increased echogenicity is noted consistent with fatty infiltration similar to that seen on recent CT examination. Minimal biliary ductal dilatation is seen likely related to the underlying pancreatitis. IVC: Not well visualized Pancreas: Not visualized due to overlying bowel gas. Spleen: Size and appearance within normal limits. Right Kidney: Length: 10.8 cm. 1.5 cm cyst is noted similar to that seen on recent CT examination. Left Kidney: Length: 11.6 cm. Echogenicity within normal limits. No mass or hydronephrosis visualized. Abdominal aorta: Not visualized due to overlying bowel gas. Other findings: None. IMPRESSION: Examination is limited due to overlying bowel gas. Mild wall thickening with small gallstones. Fatty liver with mild biliary ductal dilatation. The ductal  dilatation is likely related to the underlying pancreatitis. Electronically Signed   By: Alcide Clever M.D.   On: 03/11/2016 20:44  Ct Abdomen Pelvis W Contrast  Result Date: 03/11/2016 CLINICAL DATA:  Diffuse abdominal pain started at noon. EXAM: CT ABDOMEN AND PELVIS WITH CONTRAST TECHNIQUE: Multidetector CT imaging of the abdomen and pelvis was performed using the standard protocol following bolus administration of intravenous contrast. CONTRAST:  75mL ISOVUE-300  IOPAMIDOL (ISOVUE-300) INJECTION 61% COMPARISON:  None. FINDINGS: Lower chest:  Mild bibasilar atelectasis. Hepatobiliary: No masses or other significant abnormality. Pancreas: Severe peripancreatic inflammatory changes and fluid consistent with acute pancreatitis. Pancreas enhances normally and homogeneously without areas of pancreatic necrosis. Spleen: Within normal limits in size and appearance. Adrenals/Urinary Tract: Normal adrenal glands. Normal kidneys. Air within the bladder which may be secondary to instrumentation, but in the absence of instrumentation this can be seen with cystitis. Stomach/Bowel: No bowel wall thickening or dilatation. No pneumatosis, pneumoperitoneum or portal venous gas. Normal appendix. Colonic diverticulosis without evidence of diverticulitis. No pelvic free fluid. Vascular/Lymphatic: Abdominal aortic atherosclerosis. Normal caliber abdominal aorta. Other: None. Musculoskeletal: No aggressive lytic or sclerotic osseous lesion. No acute osseous abnormality. Severe bilateral facet arthropathy at L4-5 and L5-S1. Degenerative disc disease at L4-5 and L5-S1. IMPRESSION: 1. Findings consistent with acute pancreatitis. No focal fluid collection to suggest a pseudocyst or abscess. 2. Air within the bladder which may be secondary to instrumentation, but in the absence of instrumentation this can be seen with cystitis. 3. Diverticulosis without evidence of diverticulitis. Electronically Signed   By: Elige Ko   On: 03/11/2016  18:38  Dg Chest Portable 1 View  Result Date: 03/11/2016 CLINICAL DATA:  Chest discomfort EXAM: PORTABLE CHEST 1 VIEW COMPARISON:  12/29/2015 FINDINGS: The heart size and mediastinal contours are within normal limits. Both lungs are clear. The visualized skeletal structures are unremarkable. IMPRESSION: No active disease. Electronically Signed   By: Alcide Clever M.D.   On: 03/11/2016 17:50    Scheduled Meds: . aspirin EC  81 mg Oral Daily  . calcium-vitamin D  1 tablet Oral Q breakfast  . donepezil  10 mg Oral QHS  . memantine  10 mg Oral BID  . multivitamin  1 tablet Oral Daily  . multivitamin with minerals  1 tablet Oral Daily  . QUEtiapine  50 mg Oral QHS  . tamsulosin  0.4 mg Oral QHS  . trimethoprim  100 mg Oral Daily  . vitamin C  1,000 mg Oral Daily   Continuous Infusions: . sodium chloride 125 mL/hr at 03/12/16 2225  . heparin 1,200 Units/hr (03/13/16 0455)     LOS: 1 day   Time Spent in minutes   30 minutes  Candita Borenstein D.O. on 03/13/2016 at 11:22 AM  Between 7am to 7pm - Pager - [phone]  After 7pm go to [email or web address] - password [email or web address]  And look for the night coverage person covering for me after hours  [facility] Office  [phone]

## 2016-03-13 NOTE — Progress Notes (Signed)
Physical Therapy Treatment Patient Details Name: Cole Wilson MRN: [id number] DOB: Aug 17, 1932 Today's Date: 03/13/2016    History of Present Illness 82 y.o. male with h/o dementia, chronic renal disease, EtOH, depression admitted from home with pancreatitis, bradycardia.     PT Comments    Pt required increased assistance on today. Mod-high fall risk with ambulation. Intermittent freezing of gait and decreased step length bilaterally resulted in increased risk of falling forward on today. Per wife, pt is to possibly have surgery on Monday. Will continue to follow and progress activity as able.    Follow Up Recommendations  Home health PT;Supervision/Assistance - 24 hour (depending on progress. May need to reassess d/c recommendation after possible surgery)     Equipment Recommendations  Rolling walker with 5" wheels    Recommendations for Other Services       Precautions / Restrictions Precautions Precautions: Fall Precaution Comments: mod-high fall risk Restrictions Weight Bearing Restrictions: No    Mobility  Bed Mobility Overal bed mobility: Needs Assistance Bed Mobility: Supine to Sit;Sit to Supine     Supine to sit: Mod assist Sit to supine: Mod assist   General bed mobility comments: Assist for trunk and bil LEs. Increased time. Multimodal cueing for technique, completion of task  Transfers Overall transfer level: Needs assistance Equipment used: Rolling walker (2 wheeled) Transfers: Sit to/from Stand Sit to Stand: Max assist         General transfer comment: Multimodal cueing for hand/LE placement. Assist to shift weight anteriorly, rise, stabilize, control descent.   Ambulation/Gait Ambulation/Gait assistance: Min assist; +2 safety Ambulation Distance (Feet): 125 Feet Assistive device: Rolling walker (2 wheeled) Gait Pattern/deviations: Step-through pattern;Trunk flexed;Decreased step length - right;Decreased step length -  left;Festinating;Shuffle     General Gait Details: Parkinson's-like gait pattern. Noted pt had increased difficulty starting after stopping- "stuck in the mud"/gait freezing at times. Max multimodal cueing for posture, distance from walker, and step length. Assist to stabilize pt and maneuver safely with walker.   Stairs            Wheelchair Mobility    Modified Rankin (Stroke Patients Only)       Balance Overall balance assessment: Needs assistance Sitting-balance support: Bilateral upper extremity supported;Feet supported Sitting balance-Leahy Scale: Poor     Standing balance support: Bilateral upper extremity supported;During functional activity Standing balance-Leahy Scale: Poor                      Cognition Arousal/Alertness: Awake/alert Behavior During Therapy: Flat affect Overall Cognitive Status: History of cognitive impairments - at baseline Area of Impairment: Memory;Problem solving;Safety/judgement         Safety/Judgement: Decreased awareness of safety;Decreased awareness of deficits   Problem Solving: Requires tactile cues;Requires verbal cues;Slow processing      Exercises      General Comments General comments (skin integrity, edema, etc.): IV L hand with blood drainage. Rn called to room to address. wife very concerned with patient transfering to prevent break down of skin. WIfe reports high risk for skin breakdown      Pertinent Vitals/Pain Pain Assessment: No/denies pain    Home Living Family/patient expects to be discharged to:: Private residence Living Arrangements: Spouse/significant other Available Help at Discharge: Family;Available 24 hours/day Type of Home: Independent living facility Home Access: Stairs to enter   Home Layout: Two level;Bed/bath upstairs Home Equipment: Shower seat - built in Additional Comments: wife reports setting clothing out for patient but he is able  to complete dressing with alot of extra time.   wife reports he does all showering alone     Prior Function Level of Independence: Independent          PT Goals (current goals can now be found in the care plan section) Acute Rehab PT Goals Patient Stated Goal: none stated Progress towards PT goals: Progressing toward goals    Frequency  Min 3X/week    PT Plan Current plan remains appropriate    Co-evaluation             End of Session Equipment Utilized During Treatment: Gait belt Activity Tolerance: Patient tolerated treatment well Patient left: in bed;with call bell/phone within reach;with bed alarm set;with family/visitor present;with nursing/sitter in room     Time: 1350-1414 PT Time Calculation (min) (ACUTE ONLY): 24 min  Charges:  $Gait Training: 8-22 mins $Therapeutic Activity: 8-22 mins                    G Codes:      Rebeca Alert, MPT Pager: [phone]

## 2016-03-13 NOTE — Care Management Important Message (Signed)
Important Message  Patient Details  Name: Cole Wilson MRN: [id number] Date of Birth: 09-02-1931   Medicare Important Message Given:  Yes    Bernadette Hoit 03/13/2016, 10:35 AM

## 2016-03-13 NOTE — Progress Notes (Signed)
RE: Benefit check for Eliquis Received: Today Message Contents  Mardene Sayer CMA        [facility] # [phone]   ELIQUIS 2.50 MG BID (30 ) 60 TAB   COVER- YES  CO-PAY- $47.00  TIER- 2 DRUG  PRIOR APPROVAL- YES # [phone]  PHARMACY : [facility] , [facility] AND [facility]

## 2016-03-13 NOTE — Progress Notes (Signed)
ANTICOAGULATION CONSULT NOTE - Follow Up Consult  Pharmacy Consult for heparin Indication: atrial fibrillation  Labs:  Recent Labs  03/11/16 1641 03/11/16 1659 03/11/16 2140 03/12/16 0317 03/12/16 1749 03/12/16 2317  HGB 12.0* 12.9*  --  12.3*  --   --   HCT 36.1* 38.0*  --  38.5*  --   --   PLT 151  --   --  184  --   --   LABPROT  --   --  14.3  --   --   --   INR  --   --  1.11  --   --   --   HEPARINUNFRC  --   --   --   --  0.55 0.61  CREATININE 1.69* 1.60*  --  1.55*  --   --     Assessment/Plan:  82yo male remains therapeutic on heparin. Will continue gtt at current rate and monitor daily level.   Vernard Gambles, PharmD, BCPS  03/13/2016,2:00 AM

## 2016-03-13 NOTE — Consult Note (Signed)
CARDIOLOGY CONSULT NOTE   Patient ID: Cole Wilson MRN: [id number] DOB/AGE: [birth year] 82 y.o.  Admit date: 03/11/2016  Primary Physician   [name], MD Primary Cardiologist   New Reason for Consultation   Preop eval Requesting MD: Dr Catha Gosselin  OZH:[id number] Cole Wilson is a 82 y.o. year old male with a history of pancreatitis 2013 (no evidence of gallstones), pancreatitis 06/2015, colitis 2013, Paroxysmal atrial fibrillation (not anticoagulated), ?HTN, dementia, CKD III. He has been on Aricept and Namenda for memory problems.    Pt admitted 07/26 with pancreatitis. Surgical consult pending, GI has seen and dx gallstone pancreatitis. Lap chole may be needed, cardiac clearance for surgery requested.    Mr [name] lives at [place]. He is not very active. He does not exercise, has not gone up stairs recently. He walks to the dining room. He denies any chest pain with activity. He denies DOE, LE edema, orthopnea or PND. His only ongoing medical issue is abdominal.   Past Medical History:  Diagnosis Date  . CKD (chronic kidney disease), stage III   . Dementia   . Depression   . Pancreatitis      Past Surgical History:  Procedure Laterality Date  . ANKLE FUSION Right 1970/1990  . APPENDECTOMY    . JOINT REPLACEMENT    . KNEE ARTHROSCOPY Left 2002  . TONSILLECTOMY  1952  . TOTAL KNEE ARTHROPLASTY Left 2009    No Known Allergies  I have reviewed the patient's current medications . aspirin EC  81 mg Oral Daily  . calcium-vitamin D  1 tablet Oral Q breakfast  . donepezil  10 mg Oral QHS  . memantine  10 mg Oral BID  . multivitamin  1 tablet Oral Daily  . multivitamin with minerals  1 tablet Oral Daily  . QUEtiapine  50 mg Oral QHS  . tamsulosin  0.4 mg Oral QHS  . trimethoprim  100 mg Oral Daily  . vitamin C  1,000 mg Oral Daily   . sodium chloride 125 mL/hr at 03/12/16 2225  . heparin 1,200 Units/hr (03/13/16 0455)   morphine injection,  ondansetron, oxyCODONE  Prior to Admission medications   Medication Sig Start Date End Date Taking? Authorizing Provider  acetaminophen (TYLENOL) 160 MG/5ML liquid Take 325 mg by mouth every 4 (four) hours as needed for fever.   Yes Historical Provider, MD  amoxicillin (AMOXIL) 500 MG capsule Take 1 capsule (500 mg total) by mouth every 12 (twelve) hours. 11/21/15  Yes Calvert Cantor, MD  Ascorbic Acid (VITAMIN C) 1000 MG tablet Take 1,000 mg by mouth daily.   Yes Historical Provider, MD  aspirin 81 MG tablet Take 81 mg by mouth daily.   Yes Historical Provider, MD  Calcium Carb-Cholecalciferol (CALCIUM 600+D) 600-800 MG-UNIT TABS Take 1 tablet by mouth daily.   Yes Historical Provider, MD  donepezil (ARICEPT) 10 MG tablet Take 1 tablet (10 mg total) by mouth at bedtime. 11/11/15  Yes York Spaniel, MD  Glucosamine-Chondroit-Vit C-Mn (GLUCOSAMINE 1500 COMPLEX PO) Take 1 tablet by mouth daily.   Yes Historical Provider, MD  memantine (NAMENDA) 10 MG tablet TAKE 1 TABLET BY MOUTH TWICE DAILY 12/20/15  Yes York Spaniel, MD  Multiple Vitamin (MULTIVITAMIN) capsule Take 1 capsule by mouth daily.     Yes Historical Provider, MD  Multiple Vitamins-Minerals (VITEYES AREDS FORMULA) CAPS Take 1 capsule by mouth daily.    Yes Historical Provider, MD  QUEtiapine (SEROQUEL) 50 MG tablet Take  75 mg by mouth at bedtime.   Yes Historical Provider, MD  tamsulosin (FLOMAX) 0.4 MG CAPS capsule Take 1 capsule (0.4 mg total) by mouth at bedtime. 11/21/15  Yes Calvert Cantor, MD  TRIMETHOPRIM PO Take 100 mg by mouth at bedtime.    Yes Historical Provider, MD  feeding supplement, ENSURE ENLIVE, (ENSURE ENLIVE) LIQD Take 237 mLs by mouth 2 (two) times daily between meals. 11/21/15   Calvert Cantor, MD     Social History   Social History  . Marital status: Married    Spouse name: N/A  . Number of children: 3  . Years of education: MA   Occupational History  . retired    Social History Main Topics  . Smoking status:  Never Smoker  . Smokeless tobacco: Never Used  . Alcohol use No  . Drug use: No  . Sexual activity: Not on file   Other Topics Concern  . Not on file   Social History Narrative   Patient lives at home with his wife [name]) They both live at [place].   Retired.   Education Masters.   Right handed.   Caffeine one cup of tea daily.       Quit alcohol after pancreatitis x 2 last yr.  2016    Family Status  Relation Status  . Mother Deceased at age 61   cancer  . Father Deceased at age 34   lung cancer  . Brother Deceased at age 36   colitis  . Sister Deceased at age 67   "old age""  . Paternal Grandmother Deceased at age 39's   Family History  Problem Relation Age of Onset  . Cancer Mother     breast, uterine  . Cancer - Lung Father   . Dementia Paternal Grandmother      ROS:  Full 14 point review of systems complete and found to be negative unless listed above.  Physical Exam: Blood pressure (!) 104/51, pulse 67, temperature 97.6 F (36.4 C), temperature source Oral, resp. rate 16, height 6\' 1"  (1.854 m), weight 187 lb 8 oz (85 kg), SpO2 93 %.  General: Well developed, well nourished, male in no acute distress Head: Eyes PERRLA, No xanthomas.   Normocephalic and atraumatic, oropharynx without edema or exudate. Dentition: poor Lungs: clear bilaterally Heart: HRRR S1 S2, no rub/gallop, soft murmur. pulses are 2+ all 4 extrem.   Neck: No carotid bruits. No lymphadenopathy.  JVD not elevated Abdomen: Bowel sounds present, abdomen soft and non-tender without masses or hernias noted. Msk:  No spine or cva tenderness. No weakness, no joint deformities or effusions. Extremities: No clubbing or cyanosis. No edema.  Neuro: Alert and oriented X 2. No focal deficits noted. Psych:  Good affect, responds appropriately Skin: No rashes or lesions noted.  Labs:   Lab Results  Component Value Date   WBC 12.1 (H) 03/13/2016   HGB 10.4 (L) 03/13/2016   HCT 31.9 (L)  03/13/2016   MCV 88.1 03/13/2016   PLT 143 (L) 03/13/2016    Recent Labs  03/11/16 2140  INR 1.11    Recent Labs Lab 03/13/16 0520  NA 142  K 4.2  CL 110  CO2 24  BUN 24*  CREATININE 1.46*  CALCIUM 8.6*  PROT 5.5*  BILITOT 0.6  ALKPHOS 96  ALT 39  AST 44*  GLUCOSE 77  ALBUMIN 3.2*    Recent Labs  03/11/16 1658 03/11/16 2007  TROPIPOC 0.00 0.00   B Natriuretic Peptide  Date/Time Value Ref Range Status  03/11/2016 09:40 PM 73.0 0.0 - 100.0 pg/mL Final  06/21/2015 10:03 AM 176.3 (H) 0.0 - 100.0 pg/mL Final   Lipase  Date/Time Value Ref Range Status  03/13/2016 11:46 AM 181 (H) 11 - 51 U/L Final   TSH  Date/Time Value Ref Range Status  03/12/2016 03:16 AM 2.556 0.350 - 4.500 uIU/mL Final  11/03/2013 09:55 AM 2.130 0.450 - 4.500 uIU/mL Final   Echo: 07/27 - Left ventricle: The cavity size was normal. Systolic function was   normal. The estimated ejection fraction was in the range of 55%   to 60%. Wall motion was normal; there were no regional wall   motion abnormalities. Doppler parameters are consistent with   abnormal left ventricular relaxation (grade 1 diastolic   dysfunction). - Aortic valve: Valve mobility was restricted. There was mild   stenosis. Peak velocity (S): 213 cm/s. Mean gradient (S): 10 mm   Hg. Valve area (Vmean): 2.02 cm^2. - Aorta: Ascending aortic diameter: 40 mm (S). - Ascending aorta: The ascending aorta was mildly dilated. - Mitral valve: Calcified annulus.  ECG:  07/27 Mena Pauls, HR 59 Sinus arrhythmia and 1 PAC No acute ischemic changes  Radiology:  US Abdomen Complete Result Date: 03/11/2016 CLINICAL DATA:  Dull abdominal pain with pancreatitis on recent CT examination EXAM: ABDOMEN ULTRASOUND COMPLETE COMPARISON:  CT from earlier in the same day FINDINGS: Gallbladder: Well distended with mild gallbladder wall thickening to 3.8 mm. Some dependent tiny gallstones are seen. No pericholecystic fluid is noted. Common bile duct:  Diameter: 5 mm. Liver: Slight increased echogenicity is noted consistent with fatty infiltration similar to that seen on recent CT examination. Minimal biliary ductal dilatation is seen likely related to the underlying pancreatitis. IVC: Not well visualized Pancreas: Not visualized due to overlying bowel gas. Spleen: Size and appearance within normal limits. Right Kidney: Length: 10.8 cm. 1.5 cm cyst is noted similar to that seen on recent CT examination. Left Kidney: Length: 11.6 cm. Echogenicity within normal limits. No mass or hydronephrosis visualized. Abdominal aorta: Not visualized due to overlying bowel gas. Other findings: None. IMPRESSION: Examination is limited due to overlying bowel gas. Mild wall thickening with small gallstones. Fatty liver with mild biliary ductal dilatation. The ductal dilatation is likely related to the underlying pancreatitis. Electronically Signed   By: Alcide Clever M.D.   On: 03/11/2016 20:44  Ct Abdomen Pelvis W Contrast Result Date: 03/11/2016 CLINICAL DATA:  Diffuse abdominal pain started at noon. EXAM: CT ABDOMEN AND PELVIS WITH CONTRAST TECHNIQUE: Multidetector CT imaging of the abdomen and pelvis was performed using the standard protocol following bolus administration of intravenous contrast. CONTRAST:  75mL ISOVUE-300 IOPAMIDOL (ISOVUE-300) INJECTION 61% COMPARISON:  None. FINDINGS: Lower chest:  Mild bibasilar atelectasis. Hepatobiliary: No masses or other significant abnormality. Pancreas: Severe peripancreatic inflammatory changes and fluid consistent with acute pancreatitis. Pancreas enhances normally and homogeneously without areas of pancreatic necrosis. Spleen: Within normal limits in size and appearance. Adrenals/Urinary Tract: Normal adrenal glands. Normal kidneys. Air within the bladder which may be secondary to instrumentation, but in the absence of instrumentation this can be seen with cystitis. Stomach/Bowel: No bowel wall thickening or dilatation. No  pneumatosis, pneumoperitoneum or portal venous gas. Normal appendix. Colonic diverticulosis without evidence of diverticulitis. No pelvic free fluid. Vascular/Lymphatic: Abdominal aortic atherosclerosis. Normal caliber abdominal aorta. Other: None. Musculoskeletal: No aggressive lytic or sclerotic osseous lesion. No acute osseous abnormality. Severe bilateral facet arthropathy at L4-5 and L5-S1. Degenerative disc disease at  L4-5 and L5-S1. IMPRESSION: 1. Findings consistent with acute pancreatitis. No focal fluid collection to suggest a pseudocyst or abscess. 2. Air within the bladder which may be secondary to instrumentation, but in the absence of instrumentation this can be seen with cystitis. 3. Diverticulosis without evidence of diverticulitis. Electronically Signed   By: Elige Ko   On: 03/11/2016 18:38  Dg Chest Portable 1 View Result Date: 03/11/2016 CLINICAL DATA:  Chest discomfort EXAM: PORTABLE CHEST 1 VIEW COMPARISON:  12/29/2015 FINDINGS: The heart size and mediastinal contours are within normal limits. Both lungs are clear. The visualized skeletal structures are unremarkable. IMPRESSION: No active disease. Electronically Signed   By: Alcide Clever M.D.   On: 03/11/2016 17:50   ASSESSMENT AND PLAN:   The patient was seen today by Dr Katrinka Blazing, the patient evaluated and the data reviewed.  Principal Problem:   Pancreatitis - see Dr Fatima Sanger note, surgery this admit    Preoperative cardiac evaluation - pt with no ongoing ischemic sx, but activity level not very high - EF normal w/ no WMA by echo, grade 1 dd, mild AoV stenosis - would watch volume carefully during surgery, he is at risk for volume overload. - He is at increased risk for surgery because of his multiple medical problems, but he is currently well-compensated and no further evaluation is needed at this time.   Otherwise, per IM  Active Problems:   Dementia without behavioral disturbance   CKD (chronic kidney disease), stage  III   Bradycardia - sinus, no rate-lowering rx   Depression   Signed: Leanna Battles 03/13/2016 2:36 PM Beeper [phone]  Co-Sign MD  The patient has been seen in conjunction with Theodore Demark, PA-C. All aspects of care have been considered and discussed. The patient has been personally interviewed, examined, and all clinical data has been reviewed.   The patient is elderly and has multiple comorbidities including profound dementia. We are asked for preoperative cardiovascular evaluation in preparation for possible laparoscopic cholecystectomy.  Cardiovascular issues include tachycardia-bradycardia syndrome (with demonstrated prior PAF and sinus bradycardia), hypertension, and chronic kidney disease stage III. Dementia prevents accurate history, but he denies active chest discomfort, dyspnea, syncope, edema, and prior heart ailments.  On exam, there is no JVD or evidence of volume overload. He has a 2/6 systolic murmur compatible with aortic outflow. Lungs are clear. ECG on 7/26 reveals sinus bradycardia. Chest x-ray reveals no significant evidence of heart failure.  We have cleared the patient for the upcoming anesthesia and laparoscopic cholecystectomy. The patient will be at increased risk for cardiovascular complications due to significant comorbidities. No specific cardiac evaluation is needed. The patient is clinically stable without evidence of heart failure, ongoing ischemia, or significant arrhythmia.

## 2016-03-13 NOTE — Consult Note (Signed)
Reason for Consult:  Gallstone pancreatitis Referring Physician: Dr. Lawana Chambers is an 82 y.o. male.  HPI: This is an 82 year old male with severe dementia and chronic kidney disease who presents with his third episode of pancreatitis in the last 2 years.  He has a previous history of heavy alcohol use but he is stop drinking over the last year.  He was admitted on 03/11/16 with severe pancreatitis with a lipase of 5724. CT scan of the abdomen and pelvis was consistent with pancreatitis with no complications. Ultrasound showed mild wall thickening with multiple small gallstones as well as fatty liver. He is gradually improving. He reports no abdominal pain. We are asked to see the patient to consider surgery since he has had recurrent episodes of pancreatitis felt to be secondary to gallstones. His referring doctor understands that he is at elevated risk for surgery because of his comorbidities.    Past Medical History:  Diagnosis Date  . CKD (chronic kidney disease), stage III   . Dementia   . Depression   . Pancreatitis     Past Surgical History:  Procedure Laterality Date  . ANKLE FUSION Right 1970/1990  . APPENDECTOMY    . KNEE ARTHROSCOPY Left 2002  . TONSILLECTOMY  1952  . TOTAL KNEE ARTHROPLASTY Left 2009    Family History  Problem Relation Age of Onset  . Cancer Mother     breast, uterine  . Cancer - Lung Father   . Dementia Paternal Grandmother     Social History:  reports that he has never smoked. He has never used smokeless tobacco. He reports that he does not drink alcohol or use drugs.  Allergies: No Known Allergies  Medications:  Prior to Admission medications   Medication Sig Start Date End Date Taking? Authorizing Provider  acetaminophen (TYLENOL) 160 MG/5ML liquid Take 325 mg by mouth every 4 (four) hours as needed for fever.   Yes Historical Provider, MD  amoxicillin (AMOXIL) 500 MG capsule Take 1 capsule (500 mg total) by mouth every 12  (twelve) hours. 11/21/15  Yes Debbe Odea, MD  Ascorbic Acid (VITAMIN C) 1000 MG tablet Take 1,000 mg by mouth daily.   Yes Historical Provider, MD  aspirin 81 MG tablet Take 81 mg by mouth daily.   Yes Historical Provider, MD  Calcium Carb-Cholecalciferol (CALCIUM 600+D) 600-800 MG-UNIT TABS Take 1 tablet by mouth daily.   Yes Historical Provider, MD  donepezil (ARICEPT) 10 MG tablet Take 1 tablet (10 mg total) by mouth at bedtime. 11/11/15  Yes Kathrynn Ducking, MD  Glucosamine-Chondroit-Vit C-Mn (GLUCOSAMINE 1500 COMPLEX PO) Take 1 tablet by mouth daily.   Yes Historical Provider, MD  memantine (NAMENDA) 10 MG tablet TAKE 1 TABLET BY MOUTH TWICE DAILY 12/20/15  Yes Kathrynn Ducking, MD  Multiple Vitamin (MULTIVITAMIN) capsule Take 1 capsule by mouth daily.     Yes Historical Provider, MD  Multiple Vitamins-Minerals (VITEYES AREDS FORMULA) CAPS Take 1 capsule by mouth daily.    Yes Historical Provider, MD  QUEtiapine (SEROQUEL) 50 MG tablet Take 75 mg by mouth at bedtime.   Yes Historical Provider, MD  tamsulosin (FLOMAX) 0.4 MG CAPS capsule Take 1 capsule (0.4 mg total) by mouth at bedtime. 11/21/15  Yes Debbe Odea, MD  TRIMETHOPRIM PO Take 100 mg by mouth at bedtime.    Yes Historical Provider, MD  feeding supplement, ENSURE ENLIVE, (ENSURE ENLIVE) LIQD Take 237 mLs by mouth 2 (two) times daily between meals. 11/21/15  Debbe Odea, MD     Results for orders placed or performed during the hospital encounter of 03/11/16 (from the past 48 hour(s))  Lipase, blood     Status: Abnormal   Collection Time: 03/11/16  4:41 PM  Result Value Ref Range   Lipase 5,724 (H) 11 - 51 U/L    Comment: RESULTS CONFIRMED BY MANUAL DILUTION  Comprehensive metabolic panel     Status: Abnormal   Collection Time: 03/11/16  4:41 PM  Result Value Ref Range   Sodium 138 135 - 145 mmol/L   Potassium 4.6 3.5 - 5.1 mmol/L   Chloride 106 101 - 111 mmol/L   CO2 23 22 - 32 mmol/L   Glucose, Bld 149 (H) 65 - 99 mg/dL    BUN 27 (H) 6 - 20 mg/dL   Creatinine, Ser 1.69 (H) 0.61 - 1.24 mg/dL   Calcium 9.4 8.9 - 10.3 mg/dL   Total Protein 6.5 6.5 - 8.1 g/dL   Albumin 4.0 3.5 - 5.0 g/dL   AST 104 (H) 15 - 41 U/L   ALT 45 17 - 63 U/L   Alkaline Phosphatase 121 38 - 126 U/L   Total Bilirubin 1.0 0.3 - 1.2 mg/dL   GFR calc non Af Amer 36 (L) >60 mL/min   GFR calc Af Amer 42 (L) >60 mL/min    Comment: (NOTE) The eGFR has been calculated using the CKD EPI equation. This calculation has not been validated in all clinical situations. eGFR's persistently <60 mL/min signify possible Chronic Kidney Disease.    Anion gap 9 5 - 15  CBC     Status: Abnormal   Collection Time: 03/11/16  4:41 PM  Result Value Ref Range   WBC 9.5 4.0 - 10.5 K/uL   RBC 4.10 (L) 4.22 - 5.81 MIL/uL   Hemoglobin 12.0 (L) 13.0 - 17.0 g/dL   HCT 36.1 (L) 39.0 - 52.0 %   MCV 88.0 78.0 - 100.0 fL   MCH 29.3 26.0 - 34.0 pg   MCHC 33.2 30.0 - 36.0 g/dL   RDW 13.8 11.5 - 15.5 %   Platelets 151 150 - 400 K/uL  Blood culture (routine x 2)     Status: None (Preliminary result)   Collection Time: 03/11/16  4:41 PM  Result Value Ref Range   Specimen Description BLOOD LEFT ANTECUBITAL    Special Requests BOTTLES DRAWN AEROBIC AND ANAEROBIC 5CC    Culture NO GROWTH < 24 HOURS    Report Status PENDING   I-Stat Troponin, ED - 0, 3, 6 hours (not at [facility])     Status: None   Collection Time: 03/11/16  4:58 PM  Result Value Ref Range   Troponin i, poc 0.00 0.00 - 0.08 ng/mL   Comment 3            Comment: Due to the release kinetics of cTnI, a negative result within the first hours of the onset of symptoms does not rule out myocardial infarction with certainty. If myocardial infarction is still suspected, repeat the test at appropriate intervals.   I-Stat Chem 8, ED     Status: Abnormal   Collection Time: 03/11/16  4:59 PM  Result Value Ref Range   Sodium 142 135 - 145 mmol/L   Potassium 4.7 3.5 - 5.1 mmol/L   Chloride 104 101 - 111 mmol/L    BUN 32 (H) 6 - 20 mg/dL   Creatinine, Ser 1.60 (H) 0.61 - 1.24 mg/dL   Glucose, Bld 147 (  H) 65 - 99 mg/dL   Calcium, Ion 1.22 1.12 - 1.23 mmol/L   TCO2 27 0 - 100 mmol/L   Hemoglobin 12.9 (L) 13.0 - 17.0 g/dL   HCT 38.0 (L) 39.0 - 52.0 %  I-Stat CG4 Lactic Acid, ED     Status: None   Collection Time: 03/11/16  5:00 PM  Result Value Ref Range   Lactic Acid, Venous 1.34 0.5 - 1.9 mmol/L  Blood culture (routine x 2)     Status: None (Preliminary result)   Collection Time: 03/11/16  5:12 PM  Result Value Ref Range   Specimen Description BLOOD RIGHT ARM    Special Requests IN PEDIATRIC BOTTLE 1CC    Culture NO GROWTH < 24 HOURS    Report Status PENDING   Urinalysis, Routine w reflex microscopic     Status: Abnormal   Collection Time: 03/11/16  6:31 PM  Result Value Ref Range   Color, Urine YELLOW YELLOW   APPearance CLEAR CLEAR   Specific Gravity, Urine 1.018 1.005 - 1.030   pH 7.0 5.0 - 8.0   Glucose, UA NEGATIVE NEGATIVE mg/dL   Hgb urine dipstick NEGATIVE NEGATIVE   Bilirubin Urine NEGATIVE NEGATIVE   Ketones, ur NEGATIVE NEGATIVE mg/dL   Protein, ur NEGATIVE NEGATIVE mg/dL   Nitrite NEGATIVE NEGATIVE   Leukocytes, UA SMALL (A) NEGATIVE  Urine microscopic-add on     Status: Abnormal   Collection Time: 03/11/16  6:31 PM  Result Value Ref Range   Squamous Epithelial / LPF 0-5 (A) NONE SEEN   WBC, UA 6-30 0 - 5 WBC/hpf   RBC / HPF 0-5 0 - 5 RBC/hpf   Bacteria, UA FEW (A) NONE SEEN  Urine culture     Status: Abnormal   Collection Time: 03/11/16  6:32 PM  Result Value Ref Range   Specimen Description URINE, RANDOM    Special Requests NONE    Culture MULTIPLE SPECIES PRESENT, SUGGEST RECOLLECTION (A)    Report Status 03/13/2016 FINAL   I-Stat Troponin, ED - 0, 3, 6 hours (not at [facility])     Status: None   Collection Time: 03/11/16  8:07 PM  Result Value Ref Range   Troponin i, poc 0.00 0.00 - 0.08 ng/mL   Comment 3            Comment: Due to the release kinetics of cTnI, a  negative result within the first hours of the onset of symptoms does not rule out myocardial infarction with certainty. If myocardial infarction is still suspected, repeat the test at appropriate intervals.   Lipid panel     Status: Abnormal   Collection Time: 03/11/16  9:40 PM  Result Value Ref Range   Cholesterol 210 (H) 0 - 200 mg/dL   Triglycerides 76 <150 mg/dL   HDL 68 >40 mg/dL   Total CHOL/HDL Ratio 3.1 RATIO   VLDL 15 0 - 40 mg/dL   LDL Cholesterol 127 (H) 0 - 99 mg/dL    Comment:        Total Cholesterol/HDL:CHD Risk Coronary Heart Disease Risk Table                     Men   Women  1/2 Average Risk   3.4   3.3  Average Risk       5.0   4.4  2 X Average Risk   9.6   7.1  3 X Average Risk  23.4   11.0  Use the calculated Patient Ratio above and the CHD Risk Table to determine the patient's CHD Risk.        ATP III CLASSIFICATION (LDL):  <100     mg/dL   Optimal  100-129  mg/dL   Near or Above                    Optimal  130-159  mg/dL   Borderline  160-189  mg/dL   High  >190     mg/dL   Very High   Brain natriuretic peptide     Status: None   Collection Time: 03/11/16  9:40 PM  Result Value Ref Range   B Natriuretic Peptide 73.0 0.0 - 100.0 pg/mL  Protime-INR     Status: None   Collection Time: 03/11/16  9:40 PM  Result Value Ref Range   Prothrombin Time 14.3 11.4 - 15.2 seconds   INR 1.11   Hemoglobin A1c     Status: None   Collection Time: 03/12/16  3:16 AM  Result Value Ref Range   Hgb A1c MFr Bld 5.3 4.8 - 5.6 %    Comment: (NOTE)         Pre-diabetes: 5.7 - 6.4         Diabetes: >6.4         Glycemic control for adults with diabetes: <7.0    Mean Plasma Glucose 105 mg/dL    Comment: (NOTE) Performed At: [facility] [place], [place] [id number] Lindon Romp MD HQ:[id number]   Lipid panel     Status: Abnormal   Collection Time: 03/12/16  3:16 AM  Result Value Ref Range   Cholesterol 190 0 - 200 mg/dL    Triglycerides 70 <150 mg/dL   HDL 63 >40 mg/dL   Total CHOL/HDL Ratio 3.0 RATIO   VLDL 14 0 - 40 mg/dL   LDL Cholesterol 113 (H) 0 - 99 mg/dL    Comment:        Total Cholesterol/HDL:CHD Risk Coronary Heart Disease Risk Table                     Men   Women  1/2 Average Risk   3.4   3.3  Average Risk       5.0   4.4  2 X Average Risk   9.6   7.1  3 X Average Risk  23.4   11.0        Use the calculated Patient Ratio above and the CHD Risk Table to determine the patient's CHD Risk.        ATP III CLASSIFICATION (LDL):  <100     mg/dL   Optimal  100-129  mg/dL   Near or Above                    Optimal  130-159  mg/dL   Borderline  160-189  mg/dL   High  >190     mg/dL   Very High   TSH     Status: None   Collection Time: 03/12/16  3:16 AM  Result Value Ref Range   TSH 2.556 0.350 - 4.500 uIU/mL  T4, free     Status: None   Collection Time: 03/12/16  3:17 AM  Result Value Ref Range   Free T4 0.91 0.61 - 1.12 ng/dL    Comment: (NOTE) Biotin ingestion may interfere with free T4 tests. If the results are inconsistent with  the TSH level, previous test results, or the clinical presentation, then consider biotin interference. If needed, order repeat testing after stopping biotin.   Comprehensive metabolic panel     Status: Abnormal   Collection Time: 03/12/16  3:17 AM  Result Value Ref Range   Sodium 138 135 - 145 mmol/L   Potassium 4.4 3.5 - 5.1 mmol/L   Chloride 104 101 - 111 mmol/L   CO2 25 22 - 32 mmol/L   Glucose, Bld 160 (H) 65 - 99 mg/dL   BUN 25 (H) 6 - 20 mg/dL   Creatinine, Ser 1.55 (H) 0.61 - 1.24 mg/dL   Calcium 9.0 8.9 - 10.3 mg/dL   Total Protein 6.7 6.5 - 8.1 g/dL   Albumin 3.7 3.5 - 5.0 g/dL   AST 120 (H) 15 - 41 U/L   ALT 75 (H) 17 - 63 U/L   Alkaline Phosphatase 125 38 - 126 U/L   Total Bilirubin 0.8 0.3 - 1.2 mg/dL   GFR calc non Af Amer 40 (L) >60 mL/min   GFR calc Af Amer 47 (L) >60 mL/min    Comment: (NOTE) The eGFR has been calculated using  the CKD EPI equation. This calculation has not been validated in all clinical situations. eGFR's persistently <60 mL/min signify possible Chronic Kidney Disease.    Anion gap 9 5 - 15  CBC     Status: Abnormal   Collection Time: 03/12/16  3:17 AM  Result Value Ref Range   WBC 9.3 4.0 - 10.5 K/uL   RBC 4.37 4.22 - 5.81 MIL/uL   Hemoglobin 12.3 (L) 13.0 - 17.0 g/dL   HCT 38.5 (L) 39.0 - 52.0 %   MCV 88.1 78.0 - 100.0 fL   MCH 28.1 26.0 - 34.0 pg   MCHC 31.9 30.0 - 36.0 g/dL   RDW 13.7 11.5 - 15.5 %   Platelets 184 150 - 400 K/uL  MRSA PCR Screening     Status: None   Collection Time: 03/12/16  4:33 AM  Result Value Ref Range   MRSA by PCR NEGATIVE NEGATIVE    Comment:        The GeneXpert MRSA Assay (FDA approved for NASAL specimens only), is one component of a comprehensive MRSA colonization surveillance program. It is not intended to diagnose MRSA infection nor to guide or monitor treatment for MRSA infections.   Lipase, blood     Status: Abnormal   Collection Time: 03/12/16  7:45 AM  Result Value Ref Range   Lipase 2,805 (H) 11 - 51 U/L    Comment: RESULTS CONFIRMED BY MANUAL DILUTION  Heparin level (unfractionated)     Status: None   Collection Time: 03/12/16  5:49 PM  Result Value Ref Range   Heparin Unfractionated 0.55 0.30 - 0.70 IU/mL    Comment:        IF HEPARIN RESULTS ARE BELOW EXPECTED VALUES, AND PATIENT DOSAGE HAS BEEN CONFIRMED, SUGGEST FOLLOW UP TESTING OF ANTITHROMBIN III LEVELS.   Heparin level (unfractionated)     Status: None   Collection Time: 03/12/16 11:17 PM  Result Value Ref Range   Heparin Unfractionated 0.61 0.30 - 0.70 IU/mL    Comment:        IF HEPARIN RESULTS ARE BELOW EXPECTED VALUES, AND PATIENT DOSAGE HAS BEEN CONFIRMED, SUGGEST FOLLOW UP TESTING OF ANTITHROMBIN III LEVELS.   CBC     Status: Abnormal   Collection Time: 03/13/16  5:20 AM  Result Value Ref Range   WBC  12.1 (H) 4.0 - 10.5 K/uL   RBC 3.62 (L) 4.22 - 5.81  MIL/uL   Hemoglobin 10.4 (L) 13.0 - 17.0 g/dL   HCT 31.9 (L) 39.0 - 52.0 %   MCV 88.1 78.0 - 100.0 fL   MCH 28.7 26.0 - 34.0 pg   MCHC 32.6 30.0 - 36.0 g/dL   RDW 14.5 11.5 - 15.5 %   Platelets 143 (L) 150 - 400 K/uL  Heparin level (unfractionated)     Status: None   Collection Time: 03/13/16  5:20 AM  Result Value Ref Range   Heparin Unfractionated 0.57 0.30 - 0.70 IU/mL    Comment:        IF HEPARIN RESULTS ARE BELOW EXPECTED VALUES, AND PATIENT DOSAGE HAS BEEN CONFIRMED, SUGGEST FOLLOW UP TESTING OF ANTITHROMBIN III LEVELS.   Comprehensive metabolic panel     Status: Abnormal   Collection Time: 03/13/16  5:20 AM  Result Value Ref Range   Sodium 142 135 - 145 mmol/L   Potassium 4.2 3.5 - 5.1 mmol/L   Chloride 110 101 - 111 mmol/L   CO2 24 22 - 32 mmol/L   Glucose, Bld 77 65 - 99 mg/dL   BUN 24 (H) 6 - 20 mg/dL   Creatinine, Ser 1.46 (H) 0.61 - 1.24 mg/dL   Calcium 8.6 (L) 8.9 - 10.3 mg/dL   Total Protein 5.5 (L) 6.5 - 8.1 g/dL   Albumin 3.2 (L) 3.5 - 5.0 g/dL   AST 44 (H) 15 - 41 U/L   ALT 39 17 - 63 U/L   Alkaline Phosphatase 96 38 - 126 U/L   Total Bilirubin 0.6 0.3 - 1.2 mg/dL   GFR calc non Af Amer 43 (L) >60 mL/min   GFR calc Af Amer 50 (L) >60 mL/min    Comment: (NOTE) The eGFR has been calculated using the CKD EPI equation. This calculation has not been validated in all clinical situations. eGFR's persistently <60 mL/min signify possible Chronic Kidney Disease.    Anion gap 8 5 - 15  Lipase, blood     Status: Abnormal   Collection Time: 03/13/16  5:20 AM  Result Value Ref Range   Lipase 436 (H) 11 - 51 U/L    Comment: RESULTS CONFIRMED BY MANUAL DILUTION  Glucose, capillary     Status: None   Collection Time: 03/13/16  5:48 AM  Result Value Ref Range   Glucose-Capillary 82 65 - 99 mg/dL   Comment 1 Notify RN    Comment 2 Document in Chart   Lipase, blood     Status: Abnormal   Collection Time: 03/13/16 11:46 AM  Result Value Ref Range   Lipase 181  (H) 11 - 51 U/L    US Abdomen Complete  Result Date: 03/11/2016 CLINICAL DATA:  Dull abdominal pain with pancreatitis on recent CT examination EXAM: ABDOMEN ULTRASOUND COMPLETE COMPARISON:  CT from earlier in the same day FINDINGS: Gallbladder: Well distended with mild gallbladder wall thickening to 3.8 mm. Some dependent tiny gallstones are seen. No pericholecystic fluid is noted. Common bile duct: Diameter: 5 mm. Liver: Slight increased echogenicity is noted consistent with fatty infiltration similar to that seen on recent CT examination. Minimal biliary ductal dilatation is seen likely related to the underlying pancreatitis. IVC: Not well visualized Pancreas: Not visualized due to overlying bowel gas. Spleen: Size and appearance within normal limits. Right Kidney: Length: 10.8 cm. 1.5 cm cyst is noted similar to that seen on recent CT examination. Left Kidney: Length:  11.6 cm. Echogenicity within normal limits. No mass or hydronephrosis visualized. Abdominal aorta: Not visualized due to overlying bowel gas. Other findings: None. IMPRESSION: Examination is limited due to overlying bowel gas. Mild wall thickening with small gallstones. Fatty liver with mild biliary ductal dilatation. The ductal dilatation is likely related to the underlying pancreatitis. Electronically Signed   By: Inez Catalina M.D.   On: 03/11/2016 20:44  Ct Abdomen Pelvis W Contrast  Result Date: 03/11/2016 CLINICAL DATA:  Diffuse abdominal pain started at noon. EXAM: CT ABDOMEN AND PELVIS WITH CONTRAST TECHNIQUE: Multidetector CT imaging of the abdomen and pelvis was performed using the standard protocol following bolus administration of intravenous contrast. CONTRAST:  58m ISOVUE-300 IOPAMIDOL (ISOVUE-300) INJECTION 61% COMPARISON:  None. FINDINGS: Lower chest:  Mild bibasilar atelectasis. Hepatobiliary: No masses or other significant abnormality. Pancreas: Severe peripancreatic inflammatory changes and fluid consistent with acute  pancreatitis. Pancreas enhances normally and homogeneously without areas of pancreatic necrosis. Spleen: Within normal limits in size and appearance. Adrenals/Urinary Tract: Normal adrenal glands. Normal kidneys. Air within the bladder which may be secondary to instrumentation, but in the absence of instrumentation this can be seen with cystitis. Stomach/Bowel: No bowel wall thickening or dilatation. No pneumatosis, pneumoperitoneum or portal venous gas. Normal appendix. Colonic diverticulosis without evidence of diverticulitis. No pelvic free fluid. Vascular/Lymphatic: Abdominal aortic atherosclerosis. Normal caliber abdominal aorta. Other: None. Musculoskeletal: No aggressive lytic or sclerotic osseous lesion. No acute osseous abnormality. Severe bilateral facet arthropathy at L4-5 and L5-S1. Degenerative disc disease at L4-5 and L5-S1. IMPRESSION: 1. Findings consistent with acute pancreatitis. No focal fluid collection to suggest a pseudocyst or abscess. 2. Air within the bladder which may be secondary to instrumentation, but in the absence of instrumentation this can be seen with cystitis. 3. Diverticulosis without evidence of diverticulitis. Electronically Signed   By: HKathreen Devoid  On: 03/11/2016 18:38  Dg Chest Portable 1 View  Result Date: 03/11/2016 CLINICAL DATA:  Chest discomfort EXAM: PORTABLE CHEST 1 VIEW COMPARISON:  12/29/2015 FINDINGS: The heart size and mediastinal contours are within normal limits. Both lungs are clear. The visualized skeletal structures are unremarkable. IMPRESSION: No active disease. Electronically Signed   By: MInez CatalinaM.D.   On: 03/11/2016 17:50   Review of Systems  Constitutional: Negative for weight loss.  HENT: Negative for ear discharge, ear pain, hearing loss and tinnitus.   Eyes: Negative for blurred vision, double vision, photophobia and pain.  Respiratory: Negative for cough, sputum production and shortness of breath.   Cardiovascular: Negative for  chest pain.  Gastrointestinal: Positive for abdominal pain. Negative for nausea and vomiting.  Genitourinary: Negative for dysuria, flank pain, frequency and urgency.  Musculoskeletal: Negative for back pain, falls, joint pain, myalgias and neck pain.  Neurological: Negative for dizziness, tingling, sensory change, focal weakness, loss of consciousness and headaches.  Endo/Heme/Allergies: Does not bruise/bleed easily.  Psychiatric/Behavioral: Negative for depression, memory loss and substance abuse. The patient is not nervous/anxious.    Blood pressure (!) 104/51, pulse 67, temperature 97.6 F (36.4 C), temperature source Oral, resp. rate 16, height 6' 1"  (1.854 m), weight 85 kg (187 lb 8 oz), SpO2 93 %. Physical Exam WDWN in NAD HEENT:  EOMI, sclera anicteric Neck:  No masses, no thyromegaly Lungs:  CTA bilaterally; normal respiratory effort CV:  Regular rate and rhythm; no murmurs Abd:  +bowel sounds, soft, mild distention; non-tender Ext:  Well-perfused; no edema Skin:  Warm, dry; no sign of jaundice  Assessment/Plan: Biliary pancreatitis -  recurrent - seems to be improving Patient is asymptomatic at this time.  His abdomen is soft.  Recs:  Would not advance diet past clears.  If the primary team feels that he is an acceptable risk for surgery, will need laparoscopic cholecystectomy this admission.  I discussed this with the patient's wife, as he suffers from dementia.  His risk is slightly elevated but is not prohibitive for this type of surgery.  The timing of the surgery will depend on the trend of his lipase as well as the weekend schedule.  This may not happen until next week.   Jakarie Pember K. 03/13/2016, 2:43 PM

## 2016-03-14 LAB — COMPREHENSIVE METABOLIC PANEL
ALBUMIN: 3.3 g/dL — AB (ref 3.5–5.0)
ALT: 31 U/L (ref 17–63)
AST: 38 U/L (ref 15–41)
Alkaline Phosphatase: 103 U/L (ref 38–126)
Anion gap: 8 (ref 5–15)
BILIRUBIN TOTAL: 0.7 mg/dL (ref 0.3–1.2)
BUN: 17 mg/dL (ref 6–20)
CO2: 21 mmol/L — ABNORMAL LOW (ref 22–32)
Calcium: 8.5 mg/dL — ABNORMAL LOW (ref 8.9–10.3)
Chloride: 109 mmol/L (ref 101–111)
Creatinine, Ser: 1.23 mg/dL (ref 0.61–1.24)
GFR calc Af Amer: >60 mL/min (ref >60)
GFR calc non Af Amer: 53 mL/min — ABNORMAL LOW (ref >60)
GLUCOSE: 63 mg/dL — AB (ref 65–99)
POTASSIUM: 3.8 mmol/L (ref 3.5–5.1)
Sodium: 138 mmol/L (ref 135–145)
TOTAL PROTEIN: 6.1 g/dL — AB (ref 6.5–8.1)

## 2016-03-14 LAB — CBC
HEMATOCRIT: 34.3 % — AB (ref 39.0–52.0)
HEMOGLOBIN: 10.4 g/dL — AB (ref 13.0–17.0)
MCH: 27.7 pg (ref 26.0–34.0)
MCHC: 30.3 g/dL (ref 30.0–36.0)
MCV: 91.2 fL (ref 78.0–100.0)
PLATELETS: 137 10*3/uL — AB (ref 150–400)
RBC: 3.76 MIL/uL — AB (ref 4.22–5.81)
RDW: 14.4 % (ref 11.5–15.5)
WBC: 10.9 10*3/uL — AB (ref 4.0–10.5)

## 2016-03-14 LAB — GLUCOSE, CAPILLARY
GLUCOSE-CAPILLARY: 91 mg/dL (ref 65–99)
GLUCOSE-CAPILLARY: 86 mg/dL (ref 65–99)
Glucose-Capillary: 70 mg/dL (ref 65–99)
Glucose-Capillary: 92 mg/dL (ref 65–99)

## 2016-03-14 LAB — HEPARIN LEVEL (UNFRACTIONATED): Heparin Unfractionated: 0.42 IU/mL (ref 0.30–0.70)

## 2016-03-14 NOTE — Progress Notes (Signed)
PROGRESS NOTE    [name]  [name]:[id number] DOB: 1932/04/02 DOA: 03/11/2016 PCP: [name], MD   Brief Narrative:  HPI on 03/11/2016 by [name] Cole Wilson is a 82 y.o. male with medical history significant of pancreatitis, dementia, depression, CKD-III, former alcohol user, who presents with abdominal pain.  Per his wife, patient started having abdominal pain at 12:00 PM. It is located in the periumbilical area, constant, 5 out of 10, dull, nonradiating. It is aggravated by deep breath. Patient does not have chest pain, SOB or cough. It is associated with diaphoresis. He has nausea, but no vomiting or diarrhea. Patient denies cough, fever or chills.  Of note, patient had recurrent UTI, and was started with daily trimethoprim for prophylaxis by urologist, [name] since last week. Patient denies symptoms of UTI. At arrival, pt was noted to be pale and diaphoretic with heart rate 45-50's sinus brady in ED. Of note, pt stopped drinking alcohol after he had second episode of pancreatitis last year.  Assessment & Plan   Acute on chronic pancreatitis/gallstones -Lipase on admission 5724, currently lipase 181 -[place] Abd: Mild wall thickening with small gallstones, fatty liver with mild biliary ductal dilatation, likely related to underlying pancreatitis.  -CT abd/pelvis findings are consistent with pancreatitis, and hepatobiliary system has no masses or other significant abnormality on CT scan -Continue pain control, IVF, antiemetics  -Triglycerides 70 -Gastroenterology consulted and appreciated  -General surgery consulted for possible lap chole on Monday, 7/31 -Diet advanced to clear liquid today -Cardiology consulted for preop assessment, he is at increased risk due to multiple medical problems but is currently stable for surgery  Atrial fibrillation -CHADSVASC 2 (age) -Patient not on anticoagulation -Recently saw his cardiologist at [name], [name]:  "Persistent atrial fibrillation (RAF-HCC) Was discovered during recent ER visit with sepsis and hypotension heart rate has been well controlled on no treatment" -Spoke with [name], he only saw the patient one time. Thought Afib was due to UTI. Did not feel patient was a candidate for [facility] due to dementia.  -TSH and free T4 within normal limits -Echocardiogram EF55-60%, grade 1 diastolic dysfunction -Continue heparin -Discussed other NOAC options with patient's wife, she feels that would be appropriate -Case management consulted for insurance coverage -Currently patient is in sinus rhythm  Bradycardia -TSH/FT4 within normal limits -Resolved -Continue tele monitoring  Dementia -Continue Namenda, donepezil  Chronic kidney disease, stage III -Stable, baseline creatinine 1.6-1.8 -Creatinine currently 1.23 -Continue to monitor BMP  Depression  -Continue Seroquel  Recurrent urinary tract infection -Patient currently on TMP prophylaxis -UA showed small amount leukocytes -Urine culture shows multiple species   Code status -spoke with patient's wife regarding code status, she stated he is a [name]   DVT Prophylaxis  Heparin  Code Status: [name]  Family Communication: none at bedside, wife via phone  Disposition Plan: Admitted. Continue to treat pancreatitis. Surgery 7/31  Consultants Gastroenterology General surgery Cardiology  Procedures  Echocardiogram  Antibiotics   Anti-infectives    Start     Dose/Rate Route Frequency Ordered Stop   03/11/16 2200  trimethoprim (TRIMPEX) tablet 100 mg     100 mg Oral Daily 03/11/16 1953        Subjective:   [name] seen and examined today.  Patient has dementia. No complaints today. Denies nausea, vomiting, abdominal pain, chest pain, shortness of breath.   Objective:   Vitals:   03/13/16 0557 03/13/16 1134 03/13/16 2219 03/14/16 0629  BP: [name])  115/59 (!) 104/51 (!) 118/43 113/64  Pulse: 71 67 76 72  Resp: 20 16 16  18   Temp: 98 F (36.7 C) 97.6 F (36.4 C) 98.9 F (37.2 C) 100 F (37.8 C)  TempSrc: Oral Oral Oral Oral  SpO2: 97% 93% 95% 96%  Weight: 85 kg (187 lb 8 oz)   86.8 kg (191 lb 6.4 oz)  Height:        Intake/Output Summary (Last 24 hours) at 03/14/16 1043 Last data filed at 03/14/16 9924  Gross per 24 hour  Intake          5546.58 ml  Output             1000 ml  Net          4546.58 ml   Filed Weights   03/12/16 0546 03/13/16 0557 03/14/16 0629  Weight: 86.1 kg (189 lb 14.4 oz) 85 kg (187 lb 8 oz) 86.8 kg (191 lb 6.4 oz)    Exam  General: Well developed, well nourished, NAD, appears stated age  HEENT: NCAT,  mucous membranes moist.   Cardiovascular: S1 S2 auscultated, RRR, 2/6SEM  Respiratory: Clear to auscultation bilaterally with equal chest rise  Abdomen: Soft, Epigastric TTP, nondistended, + bowel sounds  Extremities: warm dry without cyanosis clubbing or edema  Neuro: AAOx2, nonfocal  Skin: Without rashes exudates or nodules  Psych: Normal affect and demeanor, has dementia   Data Reviewed: I have personally reviewed following labs and imaging studies  CBC:  Recent Labs Lab 03/11/16 1641 03/11/16 1659 03/12/16 0317 03/13/16 0520 03/14/16 0320  WBC 9.5  --  9.3 12.1* 10.9*  HGB 12.0* 12.9* 12.3* 10.4* 10.4*  HCT 36.1* 38.0* 38.5* 31.9* 34.3*  MCV 88.0  --  88.1 88.1 91.2  PLT 151  --  184 143* 137*   Basic Metabolic Panel:  Recent Labs Lab 03/11/16 1641 03/11/16 1659 03/12/16 0317 03/13/16 0520 03/14/16 0320  NA 138 142 138 142 138  K 4.6 4.7 4.4 4.2 3.8  CL 106 104 104 110 109  CO2 23  --  25 24 21*  GLUCOSE 149* 147* 160* 77 63*  BUN 27* 32* 25* 24* 17  CREATININE 1.69* 1.60* 1.55* 1.46* 1.23  CALCIUM 9.4  --  9.0 8.6* 8.5*   GFR: Estimated Creatinine Clearance: 53.2 mL/min (by C-G formula based on SCr of 1.23 mg/dL). Liver Function Tests:  Recent Labs Lab 03/11/16 1641 03/12/16 0317 03/13/16 0520 03/14/16 0320  AST 104*  120* 44* 38  ALT 45 75* 39 31  ALKPHOS 121 125 96 103  BILITOT 1.0 0.8 0.6 0.7  PROT 6.5 6.7 5.5* 6.1*  ALBUMIN 4.0 3.7 3.2* 3.3*    Recent Labs Lab 03/11/16 1641 03/12/16 0745 03/13/16 0520 03/13/16 1146  LIPASE 5,724* 2,805* 436* 181*   No results for input(s): AMMONIA in the last 168 hours. Coagulation Profile:  Recent Labs Lab 03/11/16 2140  INR 1.11   Cardiac Enzymes: No results for input(s): CKTOTAL, CKMB, CKMBINDEX, TROPONINI in the last 168 hours. BNP (last 3 results) No results for input(s): PROBNP in the last 8760 hours. HbA1C:  Recent Labs  03/12/16 0316  HGBA1C 5.3   CBG:  Recent Labs Lab 03/13/16 0548 03/13/16 2225 03/14/16 0655  GLUCAP 82 89 70   Lipid Profile:  Recent Labs  03/11/16 2140 03/12/16 0316  CHOL 210* 190  HDL 68 63  LDLCALC 127* 113*  TRIG 76 70  CHOLHDL 3.1 3.0   Thyroid Function Tests:  Recent Labs  03/12/16 0316 03/12/16 0317  TSH 2.556  --   FREET4  --  0.91   Anemia Panel: No results for input(s): VITAMINB12, FOLATE, FERRITIN, TIBC, IRON, RETICCTPCT in the last 72 hours. Urine analysis:    Component Value Date/Time   COLORURINE YELLOW 03/11/2016 1831   APPEARANCEUR CLEAR 03/11/2016 1831   LABSPEC 1.018 03/11/2016 1831   PHURINE 7.0 03/11/2016 1831   GLUCOSEU NEGATIVE 03/11/2016 1831   HGBUR NEGATIVE 03/11/2016 1831   BILIRUBINUR NEGATIVE 03/11/2016 1831   KETONESUR NEGATIVE 03/11/2016 1831   PROTEINUR NEGATIVE 03/11/2016 1831   UROBILINOGEN 0.2 06/21/2015 1800   NITRITE NEGATIVE 03/11/2016 1831   LEUKOCYTESUR SMALL (A) 03/11/2016 1831   Sepsis Labs: @LABRCNTIP (procalcitonin:4,lacticidven:4)  ) Recent Results (from the past 240 hour(s))  Blood culture (routine x 2)     Status: None (Preliminary result)   Collection Time: 03/11/16  4:41 PM  Result Value Ref Range Status   Specimen Description BLOOD LEFT ANTECUBITAL  Final   Special Requests BOTTLES DRAWN AEROBIC AND ANAEROBIC 5CC  Final    Culture NO GROWTH 2 DAYS  Final   Report Status PENDING  Incomplete  Blood culture (routine x 2)     Status: None (Preliminary result)   Collection Time: 03/11/16  5:12 PM  Result Value Ref Range Status   Specimen Description BLOOD RIGHT ARM  Final   Special Requests IN PEDIATRIC BOTTLE 1CC  Final   Culture NO GROWTH 2 DAYS  Final   Report Status PENDING  Incomplete  Urine culture     Status: Abnormal   Collection Time: 03/11/16  6:32 PM  Result Value Ref Range Status   Specimen Description URINE, RANDOM  Final   Special Requests NONE  Final   Culture MULTIPLE SPECIES PRESENT, SUGGEST RECOLLECTION (A)  Final   Report Status 03/13/2016 FINAL  Final  MRSA PCR Screening     Status: None   Collection Time: 03/12/16  4:33 AM  Result Value Ref Range Status   MRSA by PCR NEGATIVE NEGATIVE Final    Comment:        The GeneXpert MRSA Assay (FDA approved for NASAL specimens only), is one component of a comprehensive MRSA colonization surveillance program. It is not intended to diagnose MRSA infection nor to guide or monitor treatment for MRSA infections.       Radiology Studies: No results found.   Scheduled Meds: . aspirin EC  81 mg Oral Daily  . calcium-vitamin D  1 tablet Oral Q breakfast  . donepezil  10 mg Oral QHS  . memantine  10 mg Oral BID  . multivitamin  1 tablet Oral Daily  . multivitamin with minerals  1 tablet Oral Daily  . QUEtiapine  50 mg Oral QHS  . tamsulosin  0.4 mg Oral QHS  . trimethoprim  100 mg Oral Daily  . vitamin C  1,000 mg Oral Daily   Continuous Infusions: . sodium chloride 125 mL/hr at 03/14/16 0854  . heparin 1,200 Units/hr (03/14/16 0415)     LOS: 2 days   Time Spent in minutes   30 minutes  Maxx Calaway D.O. on 03/14/2016 at 10:43 AM  Between 7am to 7pm - Pager - [phone]  After 7pm go to [email or web address] - password [email or web address]  And look for the night coverage person covering for me after hours  [facility] Office   [phone]

## 2016-03-14 NOTE — Progress Notes (Signed)
  Subjective: Pt doing well. Some abd pain tol CLD  Objective: Vital signs in last 24 hours: Temp:  [97.6 F (36.4 C)-100 F (37.8 C)] 100 F (37.8 C) (07/29 0629) Pulse Rate:  [67-76] 72 (07/29 0629) Resp:  [16-18] 18 (07/29 0629) BP: (104-118)/(43-64) 113/64 (07/29 0629) SpO2:  [93 %-96 %] 96 % (07/29 0629) Weight:  [86.8 kg (191 lb 6.4 oz)] 86.8 kg (191 lb 6.4 oz) (07/29 0629) Last BM Date: 03/10/16  Intake/Output from previous day: 07/28 0701 - 07/29 0700 In: 5546.6 [P.O.:900; I.V.:4646.6] Out: 1000 [Urine:1000] Intake/Output this shift: No intake/output data recorded.  General appearance: alert and cooperative GI: soft, epigastric pain, nd, no reboubnd/guarding  Lab Results:   Recent Labs  03/13/16 0520 03/14/16 0320  WBC 12.1* 10.9*  HGB 10.4* 10.4*  HCT 31.9* 34.3*  PLT 143* 137*   BMET  Recent Labs  03/13/16 0520 03/14/16 0320  NA 142 138  K 4.2 3.8  CL 110 109  CO2 24 21*  GLUCOSE 77 63*  BUN 24* 17  CREATININE 1.46* 1.23  CALCIUM 8.6* 8.5*   PT/INR  Recent Labs  03/11/16 2140  LABPROT 14.3  INR 1.11   ABG No results for input(s): PHART, HCO3 in the last 72 hours.  Invalid input(s): PCO2, PO2  Studies/Results: No results found.  Anti-infectives: Anti-infectives    Start     Dose/Rate Route Frequency Ordered Stop   03/11/16 2200  trimethoprim (TRIMPEX) tablet 100 mg     100 mg Oral Daily 03/11/16 1953        Assessment/Plan: Biliary pancreatitis - recurrent  Principal Problem:   Pancreatitis Active Problems:   Dementia without behavioral disturbance   CKD (chronic kidney disease), stage III   Bradycardia   Depression   Paroxysmal atrial fibrillation (HCC)  Pain con't to impriove, Lipase decreasing approp Plan for OR monday   LOS: 2 days    Marigene Ehlers., Jed Limerick 03/14/2016

## 2016-03-14 NOTE — Progress Notes (Signed)
ANTICOAGULATION CONSULT NOTE - Follow Up Consult  Pharmacy Consult for heparin Indication: atrial fibrillation  No Known Allergies  Patient Measurements: Height: 6\' 1"  (185.4 cm) Weight: 191 lb 6.4 oz (86.8 kg) (scale c) IBW/kg (Calculated) : 79.9 Heparin Dosing Weight: 86.1 kg  Vital Signs: Temp: 100 F (37.8 C) (07/29 0629) Temp Source: Oral (07/29 0629) BP: 113/64 (07/29 0629) Pulse Rate: 72 (07/29 0629)  Labs:  Recent Labs  03/11/16 2140 03/12/16 0317  03/12/16 2317 03/13/16 0520 03/14/16 0320  HGB  --  12.3*  --   --  10.4* 10.4*  HCT  --  38.5*  --   --  31.9* 34.3*  PLT  --  184  --   --  143* 137*  LABPROT 14.3  --   --   --   --   --   INR 1.11  --   --   --   --   --   HEPARINUNFRC  --   --   < > 0.61 0.57 0.42  CREATININE  --  1.55*  --   --  1.46* 1.23  < > = values in this interval not displayed.  Estimated Creatinine Clearance: 53.2 mL/min (by C-G formula based on SCr of 1.23 mg/dL).   Medications:  Scheduled:  . aspirin EC  81 mg Oral Daily  . calcium-vitamin D  1 tablet Oral Q breakfast  . donepezil  10 mg Oral QHS  . memantine  10 mg Oral BID  . multivitamin  1 tablet Oral Daily  . multivitamin with minerals  1 tablet Oral Daily  . QUEtiapine  50 mg Oral QHS  . tamsulosin  0.4 mg Oral QHS  . trimethoprim  100 mg Oral Daily  . vitamin C  1,000 mg Oral Daily   Infusions:  . sodium chloride 125 mL/hr at 03/14/16 0854  . heparin 1,200 Units/hr (03/14/16 0415)    Assessment: 82 yo male with afib that is currently on heparin until laparoscopic cholecystectomy that is planned for Monday (7/31).   HL remains therapeutic on 1200 units/hr. CBC stable.   Goal of Therapy:  Heparin level 0.3-0.7 units/ml Monitor platelets by anticoagulation protocol: Yes   Plan:  - Continue heparin at 1200 units/hr - Daily heparin level and CBC   Pollyann Samples, PharmD, BCPS 03/14/2016, 10:37 AM Pager: [phone]

## 2016-03-15 LAB — BASIC METABOLIC PANEL
ANION GAP: 3 — AB (ref 5–15)
BUN: 14 mg/dL (ref 6–20)
CHLORIDE: 112 mmol/L — AB (ref 101–111)
CO2: 20 mmol/L — AB (ref 22–32)
Calcium: 7.8 mg/dL — ABNORMAL LOW (ref 8.9–10.3)
Creatinine, Ser: 1.15 mg/dL (ref 0.61–1.24)
GFR calc non Af Amer: 58 mL/min — ABNORMAL LOW (ref >60)
Glucose, Bld: 150 mg/dL — ABNORMAL HIGH (ref 65–99)
POTASSIUM: 3.4 mmol/L — AB (ref 3.5–5.1)
SODIUM: 135 mmol/L (ref 135–145)

## 2016-03-15 LAB — LIPASE, BLOOD: Lipase: 25 U/L (ref 11–51)

## 2016-03-15 LAB — CBC
HEMATOCRIT: 28.9 % — AB (ref 39.0–52.0)
HEMOGLOBIN: 9.3 g/dL — AB (ref 13.0–17.0)
MCH: 28.7 pg (ref 26.0–34.0)
MCHC: 32.2 g/dL (ref 30.0–36.0)
MCV: 89.2 fL (ref 78.0–100.0)
Platelets: 204 10*3/uL (ref 150–400)
RBC: 3.24 MIL/uL — ABNORMAL LOW (ref 4.22–5.81)
RDW: 14.8 % (ref 11.5–15.5)
WBC: 20.6 10*3/uL — ABNORMAL HIGH (ref 4.0–10.5)

## 2016-03-15 LAB — GLUCOSE, CAPILLARY
GLUCOSE-CAPILLARY: 105 mg/dL — AB (ref 65–99)
Glucose-Capillary: 90 mg/dL (ref 65–99)
Glucose-Capillary: 124 mg/dL — ABNORMAL HIGH (ref 65–99)
Glucose-Capillary: 105 mg/dL — ABNORMAL HIGH (ref 65–99)

## 2016-03-15 LAB — HEPARIN LEVEL (UNFRACTIONATED)
Heparin Unfractionated: 0.24 IU/mL — ABNORMAL LOW (ref 0.30–0.70)
Heparin Unfractionated: 0.18 IU/mL — ABNORMAL LOW (ref 0.30–0.70)

## 2016-03-15 MED ORDER — CEFAZOLIN SODIUM-DEXTROSE 2-4 GM/100ML-% IV SOLN
2.0000 g | INTRAVENOUS | Status: DC
  Filled 2016-03-15: qty 100

## 2016-03-15 MED ORDER — CHLORHEXIDINE GLUCONATE CLOTH 2 % EX PADS
6.0000 | MEDICATED_PAD | Freq: Once | CUTANEOUS | Status: AC
  Administered 2016-03-15: 6 via TOPICAL

## 2016-03-15 MED ORDER — ACETAMINOPHEN 500 MG PO TABS: 1000.0000 mg | ORAL_TABLET | ORAL | Status: DC

## 2016-03-15 MED ORDER — POTASSIUM CHLORIDE CRYS ER 20 MEQ PO TBCR
40.0000 meq | EXTENDED_RELEASE_TABLET | Freq: Once | ORAL | Status: AC
  Administered 2016-03-15: 40 meq via ORAL
  Filled 2016-03-15: qty 2

## 2016-03-15 MED ORDER — GABAPENTIN 300 MG PO CAPS: 300.0000 mg | ORAL_CAPSULE | ORAL | Status: DC

## 2016-03-15 MED ORDER — METRONIDAZOLE IN NACL 5-0.79 MG/ML-% IV SOLN: 500.0000 mg | INTRAVENOUS | Status: DC

## 2016-03-15 NOTE — Progress Notes (Signed)
ANTICOAGULATION CONSULT NOTE - Follow Up Consult  Pharmacy Consult for heparin Indication: atrial fibrillation  No Known Allergies  Patient Measurements: Height: 6\' 1"  (185.4 cm) Weight: 198 lb 4.8 oz (89.9 kg) IBW/kg (Calculated) : 79.9 Heparin Dosing Weight: 86.1 kg  Vital Signs: Temp: 98.5 F (36.9 C) (07/30 1130) Temp Source: Oral (07/30 1130) BP: 105/49 (07/30 1130) Pulse Rate: 61 (07/30 1130)  Labs:  Recent Labs  03/13/16 0520 03/14/16 0320 03/15/16 0300 03/15/16 1002  HGB 10.4* 10.4* 9.3*  --   HCT 31.9* 34.3* 28.9*  --   PLT 143* 137* 204  --   HEPARINUNFRC 0.57 0.42 0.24* 0.18*  CREATININE 1.46* 1.23  --  1.15    Estimated Creatinine Clearance: 56.9 mL/min (by C-G formula based on SCr of 1.15 mg/dL).   Medications:  Scheduled:  . [START ON 03/16/2016] acetaminophen  1,000 mg Oral On Call to OR  . aspirin EC  81 mg Oral Daily  . calcium-vitamin D  1 tablet Oral Q breakfast  . [START ON 03/16/2016]  ceFAZolin (ANCEF) IV  2 g Intravenous On Call to OR   And  . [START ON 03/16/2016] metronidazole  500 mg Intravenous On Call to OR  . Chlorhexidine Gluconate Cloth  6 each Topical Once   And  . Chlorhexidine Gluconate Cloth  6 each Topical Once  . donepezil  10 mg Oral QHS  . [START ON 03/16/2016] gabapentin  300 mg Oral On Call to OR  . memantine  10 mg Oral BID  . multivitamin  1 tablet Oral Daily  . multivitamin with minerals  1 tablet Oral Daily  . potassium chloride  40 mEq Oral Once  . QUEtiapine  50 mg Oral QHS  . tamsulosin  0.4 mg Oral QHS  . trimethoprim  100 mg Oral Daily  . vitamin C  1,000 mg Oral Daily   Infusions:  . sodium chloride 1,000 mL (03/14/16 2318)  . heparin 1,200 Units/hr (03/14/16 2318)    Assessment: 82 yo male with afib that is currently on heparin until laparoscopic cholecystectomy that is planned for Monday (7/31).   HL came back sub therapeutic at 0.24 this morning after inadvertently becoming disconnected from drip.   Repeat level resulted as 0.18. CBC stable. Heparin to be held/stopped at 0300 on 7/31 in anticipation of sx later that morning.  Goal of Therapy:  Heparin level 0.3-0.7 units/ml Monitor platelets by anticoagulation protocol: Yes   Plan:  1. Increase heparin to 1350 units/hr with plan stopped time of 0300 on 7/31 (orders have been entered) 2. Daily heparin level to resume on 8/1 3. F/u resuming heparin or other form of anticoagulation post op    Pollyann Samples, PharmD, BCPS 03/15/2016, 12:00 PM Pager: [phone]

## 2016-03-15 NOTE — Progress Notes (Signed)
  Subjective: Pt denies pain RN in room On hepatin gtts  Objective: Vital signs in last 24 hours: Temp:  [97.8 F (36.6 C)-99.1 F (37.3 C)] 97.8 F (36.6 C) (07/30 0602) Pulse Rate:  [63-88] 88 (07/30 0602) Resp:  [18] 18 (07/30 0602) BP: (108-129)/(48-66) 108/48 (07/30 0602) SpO2:  [93 %-100 %] 96 % (07/30 0602) Weight:  [89.9 kg (198 lb 4.8 oz)] 89.9 kg (198 lb 4.8 oz) (07/30 0602) Last BM Date: 03/10/16  Intake/Output from previous day: 07/29 0701 - 07/30 0700 In: 3554 [P.O.:540; I.V.:3014] Out: 2300 [Urine:2300] Intake/Output this shift: No intake/output data recorded.  General: Pt awake/alert in no major acute distress.  No well oriented Eyes: PERRL, normal EOM. Sclera nonicteric Neuro: CN II-XII intact w/o focal sensory/motor deficits. Lymph: No head/neck/groin lymphadenopathy Psych:  No delerium/psychosis/paranoia.  Mild dementia stable HENT: Normocephalic, Mucus membranes moist.  No thrush Neck: Supple, No tracheal deviation Chest: No pain.  Good respiratory excursion. CV:  Pulses intact.  irregular rhythm MS: Normal AROM mjr joints.  No obvious deformity Abdomen: Soft, Nondistended.  Nontender.  No incarcerated hernias. Ext:  SCDs BLE.  No significant edema.  No cyanosis Skin: No petechiae / purpura   Lab Results:   Recent Labs  03/14/16 0320 03/15/16 0300  WBC 10.9* 20.6*  HGB 10.4* 9.3*  HCT 34.3* 28.9*  PLT 137* 204   BMET  Recent Labs  03/13/16 0520 03/14/16 0320  NA 142 138  K 4.2 3.8  CL 110 109  CO2 24 21*  GLUCOSE 77 63*  BUN 24* 17  CREATININE 1.46* 1.23  CALCIUM 8.6* 8.5*   PT/INR No results for input(s): LABPROT, INR in the last 72 hours. ABG No results for input(s): PHART, HCO3 in the last 72 hours.  Invalid input(s): PCO2, PO2  Studies/Results: No results found.  Anti-infectives: Anti-infectives    Start     Dose/Rate Route Frequency Ordered Stop   03/11/16 2200  trimethoprim (TRIMPEX) tablet 100 mg     100 mg  Oral Daily 03/11/16 1953        Assessment/Plan: Biliary pancreatitis - recurrent  Principal Problem:   Pancreatitis Active Problems:   Dementia without behavioral disturbance   CKD (chronic kidney disease), stage III   Bradycardia   Depression   Paroxysmal atrial fibrillation (HCC)  Recurrent GS pancreatitis  Plan lap chole in AM - recheck lipase & LFTs in AM  hold heparin 0300 tonight 8/1 Sunday AM in anticipation of surgery 0900 by Dr Lindie Spruce - d/w pt's RN & OR front desk  Dementia stable   LOS: 3 days    Emelynn Rance C. 03/15/2016

## 2016-03-15 NOTE — Progress Notes (Signed)
ANTICOAGULATION CONSULT NOTE - Follow Up Consult  Pharmacy Consult for heparin Indication: atrial fibrillation  Labs:  Recent Labs  03/13/16 0520 03/14/16 0320 03/15/16 0300  HGB 10.4* 10.4*  --   HCT 31.9* 34.3*  --   PLT 143* 137*  --   HEPARINUNFRC 0.57 0.42 0.24*  CREATININE 1.46* 1.23  --     Assessment/Plan:  82yo male now subtherapeutic on heparin after several levels at goal though had been trending down; RN reports that heparin gtt had been disconnected for unknown amount of time. Will continue gtt at current rate for now and check additional level.   Vernard Gambles, PharmD, BCPS  03/15/2016,4:44 AM

## 2016-03-15 NOTE — Progress Notes (Signed)
PROGRESS NOTE    Cole Wilson  UEA:[id number] DOB: 05/24/1932 DOA: 03/11/2016 PCP: [name], MD   Brief Narrative:  HPI on 03/11/2016 by [name] Cole Wilson is a 82 y.o. male with medical history significant of pancreatitis, dementia, depression, CKD-III, former alcohol user, who presents with abdominal pain.  Per his wife, patient started having abdominal pain at 12:00 PM. It is located in the periumbilical area, constant, 5 out of 10, dull, nonradiating. It is aggravated by deep breath. Patient does not have chest pain, SOB or cough. It is associated with diaphoresis. He has nausea, but no vomiting or diarrhea. Patient denies cough, fever or chills.  Of note, patient had recurrent UTI, and was started with daily trimethoprim for prophylaxis by urologist, [name] since last week. Patient denies symptoms of UTI. At arrival, pt was noted to be pale and diaphoretic with heart rate 45-50's sinus brady in ED. Of note, pt stopped drinking alcohol after he had second episode of pancreatitis last year.  Assessment & Plan   Acute on chronic pancreatitis/gallstones -Lipase on admission 5724, currently lipase 25 -[place] Abd: Mild wall thickening with small gallstones, fatty liver with mild biliary ductal dilatation, likely related to underlying pancreatitis.  -CT abd/pelvis findings are consistent with pancreatitis, and hepatobiliary system has no masses or other significant abnormality on CT scan -Continue pain control, IVF, antiemetics  -Triglycerides 70 -Gastroenterology consulted and appreciated  -General surgery consulted for possible lap chole on Monday, 7/31 -Diet advanced to clear liquid today -Cardiology consulted for preop assessment, he is at increased risk due to multiple medical problems but is currently stable for surgery  Atrial fibrillation -CHADSVASC 2 (age) -Patient not on anticoagulation -Recently saw his cardiologist at [facility], [name]:  "Persistent atrial fibrillation (RAF-HCC) Was discovered during recent ER visit with sepsis and hypotension heart rate has been well controlled on no treatment" -Spoke with [name], he only saw the patient one time. Thought Afib was due to UTI. Did not feel patient was a candidate for [facility] due to dementia.  -TSH and free T4 within normal limits -Echocardiogram EF55-60%, grade 1 diastolic dysfunction -Continue heparin -Discussed other NOAC options with patient's wife, she feels that would be appropriate -Case management consulted for insurance coverage -Currently patient is in sinus rhythm  Bradycardia -TSH/FT4 within normal limits -Resolved -Continue tele monitoring  Dementia -Continue Namenda, donepezil  Chronic kidney disease, stage III -Stable, baseline creatinine 1.6-1.8 -Creatinine currently 1.15 -Continue to monitor BMP  Depression  -Continue Seroquel  Recurrent urinary tract infection -Patient currently on TMP prophylaxis -UA showed small amount leukocytes -Urine culture shows multiple species   Leukocytosis  -WBC elevated to 20.6- ?reactive -Continue to monitor CBC  Hypokalemia -Will replace and continue to monitor BMP  Code status -spoke with patient's wife regarding code status, she stated he is a [name]   DVT Prophylaxis  Heparin  Code Status: [name]  Family Communication: none at bedside, wife via phone  Disposition Plan: Admitted. Continue to treat pancreatitis. Surgery 7/31  Consultants Gastroenterology General surgery Cardiology  Procedures  Echocardiogram  Antibiotics   Anti-infectives    Start     Dose/Rate Route Frequency Ordered Stop   03/16/16 0600  ceFAZolin (ANCEF) IVPB 2g/100 mL premix     2 g 200 mL/hr over 30 Minutes Intravenous On call to O.R. 03/15/16 1118 03/17/16 0559   03/16/16 0600  metroNIDAZOLE (FLAGYL) IVPB 500 mg     500 mg 100 mL/hr over 60  Minutes Intravenous On call to O.R. 03/15/16 1118 03/17/16 0559   03/11/16 2200   trimethoprim (TRIMPEX) tablet 100 mg     100 mg Oral Daily 03/11/16 1953        Subjective:   Cole Wilson seen and examined today.  Patient has dementia. No complaints today. Denies nausea, vomiting, abdominal pain, chest pain, shortness of breath.   Objective:   Vitals:   03/14/16 1216 03/14/16 1954 03/15/16 0602 03/15/16 1130  BP: (!) 113/53 129/66 (!) 108/48 (!) 105/49  Pulse: 63 70 88 61  Resp: 18 18 18 18   Temp: 99.1 F (37.3 C) 98.9 F (37.2 C) 97.8 F (36.6 C) 98.5 F (36.9 C)  TempSrc: Oral Oral Oral Oral  SpO2: 100% 93% 96% 98%  Weight:   89.9 kg (198 lb 4.8 oz)   Height:        Intake/Output Summary (Last 24 hours) at 03/15/16 1156 Last data filed at 03/15/16 0604  Gross per 24 hour  Intake             3554 ml  Output             2300 ml  Net             1254 ml   Filed Weights   03/13/16 0557 03/14/16 0629 03/15/16 0602  Weight: 85 kg (187 lb 8 oz) 86.8 kg (191 lb 6.4 oz) 89.9 kg (198 lb 4.8 oz)    Exam  General: Well developed, well nourished, NAD, appears stated age  HEENT: NCAT,  mucous membranes moist.   Cardiovascular: S1 S2 auscultated, RRR, 2/6SEM  Respiratory: Clear to auscultation bilaterally with equal chest rise  Abdomen: Soft, Epigastric TTP, nondistended, + bowel sounds  Extremities: warm dry without cyanosis clubbing or edema  Neuro: AAOx2, nonfocal  Skin: Without rashes exudates or nodules  Psych: Normal affect and demeanor, has dementia   Data Reviewed: I have personally reviewed following labs and imaging studies  CBC:  Recent Labs Lab 03/11/16 1641 03/11/16 1659 03/12/16 0317 03/13/16 0520 03/14/16 0320 03/15/16 0300  WBC 9.5  --  9.3 12.1* 10.9* 20.6*  HGB 12.0* 12.9* 12.3* 10.4* 10.4* 9.3*  HCT 36.1* 38.0* 38.5* 31.9* 34.3* 28.9*  MCV 88.0  --  88.1 88.1 91.2 89.2  PLT 151  --  184 143* 137* 204   Basic Metabolic Panel:  Recent Labs Lab 03/11/16 1641 03/11/16 1659 03/12/16 0317 03/13/16 0520  03/14/16 0320 03/15/16 1002  NA 138 142 138 142 138 135  K 4.6 4.7 4.4 4.2 3.8 3.4*  CL 106 104 104 110 109 112*  CO2 23  --  25 24 21* 20*  GLUCOSE 149* 147* 160* 77 63* 150*  BUN 27* 32* 25* 24* 17 14  CREATININE 1.69* 1.60* 1.55* 1.46* 1.23 1.15  CALCIUM 9.4  --  9.0 8.6* 8.5* 7.8*   GFR: Estimated Creatinine Clearance: 56.9 mL/min (by C-G formula based on SCr of 1.15 mg/dL). Liver Function Tests:  Recent Labs Lab 03/11/16 1641 03/12/16 0317 03/13/16 0520 03/14/16 0320  AST 104* 120* 44* 38  ALT 45 75* 39 31  ALKPHOS 121 125 96 103  BILITOT 1.0 0.8 0.6 0.7  PROT 6.5 6.7 5.5* 6.1*  ALBUMIN 4.0 3.7 3.2* 3.3*    Recent Labs Lab 03/11/16 1641 03/12/16 0745 03/13/16 0520 03/13/16 1146 03/15/16 1002  LIPASE 5,724* 2,805* 436* 181* 25   No results for input(s): AMMONIA in the last 168 hours. Coagulation Profile:  Recent  Labs Lab 03/11/16 2140  INR 1.11   Cardiac Enzymes: No results for input(s): CKTOTAL, CKMB, CKMBINDEX, TROPONINI in the last 168 hours. BNP (last 3 results) No results for input(s): PROBNP in the last 8760 hours. HbA1C: No results for input(s): HGBA1C in the last 72 hours. CBG:  Recent Labs Lab 03/14/16 1106 03/14/16 1544 03/14/16 2223 03/15/16 0635 03/15/16 1112  GLUCAP 91 86 92 90 124*   Lipid Profile: No results for input(s): CHOL, HDL, LDLCALC, TRIG, CHOLHDL, LDLDIRECT in the last 72 hours. Thyroid Function Tests: No results for input(s): TSH, T4TOTAL, FREET4, T3FREE, THYROIDAB in the last 72 hours. Anemia Panel: No results for input(s): VITAMINB12, FOLATE, FERRITIN, TIBC, IRON, RETICCTPCT in the last 72 hours. Urine analysis:    Component Value Date/Time   COLORURINE YELLOW 03/11/2016 1831   APPEARANCEUR CLEAR 03/11/2016 1831   LABSPEC 1.018 03/11/2016 1831   PHURINE 7.0 03/11/2016 1831   GLUCOSEU NEGATIVE 03/11/2016 1831   HGBUR NEGATIVE 03/11/2016 1831   BILIRUBINUR NEGATIVE 03/11/2016 1831   KETONESUR NEGATIVE  03/11/2016 1831   PROTEINUR NEGATIVE 03/11/2016 1831   UROBILINOGEN 0.2 06/21/2015 1800   NITRITE NEGATIVE 03/11/2016 1831   LEUKOCYTESUR SMALL (A) 03/11/2016 1831   Sepsis Labs: @LABRCNTIP (procalcitonin:4,lacticidven:4)  ) Recent Results (from the past 240 hour(s))  Blood culture (routine x 2)     Status: None (Preliminary result)   Collection Time: 03/11/16  4:41 PM  Result Value Ref Range Status   Specimen Description BLOOD LEFT ANTECUBITAL  Final   Special Requests BOTTLES DRAWN AEROBIC AND ANAEROBIC 5CC  Final   Culture NO GROWTH 4 DAYS  Final   Report Status PENDING  Incomplete  Blood culture (routine x 2)     Status: None (Preliminary result)   Collection Time: 03/11/16  5:12 PM  Result Value Ref Range Status   Specimen Description BLOOD RIGHT ARM  Final   Special Requests IN PEDIATRIC BOTTLE 1CC  Final   Culture NO GROWTH 4 DAYS  Final   Report Status PENDING  Incomplete  Urine culture     Status: Abnormal   Collection Time: 03/11/16  6:32 PM  Result Value Ref Range Status   Specimen Description URINE, RANDOM  Final   Special Requests NONE  Final   Culture MULTIPLE SPECIES PRESENT, SUGGEST RECOLLECTION (A)  Final   Report Status 03/13/2016 FINAL  Final  MRSA PCR Screening     Status: None   Collection Time: 03/12/16  4:33 AM  Result Value Ref Range Status   MRSA by PCR NEGATIVE NEGATIVE Final    Comment:        The GeneXpert MRSA Assay (FDA approved for NASAL specimens only), is one component of a comprehensive MRSA colonization surveillance program. It is not intended to diagnose MRSA infection nor to guide or monitor treatment for MRSA infections.       Radiology Studies: No results found.   Scheduled Meds: . [START ON 03/16/2016] acetaminophen  1,000 mg Oral On Call to OR  . aspirin EC  81 mg Oral Daily  . calcium-vitamin D  1 tablet Oral Q breakfast  . [START ON 03/16/2016]  ceFAZolin (ANCEF) IV  2 g Intravenous On Call to OR   And  . [START ON  03/16/2016] metronidazole  500 mg Intravenous On Call to OR  . Chlorhexidine Gluconate Cloth  6 each Topical Once   And  . Chlorhexidine Gluconate Cloth  6 each Topical Once  . donepezil  10 mg Oral QHS  . [  START ON 03/16/2016] gabapentin  300 mg Oral On Call to OR  . memantine  10 mg Oral BID  . multivitamin  1 tablet Oral Daily  . multivitamin with minerals  1 tablet Oral Daily  . QUEtiapine  50 mg Oral QHS  . tamsulosin  0.4 mg Oral QHS  . trimethoprim  100 mg Oral Daily  . vitamin C  1,000 mg Oral Daily   Continuous Infusions: . sodium chloride 1,000 mL (03/14/16 2318)  . heparin 1,200 Units/hr (03/14/16 2318)     LOS: 3 days   Time Spent in minutes   30 minutes  Lakindra Wible D.O. on 03/15/2016 at 11:56 AM  Between 7am to 7pm - Pager - [phone]  After 7pm go to [email or web address] - password [email or web address]  And look for the night coverage person covering for me after hours  [facility] Office  [phone]

## 2016-03-16 ENCOUNTER — Inpatient Hospital Stay (HOSPITAL_COMMUNITY): Payer: Medicare Other | Admitting: Anesthesiology

## 2016-03-16 ENCOUNTER — Encounter (HOSPITAL_COMMUNITY): Payer: Self-pay | Admitting: Anesthesiology

## 2016-03-16 ENCOUNTER — Encounter (HOSPITAL_COMMUNITY)
Admission: EM | Disposition: A | Discharge status: Skilled Nursing Facility | Payer: Self-pay | Source: Home / Self Care | Attending: Internal Medicine

## 2016-03-16 HISTORY — PX: CHOLECYSTECTOMY: SHX55

## 2016-03-16 LAB — CULTURE, BLOOD (ROUTINE X 2)
Culture: NO GROWTH
Culture: NO GROWTH

## 2016-03-16 LAB — GLUCOSE, CAPILLARY
GLUCOSE-CAPILLARY: 111 mg/dL — AB (ref 65–99)
Glucose-Capillary: 187 mg/dL — ABNORMAL HIGH (ref 65–99)

## 2016-03-16 LAB — CBC
HEMATOCRIT: 28.6 % — AB (ref 39.0–52.0)
HEMOGLOBIN: 9 g/dL — AB (ref 13.0–17.0)
MCH: 28.2 pg (ref 26.0–34.0)
MCHC: 31.5 g/dL (ref 30.0–36.0)
MCV: 89.7 fL (ref 78.0–100.0)
Platelets: 126 10*3/uL — ABNORMAL LOW (ref 150–400)
RBC: 3.19 MIL/uL — ABNORMAL LOW (ref 4.22–5.81)
RDW: 13.9 % (ref 11.5–15.5)
WBC: 7.6 10*3/uL (ref 4.0–10.5)

## 2016-03-16 LAB — COMPREHENSIVE METABOLIC PANEL
ALBUMIN: 2.9 g/dL — AB (ref 3.5–5.0)
ALK PHOS: 83 U/L (ref 38–126)
ALT: 20 U/L (ref 17–63)
ANION GAP: 6 (ref 5–15)
AST: 26 U/L (ref 15–41)
BILIRUBIN TOTAL: 0.7 mg/dL (ref 0.3–1.2)
BUN: 12 mg/dL (ref 6–20)
CO2: 20 mmol/L — AB (ref 22–32)
Calcium: 8.3 mg/dL — ABNORMAL LOW (ref 8.9–10.3)
Chloride: 114 mmol/L — ABNORMAL HIGH (ref 101–111)
Creatinine, Ser: 1.06 mg/dL (ref 0.61–1.24)
GFR calc Af Amer: >60 mL/min (ref >60)
GFR calc non Af Amer: >60 mL/min (ref >60)
GLUCOSE: 102 mg/dL — AB (ref 65–99)
POTASSIUM: 3.7 mmol/L (ref 3.5–5.1)
SODIUM: 140 mmol/L (ref 135–145)
TOTAL PROTEIN: 5.5 g/dL — AB (ref 6.5–8.1)

## 2016-03-16 LAB — LIPASE, BLOOD: Lipase: 26 U/L (ref 11–51)

## 2016-03-16 SURGERY — LAPAROSCOPIC CHOLECYSTECTOMY WITH INTRAOPERATIVE CHOLANGIOGRAM
Anesthesia: General | Site: Abdomen

## 2016-03-16 MED ORDER — HEPARIN (PORCINE) IN NACL 100-0.45 UNIT/ML-% IJ SOLN
1350.0000 [IU]/h | INTRAMUSCULAR | Status: AC
  Administered 2016-03-16: 1350 [IU]/h via INTRAVENOUS
  Filled 2016-03-16: qty 250

## 2016-03-16 MED ORDER — BUPIVACAINE-EPINEPHRINE 0.25% -1:200000 IJ SOLN
INTRAMUSCULAR | Status: DC | PRN
  Administered 2016-03-16: 20 mL

## 2016-03-16 MED ORDER — ROCURONIUM BROMIDE 100 MG/10ML IV SOLN
INTRAVENOUS | Status: DC | PRN
  Administered 2016-03-16: 30 mg via INTRAVENOUS
  Administered 2016-03-16: 20 mg via INTRAVENOUS

## 2016-03-16 MED ORDER — PHENYLEPHRINE 40 MCG/ML (10ML) SYRINGE FOR IV PUSH (FOR BLOOD PRESSURE SUPPORT)
PREFILLED_SYRINGE | INTRAVENOUS | Status: AC
  Filled 2016-03-16: qty 10

## 2016-03-16 MED ORDER — FENTANYL CITRATE (PF) 100 MCG/2ML IJ SOLN
INTRAMUSCULAR | Status: DC | PRN
  Administered 2016-03-16: 50 ug via INTRAVENOUS
  Administered 2016-03-16 (×2): 100 ug via INTRAVENOUS

## 2016-03-16 MED ORDER — PHENYLEPHRINE HCL 10 MG/ML IJ SOLN
INTRAMUSCULAR | Status: DC | PRN
  Administered 2016-03-16 (×3): 80 ug via INTRAVENOUS

## 2016-03-16 MED ORDER — SODIUM CHLORIDE 0.9 % IV SOLN
INTRAVENOUS | Status: DC | PRN
  Administered 2016-03-16: 1 mL

## 2016-03-16 MED ORDER — SUGAMMADEX SODIUM 200 MG/2ML IV SOLN
INTRAVENOUS | Status: DC | PRN
  Administered 2016-03-16: 200 mg via INTRAVENOUS

## 2016-03-16 MED ORDER — EPHEDRINE 5 MG/ML INJ
INTRAVENOUS | Status: AC
  Filled 2016-03-16: qty 10

## 2016-03-16 MED ORDER — 0.9 % SODIUM CHLORIDE (POUR BTL) OPTIME
TOPICAL | Status: DC | PRN
  Administered 2016-03-16: 1000 mL

## 2016-03-16 MED ORDER — IOPAMIDOL (ISOVUE-300) INJECTION 61%
INTRAVENOUS | Status: AC
  Filled 2016-03-16: qty 50

## 2016-03-16 MED ORDER — GABAPENTIN 300 MG PO CAPS: 300.0000 mg | ORAL_CAPSULE | ORAL | Status: DC

## 2016-03-16 MED ORDER — ONDANSETRON HCL 4 MG/2ML IJ SOLN
INTRAMUSCULAR | Status: DC | PRN
  Administered 2016-03-16: 4 mg via INTRAVENOUS

## 2016-03-16 MED ORDER — LIDOCAINE 2% (20 MG/ML) 5 ML SYRINGE
INTRAMUSCULAR | Status: AC
  Filled 2016-03-16: qty 5

## 2016-03-16 MED ORDER — ONDANSETRON HCL 4 MG/2ML IJ SOLN: 4.0000 mg | Freq: Once | INTRAMUSCULAR | Status: DC | PRN

## 2016-03-16 MED ORDER — ONDANSETRON HCL 4 MG/2ML IJ SOLN
INTRAMUSCULAR | Status: AC
Start: 2016-03-16 — End: 2016-03-16
  Filled 2016-03-16: qty 2

## 2016-03-16 MED ORDER — HYDROMORPHONE HCL 1 MG/ML IJ SOLN: 0.5000 mg | INTRAMUSCULAR | Status: DC | PRN

## 2016-03-16 MED ORDER — SODIUM CHLORIDE 0.9 % IR SOLN
Status: DC | PRN
  Administered 2016-03-16: 1000 mL

## 2016-03-16 MED ORDER — PROPOFOL 10 MG/ML IV BOLUS
INTRAVENOUS | Status: DC | PRN
  Administered 2016-03-16: 100 mg via INTRAVENOUS

## 2016-03-16 MED ORDER — FENTANYL CITRATE (PF) 250 MCG/5ML IJ SOLN
INTRAMUSCULAR | Status: AC
  Filled 2016-03-16: qty 5

## 2016-03-16 MED ORDER — BUPIVACAINE-EPINEPHRINE (PF) 0.25% -1:200000 IJ SOLN
INTRAMUSCULAR | Status: AC
  Filled 2016-03-16: qty 30

## 2016-03-16 MED ORDER — PHENYLEPHRINE HCL 10 MG/ML IJ SOLN
INTRAVENOUS | Status: DC | PRN
  Administered 2016-03-16: 50 ug/min via INTRAVENOUS

## 2016-03-16 MED ORDER — ROCURONIUM BROMIDE 50 MG/5ML IV SOLN
INTRAVENOUS | Status: AC
  Filled 2016-03-16: qty 1

## 2016-03-16 MED ORDER — PROPOFOL 10 MG/ML IV BOLUS
INTRAVENOUS | Status: AC
  Filled 2016-03-16: qty 20

## 2016-03-16 MED ORDER — SUGAMMADEX SODIUM 200 MG/2ML IV SOLN
INTRAVENOUS | Status: AC
  Filled 2016-03-16: qty 2

## 2016-03-16 MED ORDER — LACTATED RINGERS IV SOLN
INTRAVENOUS | Status: DC
  Administered 2016-03-16 (×2): via INTRAVENOUS

## 2016-03-16 MED ORDER — ACETAMINOPHEN 500 MG PO TABS: 1000.0000 mg | ORAL_TABLET | ORAL | Status: DC

## 2016-03-16 MED ORDER — EPHEDRINE SULFATE 50 MG/ML IJ SOLN
INTRAMUSCULAR | Status: DC | PRN
  Administered 2016-03-16: 10 mg via INTRAVENOUS

## 2016-03-16 MED ORDER — MIDAZOLAM HCL 2 MG/2ML IJ SOLN
INTRAMUSCULAR | Status: AC
  Filled 2016-03-16: qty 2

## 2016-03-16 MED ORDER — METRONIDAZOLE IN NACL 5-0.79 MG/ML-% IV SOLN: 500.0000 mg | INTRAVENOUS | Status: DC

## 2016-03-16 MED ORDER — CEFAZOLIN SODIUM-DEXTROSE 2-4 GM/100ML-% IV SOLN
2.0000 g | INTRAVENOUS | Status: AC
  Administered 2016-03-16: 2 g via INTRAVENOUS
  Filled 2016-03-16: qty 100

## 2016-03-16 MED ORDER — LIDOCAINE HCL (CARDIAC) 20 MG/ML IV SOLN
INTRAVENOUS | Status: DC | PRN
  Administered 2016-03-16: 100 mg via INTRAVENOUS

## 2016-03-16 MED ORDER — SODIUM CHLORIDE 0.9 % IV SOLN
INTRAVENOUS | Status: DC
  Administered 2016-03-16 – 2016-03-17 (×2): via INTRAVENOUS

## 2016-03-16 MED ORDER — ALBUMIN HUMAN 5 % IV SOLN
INTRAVENOUS | Status: DC | PRN
  Administered 2016-03-16: 10:00:00 via INTRAVENOUS

## 2016-03-16 SURGICAL SUPPLY — 49 items
ADH SKN CLS APL DERMABOND .7 (GAUZE/BANDAGES/DRESSINGS) ×1
APPLIER CLIP 5 13 M/L LIGAMAX5 (MISCELLANEOUS) ×2
APR CLP MED LRG 5 ANG JAW (MISCELLANEOUS) ×1
BAG SPEC RTRVL 10 TROC 200 (ENDOMECHANICALS) ×1
BLADE SURG ROTATE 9660 (MISCELLANEOUS) ×1 IMPLANT
CANISTER SUCTION 2500CC (MISCELLANEOUS) ×2 IMPLANT
CHLORAPREP W/TINT 26ML (MISCELLANEOUS) ×2 IMPLANT
CLIP APPLIE 5 13 M/L LIGAMAX5 (MISCELLANEOUS) ×1 IMPLANT
COVER MAYO STAND STRL (DRAPES) ×1 IMPLANT
COVER SURGICAL LIGHT HANDLE (MISCELLANEOUS) ×2 IMPLANT
DERMABOND ADVANCED (GAUZE/BANDAGES/DRESSINGS) ×1
DERMABOND ADVANCED .7 DNX12 (GAUZE/BANDAGES/DRESSINGS) ×1 IMPLANT
DRAPE C-ARM 42X72 X-RAY (DRAPES) ×1 IMPLANT
DRSG TEGADERM 2-3/8X2-3/4 SM (GAUZE/BANDAGES/DRESSINGS) ×8 IMPLANT
ELECT REM PT RETURN 9FT ADLT (ELECTROSURGICAL) ×2
ELECTRODE REM PT RTRN 9FT ADLT (ELECTROSURGICAL) ×1 IMPLANT
GLOVE BIOGEL PI IND STRL 6 (GLOVE) IMPLANT
GLOVE BIOGEL PI IND STRL 6.5 (GLOVE) IMPLANT
GLOVE BIOGEL PI IND STRL 7.5 (GLOVE) IMPLANT
GLOVE BIOGEL PI IND STRL 8 (GLOVE) ×1 IMPLANT
GLOVE BIOGEL PI INDICATOR 6 (GLOVE) ×1
GLOVE BIOGEL PI INDICATOR 6.5 (GLOVE) ×2
GLOVE BIOGEL PI INDICATOR 7.5 (GLOVE) ×1
GLOVE BIOGEL PI INDICATOR 8 (GLOVE) ×1
GLOVE ECLIPSE 7.5 STRL STRAW (GLOVE) ×3 IMPLANT
GOWN STRL REUS W/ TWL LRG LVL3 (GOWN DISPOSABLE) ×3 IMPLANT
GOWN STRL REUS W/TWL LRG LVL3 (GOWN DISPOSABLE) ×6
KIT BASIN OR (CUSTOM PROCEDURE TRAY) ×2 IMPLANT
KIT ROOM TURNOVER OR (KITS) ×2 IMPLANT
L-HOOK LAP DISP 36CM (ELECTROSURGICAL) ×2
LHOOK LAP DISP 36CM (ELECTROSURGICAL) ×1 IMPLANT
NS IRRIG 1000ML POUR BTL (IV SOLUTION) ×2 IMPLANT
PAD ARMBOARD 7.5X6 YLW CONV (MISCELLANEOUS) ×2 IMPLANT
PENCIL BUTTON HOLSTER BLD 10FT (ELECTRODE) ×2 IMPLANT
POUCH RETRIEVAL ECOSAC 10 (ENDOMECHANICALS) IMPLANT
POUCH RETRIEVAL ECOSAC 10MM (ENDOMECHANICALS) ×1
SCISSORS LAP 5X35 DISP (ENDOMECHANICALS) ×2 IMPLANT
SET CHOLANGIOGRAPH 5 50 .035 (SET/KITS/TRAYS/PACK) ×1 IMPLANT
SET IRRIG TUBING LAPAROSCOPIC (IRRIGATION / IRRIGATOR) ×2 IMPLANT
SLEEVE ENDOPATH XCEL 5M (ENDOMECHANICALS) ×4 IMPLANT
SPECIMEN JAR SMALL (MISCELLANEOUS) ×2 IMPLANT
STRIP CLOSURE SKIN 1/2X4 (GAUZE/BANDAGES/DRESSINGS) ×2 IMPLANT
SUT MNCRL AB 4-0 PS2 18 (SUTURE) ×2 IMPLANT
TOWEL OR 17X24 6PK STRL BLUE (TOWEL DISPOSABLE) ×2 IMPLANT
TOWEL OR 17X26 10 PK STRL BLUE (TOWEL DISPOSABLE) ×1 IMPLANT
TRAY LAPAROSCOPIC MC (CUSTOM PROCEDURE TRAY) ×2 IMPLANT
TROCAR XCEL BLUNT TIP 100MML (ENDOMECHANICALS) ×2 IMPLANT
TROCAR XCEL NON-BLD 5MMX100MML (ENDOMECHANICALS) ×2 IMPLANT
TUBING INSUFFLATION (TUBING) ×2 IMPLANT

## 2016-03-16 NOTE — Progress Notes (Signed)
ANTICOAGULATION CONSULT NOTE - Follow Up Consult  Pharmacy Consult for heparin Indication: atrial fibrillation  No Known Allergies  Patient Measurements: Height: 6\' 1"  (185.4 cm) Weight: 193 lb 12.8 oz (87.9 kg) IBW/kg (Calculated) : 79.9 Heparin Dosing Weight: 86.1 kg  Vital Signs: Temp: 97 F (36.1 C) (07/31 1208) Temp Source: Oral (07/31 1208) BP: 111/53 (07/31 1208) Pulse Rate: 55 (07/31 1208)  Labs:  Recent Labs  03/14/16 0320 03/15/16 0300 03/15/16 1002 03/16/16 0319  HGB 10.4* 9.3*  --  9.0*  HCT 34.3* 28.9*  --  28.6*  PLT 137* 204  --  126*  HEPARINUNFRC 0.42 0.24* 0.18*  --   CREATININE 1.23  --  1.15 1.06    Estimated Creatinine Clearance: 61.8 mL/min (by C-G formula based on SCr of 1.06 mg/dL).   Medications:  Scheduled:  . aspirin EC  81 mg Oral Daily  . calcium-vitamin D  1 tablet Oral Q breakfast  . donepezil  10 mg Oral QHS  . memantine  10 mg Oral BID  . multivitamin  1 tablet Oral Daily  . multivitamin with minerals  1 tablet Oral Daily  . QUEtiapine  50 mg Oral QHS  . tamsulosin  0.4 mg Oral QHS  . trimethoprim  100 mg Oral Daily  . vitamin C  1,000 mg Oral Daily   Infusions:  . sodium chloride    . heparin      Assessment: 82 yo male with afib that was previously on heparin IV but this was held this morning pre-op. Heparin now to be resumed at 7pm tonight after his laparoscopic cholecystectomy. No bolus per surgery.   Goal of Therapy:  Heparin level 0.3-0.7 units/ml Monitor platelets by anticoagulation protocol: Yes   Plan:  - Resume heparin gtt 1350 units/hr tonight at 7pm - no bolus - Check an 8 hr heparin level - Daily heparin level and CBC  Lysle Pearl, PharmD, BCPS Pager # [phone] 03/16/2016 12:20 PM

## 2016-03-16 NOTE — Anesthesia Preprocedure Evaluation (Signed)
Anesthesia Evaluation  Patient identified by MRN, date of birth, ID band Patient awake and Patient confused    Reviewed: Allergy & Precautions, NPO status , Patient's Chart, lab work & pertinent test results, Unable to perform ROS - Chart review only  Airway Mallampati: I  TM Distance: >3 FB     Dental   Pulmonary    Pulmonary exam normal        Cardiovascular Normal cardiovascular exam     Neuro/Psych Anxiety Depression    GI/Hepatic   Endo/Other    Renal/GU Renal disease     Musculoskeletal   Abdominal   Peds  Hematology  (+) anemia ,   Anesthesia Other Findings Dementia Hx etoh  Reproductive/Obstetrics                             Anesthesia Physical Anesthesia Plan  ASA: III  Anesthesia Plan: General   Post-op Pain Management:    Induction: Intravenous  Airway Management Planned: Oral ETT  Additional Equipment:   Intra-op Plan:   Post-operative Plan: Extubation in OR  Informed Consent:   Plan Discussed with: CRNA, Anesthesiologist and Surgeon  Anesthesia Plan Comments:         Anesthesia Quick Evaluation

## 2016-03-16 NOTE — Progress Notes (Signed)
I talked with the patient who is having no abdominal pain.  He seems to understand what is planned.  LFTs are normal.  Hemoglobin is 9.0.  Lipase has normalized.  Heparin has been off since 3:00 AM.  Marta Lamas. Gae Bon, MD, FACS [phone] [facility] Surgery

## 2016-03-16 NOTE — Progress Notes (Signed)
Patient arrived from OR at thistime to room 3E16.

## 2016-03-16 NOTE — Progress Notes (Addendum)
PROGRESS NOTE    Cole Wilson  CMK:[id number] DOB: May 04, 1932 DOA: 03/11/2016 PCP: [name], MD   Brief Narrative:  HPI on 03/11/2016 by [name] Cole Wilson is a 82 y.o. male with medical history significant of pancreatitis, dementia, depression, CKD-III, former alcohol user, who presents with abdominal pain.  Per his wife, patient started having abdominal pain at 12:00 PM. It is located in the periumbilical area, constant, 5 out of 10, dull, nonradiating. It is aggravated by deep breath. Patient does not have chest pain, SOB or cough. It is associated with diaphoresis. He has nausea, but no vomiting or diarrhea. Patient denies cough, fever or chills.  Of note, patient had recurrent UTI, and was started with daily trimethoprim for prophylaxis by urologist, [name] since last week. Patient denies symptoms of UTI. At arrival, pt was noted to be pale and diaphoretic with heart rate 45-50's sinus brady in ED. Of note, pt stopped drinking alcohol after he had second episode of pancreatitis last year.  Assessment & Plan   Acute on chronic pancreatitis/gallstones -Lipase on admission 5724, currently lipase 26 -[place] Abd: Mild wall thickening with small gallstones, fatty liver with mild biliary ductal dilatation, likely related to underlying pancreatitis.  -CT abd/pelvis findings are consistent with pancreatitis, and hepatobiliary system has no masses or other significant abnormality on CT scan -Continue pain control, IVF, antiemetics  -Triglycerides 70 -Gastroenterology consulted and appreciated  -General surgery consulted for possible lap chole today Monday, 7/31 -Diet advanced to clear liquid today -Cardiology consulted for preop assessment, he is at increased risk due to multiple medical problems but is currently stable for surgery  Atrial fibrillation -CHADSVASC 2 (age) -Patient not on anticoagulation -Recently saw his cardiologist at [facility], [name]:  "Persistent atrial fibrillation (RAF-HCC) Was discovered during recent ER visit with sepsis and hypotension heart rate has been well controlled on no treatment" -Spoke with [name], he only saw the patient one time. Thought Afib was due to UTI. Did not feel patient was a candidate for [facility] due to dementia.  -TSH and free T4 within normal limits -Echocardiogram EF55-60%, grade 1 diastolic dysfunction -Continue heparin -Discussed other NOAC options with patient's wife, she feels that would be appropriate -Case management consulted for insurance coverage -Currently patient is in sinus rhythm  Bradycardia -TSH/FT4 within normal limits -Resolved -Continue tele monitoring  Dementia -Continue Namenda, donepezil  Chronic kidney disease, stage III -Stable, baseline creatinine 1.6-1.8 -Creatinine currently 1.06 -Continue to monitor BMP  Depression  -Continue Seroquel  Recurrent urinary tract infection -Patient currently on TMP prophylaxis -UA showed small amount leukocytes -Urine culture shows multiple species   Leukocytosis  -WBC elevated to 20.6- ?reactive -Today WBC 7.6 with no intervention -Continue to monitor CBC  Hypokalemia -Replaced, Potassium 3.7, will given additional dose of potassium  -Continue to monitor BMP  Code status -spoke with patient's wife regarding code status, she stated he is a [name]   DVT Prophylaxis  Heparin  Code Status: [name]  Family Communication: none at bedside, wife via phone  Disposition Plan: Admitted. Continue to treat pancreatitis. Surgery today 7/31  Consultants Gastroenterology General surgery Cardiology  Procedures  Echocardiogram  Antibiotics   Anti-infectives    Start     Dose/Rate Route Frequency Ordered Stop   03/16/16 0845  metroNIDAZOLE (FLAGYL) IVPB 500 mg     500 mg 100 mL/hr over 60 Minutes Intravenous On call to O.R. 03/16/16 0404 03/17/16 0759   03/16/16 0845  ceFAZolin (ANCEF)  IVPB 2g/100 mL premix     2 g 200  mL/hr over 30 Minutes Intravenous On call to O.R. 03/16/16 0404 03/17/16 0559   03/16/16 0600  ceFAZolin (ANCEF) IVPB 2g/100 mL premix  Status:  Discontinued     2 g 200 mL/hr over 30 Minutes Intravenous On call to O.R. 03/15/16 1118 03/16/16 0404   03/16/16 0600  metroNIDAZOLE (FLAGYL) IVPB 500 mg  Status:  Discontinued     500 mg 100 mL/hr over 60 Minutes Intravenous On call to O.R. 03/15/16 1118 03/16/16 0404   03/11/16 2200  trimethoprim (TRIMPEX) tablet 100 mg     100 mg Oral Daily 03/11/16 1953        Subjective:   Cole Wilson seen and examined today.  Patient has dementia. No complaints today. Denies nausea, vomiting, abdominal pain, chest pain, shortness of breath. States he is ready for his surgery and to "get straightened out".  Objective:   Vitals:   03/15/16 0602 03/15/16 1130 03/15/16 2156 03/16/16 0324  BP: (!) 108/48 (!) 105/49 (!) 149/67 (!) 158/63  Pulse: 88 61 71 65  Resp: 18 18 20 20   Temp: 97.8 F (36.6 C) 98.5 F (36.9 C) 99.2 F (37.3 C) 97.8 F (36.6 C)  TempSrc: Oral Oral Oral Oral  SpO2: 96% 98% 97% 95%  Weight: 89.9 kg (198 lb 4.8 oz)   87.9 kg (193 lb 12.8 oz)  Height:        Intake/Output Summary (Last 24 hours) at 03/16/16 0747 Last data filed at 03/16/16 0645  Gross per 24 hour  Intake          3731.68 ml  Output             3375 ml  Net           356.68 ml   Filed Weights   03/14/16 0629 03/15/16 0602 03/16/16 0324  Weight: 86.8 kg (191 lb 6.4 oz) 89.9 kg (198 lb 4.8 oz) 87.9 kg (193 lb 12.8 oz)    Exam  General: Well developed, well nourished, NAD, appears stated age  HEENT: NCAT,  mucous membranes moist.   Cardiovascular: S1 S2 auscultated, RRR, 2/6SEM  Respiratory: Clear to auscultation bilaterally with equal chest rise  Abdomen: Soft, Epigastric TTP, nondistended, + bowel sounds  Extremities: warm dry without cyanosis clubbing or edema  Neuro: AAOx1, nonfocal  Skin: Without rashes exudates or nodules  Psych:  Normal affect and demeanor, has dementia   Data Reviewed: I have personally reviewed following labs and imaging studies  CBC:  Recent Labs Lab 03/12/16 0317 03/13/16 0520 03/14/16 0320 03/15/16 0300 03/16/16 0319  WBC 9.3 12.1* 10.9* 20.6* 7.6  HGB 12.3* 10.4* 10.4* 9.3* 9.0*  HCT 38.5* 31.9* 34.3* 28.9* 28.6*  MCV 88.1 88.1 91.2 89.2 89.7  PLT 184 143* 137* 204 126*   Basic Metabolic Panel:  Recent Labs Lab 03/12/16 0317 03/13/16 0520 03/14/16 0320 03/15/16 1002 03/16/16 0319  NA 138 142 138 135 140  K 4.4 4.2 3.8 3.4* 3.7  CL 104 110 109 112* 114*  CO2 25 24 21* 20* 20*  GLUCOSE 160* 77 63* 150* 102*  BUN 25* 24* 17 14 12   CREATININE 1.55* 1.46* 1.23 1.15 1.06  CALCIUM 9.0 8.6* 8.5* 7.8* 8.3*   GFR: Estimated Creatinine Clearance: 61.8 mL/min (by C-G formula based on SCr of 1.06 mg/dL). Liver Function Tests:  Recent Labs Lab 03/11/16 1641 03/12/16 0317 03/13/16 0520 03/14/16 0320 03/16/16 0319  AST 104* 120* 44*  38 26  ALT 45 75* 39 31 20  ALKPHOS 121 125 96 103 83  BILITOT 1.0 0.8 0.6 0.7 0.7  PROT 6.5 6.7 5.5* 6.1* 5.5*  ALBUMIN 4.0 3.7 3.2* 3.3* 2.9*    Recent Labs Lab 03/12/16 0745 03/13/16 0520 03/13/16 1146 03/15/16 1002 03/16/16 0319  LIPASE 2,805* 436* 181* 25 26   No results for input(s): AMMONIA in the last 168 hours. Coagulation Profile:  Recent Labs Lab 03/11/16 2140  INR 1.11   Cardiac Enzymes: No results for input(s): CKTOTAL, CKMB, CKMBINDEX, TROPONINI in the last 168 hours. BNP (last 3 results) No results for input(s): PROBNP in the last 8760 hours. HbA1C: No results for input(s): HGBA1C in the last 72 hours. CBG:  Recent Labs Lab 03/14/16 2223 03/15/16 0635 03/15/16 1112 03/15/16 1558 03/15/16 2154  GLUCAP 92 90 124* 105* 105*   Lipid Profile: No results for input(s): CHOL, HDL, LDLCALC, TRIG, CHOLHDL, LDLDIRECT in the last 72 hours. Thyroid Function Tests: No results for input(s): TSH, T4TOTAL, FREET4,  T3FREE, THYROIDAB in the last 72 hours. Anemia Panel: No results for input(s): VITAMINB12, FOLATE, FERRITIN, TIBC, IRON, RETICCTPCT in the last 72 hours. Urine analysis:    Component Value Date/Time   COLORURINE YELLOW 03/11/2016 1831   APPEARANCEUR CLEAR 03/11/2016 1831   LABSPEC 1.018 03/11/2016 1831   PHURINE 7.0 03/11/2016 1831   GLUCOSEU NEGATIVE 03/11/2016 1831   HGBUR NEGATIVE 03/11/2016 1831   BILIRUBINUR NEGATIVE 03/11/2016 1831   KETONESUR NEGATIVE 03/11/2016 1831   PROTEINUR NEGATIVE 03/11/2016 1831   UROBILINOGEN 0.2 06/21/2015 1800   NITRITE NEGATIVE 03/11/2016 1831   LEUKOCYTESUR SMALL (A) 03/11/2016 1831   Sepsis Labs: @LABRCNTIP (procalcitonin:4,lacticidven:4)  ) Recent Results (from the past 240 hour(s))  Blood culture (routine x 2)     Status: None (Preliminary result)   Collection Time: 03/11/16  4:41 PM  Result Value Ref Range Status   Specimen Description BLOOD LEFT ANTECUBITAL  Final   Special Requests BOTTLES DRAWN AEROBIC AND ANAEROBIC 5CC  Final   Culture NO GROWTH 4 DAYS  Final   Report Status PENDING  Incomplete  Blood culture (routine x 2)     Status: None (Preliminary result)   Collection Time: 03/11/16  5:12 PM  Result Value Ref Range Status   Specimen Description BLOOD RIGHT ARM  Final   Special Requests IN PEDIATRIC BOTTLE 1CC  Final   Culture NO GROWTH 4 DAYS  Final   Report Status PENDING  Incomplete  Urine culture     Status: Abnormal   Collection Time: 03/11/16  6:32 PM  Result Value Ref Range Status   Specimen Description URINE, RANDOM  Final   Special Requests NONE  Final   Culture MULTIPLE SPECIES PRESENT, SUGGEST RECOLLECTION (A)  Final   Report Status 03/13/2016 FINAL  Final  MRSA PCR Screening     Status: None   Collection Time: 03/12/16  4:33 AM  Result Value Ref Range Status   MRSA by PCR NEGATIVE NEGATIVE Final    Comment:        The GeneXpert MRSA Assay (FDA approved for NASAL specimens only), is one component of  a comprehensive MRSA colonization surveillance program. It is not intended to diagnose MRSA infection nor to guide or monitor treatment for MRSA infections.       Radiology Studies: No results found.   Scheduled Meds: . acetaminophen  1,000 mg Oral On Call to OR  . aspirin EC  81 mg Oral Daily  . calcium-vitamin D  1 tablet Oral Q breakfast  . metronidazole  500 mg Intravenous On Call to OR   And  .  ceFAZolin (ANCEF) IV  2 g Intravenous On Call to OR  . donepezil  10 mg Oral QHS  . gabapentin  300 mg Oral On Call to OR  . memantine  10 mg Oral BID  . multivitamin  1 tablet Oral Daily  . multivitamin with minerals  1 tablet Oral Daily  . QUEtiapine  50 mg Oral QHS  . tamsulosin  0.4 mg Oral QHS  . trimethoprim  100 mg Oral Daily  . vitamin C  1,000 mg Oral Daily   Continuous Infusions: . sodium chloride 125 mL/hr at 03/16/16 0133     LOS: 4 days   Time Spent in minutes   30 minutes  Dayra Rapley D.O. on 03/16/2016 at 7:47 AM  Between 7am to 7pm - Pager - [phone]  After 7pm go to [email or web address] - password [email or web address]  And look for the night coverage person covering for me after hours  [facility] Office  [phone]

## 2016-03-16 NOTE — Progress Notes (Signed)
PT Cancellation Note  Patient Details Name: Cole Wilson MRN: [id number] DOB: May 13, 1932   Cancelled Treatment:    Reason Eval/Treat Not Completed: Patient at procedure or test/unavailable. Pt went to the OR this morning, therefore, PT cancelled for today. PT will continue to f/u with pt as appropriate. Please specify activity level following surgery.   Alessandra Bevels Adrieana Fennelly 03/16/2016, 11:08 AM Deborah Chalk, PT, DPT [phone]

## 2016-03-16 NOTE — Transfer of Care (Signed)
Immediate Anesthesia Transfer of Care Note  Patient: Cole Wilson  Procedure(s) Performed: Procedure(s): LAPAROSCOPIC CHOLECYSTECTOMY WITH ATTEMPTED INTRAOPERATIVE CHOLANGIOGRAM (N/A)  Patient Location: PACU  Anesthesia Type:General  Level of Consciousness: awake, alert , patient cooperative and responds to stimulation  Airway & Oxygen Therapy: Patient Spontanous Breathing and Patient connected to nasal cannula oxygen  Post-op Assessment: Report given to RN, Post -op Vital signs reviewed and stable and Patient moving all extremities  Post vital signs: Reviewed and stable  Last Vitals:  Vitals:   03/15/16 2156 03/16/16 0324  BP: (!) 149/67 (!) 158/63  Pulse: 71 65  Resp: 20 20  Temp: 37.3 C 36.6 C    Last Pain:  Vitals:   03/16/16 0910  TempSrc:   PainSc: 8       Patients Stated Pain Goal: 0 (03/12/16 2215)  Complications: No apparent anesthesia complications

## 2016-03-16 NOTE — Anesthesia Procedure Notes (Signed)
Procedure Name: Intubation Date/Time: 03/16/2016 9:43 AM Performed by: Rebekah Chesterfield L Pre-anesthesia Checklist: Patient identified, Emergency Drugs available, Suction available and Patient being monitored Patient Re-evaluated:Patient Re-evaluated prior to inductionOxygen Delivery Method: Circle System Utilized Preoxygenation: Pre-oxygenation with 100% oxygen Intubation Type: IV induction Ventilation: Mask ventilation without difficulty Tube type: Oral Tube size: 8.0 mm Number of attempts: 1 Airway Equipment and Method: Stylet and LTA kit utilized Placement Confirmation: ETT inserted through vocal cords under direct vision,  positive ETCO2 and breath sounds checked- equal and bilateral Secured at: 21 cm Tube secured with: Tape Dental Injury: Teeth and Oropharynx as per pre-operative assessment

## 2016-03-16 NOTE — Progress Notes (Signed)
Patient is transported via bed to OR at this time. Alert and in stable condition.

## 2016-03-16 NOTE — Op Note (Signed)
OPERATIVE REPORT  DATE OF OPERATION:03/16/2016  PATIENT:  Cole Wilson  82 y.o. male  PRE-OPERATIVE DIAGNOSIS:  Gallstones  POST-OPERATIVE DIAGNOSIS:  Gallstones  FINDINGS:  Acutely inflamed gallbladder at the infundibulum, Mirizzi's syndrome  PROCEDURE:  Procedure(s): LAPAROSCOPIC CHOLECYSTECTOMY   SURGEON:  Surgeon(s): Jimmye Norman, MD  ASSISTANT: None  ANESTHESIA:   general  COMPLICATIONS:  None  EBL: 50 ml  BLOOD ADMINISTERED: none  DRAINS: none   SPECIMEN:  Source of Specimen:  Gallbladder and contents  COUNTS CORRECT:  YES  PROCEDURE DETAILS: The patient was taken to the operating room and placed on the table in the supine position.  After an adequate endotracheal anesthetic was administered, the patient was prepped with ChloroPrep, and then draped in the usual manner exposing the entire abdomen laterally, inferiorly and up  to the costal margins.  After a proper timeout was performed including identifying the patient and the procedure to be performed, a supraumbilical 1.5cm midline incision was made using a #15 blade.  This was taken down to the fascia which was then incised with a #15 blade.  The edges of the fascia were tented up with Kocher clamps as the preperitoneal space was penetrated with a Kelly clamp into the peritoneum.  Once this was done, a pursestring suture of 0 Vicryl was passed around the fascial opening.  This was subsequently used to secure the [facility] cannula which was passed into the peritoneal cavity.  Once the [facility] cannula was in place, carbon dioxide gas was insufflated into the peritoneal cavity up to a maximal intra-abdominal pressure of 15mm Hg.The laparoscope, with attached camera and light source, was passed into the peritoneal cavity to visualize the direct insertion of two right upper quadrant 5mm cannulas, and a sup-xiphoid 5mm cannula.  Once all cannulas were in place, the dissection was begun.  Two ratcheted graspers were attached  to the dome and infundibulum of the gallbladder and retracted towards the anterior abdominal wall and the right upper quadrant.  Using cautery attached to a dissecting forceps, the peritoneum overlaying the triangle of Chalot and the hepatoduodenal triangle was dissected away exposing the cystic duct and the cystic artery.  A critical window was developed between the CBD and the cystic duct The cystic artery was clipped proximally and distally then transected.  A clip was placed on the gallbladder side of the cystic duct, then an attempted cholecystodochotomy was performed, but the cut went through the intire cystic duct.  It was felt to be too risky to attempt cholangiogram, so the distal duct was clipped x 3 and the inflammatory rind around th cystic duct  Completely cut and released  The gallbladder was then dissected out of the hepatic bed without event.  It was retrieved from the abdomen using a retrieval bag.  Once the gallbladder was removed, the bed was inspected for hemostasis.  Once excellent hemostasis was obtained all gas and fluids were aspirated from above the liver, then the cannulas were removed.  The supraumbilical incision was closed using the pursestring suture which was in place.  0.25% bupivicaine with epinephrine was injected at all sites.  All 10mm or greater cannula sites were close using a running subcuticular stitch of 4-0 Monocryl.  5.43mm cannula sites were closed with Dermabond only.Steri-Strips and Tagaderm were used to complete the dressings at all sites.  At this point all needle, sponge, and instrument counts were correct.The patient was awakened from anesthesia and taken to the PACU in stable condition.  PATIENT DISPOSITION:  PACU - hemodynamically stable.   Perina Salvaggio 7/31/201711:05 AM

## 2016-03-16 NOTE — Anesthesia Postprocedure Evaluation (Signed)
Anesthesia Post Note  Patient: Cole Wilson  Procedure(s) Performed: Procedure(s) (LRB): LAPAROSCOPIC CHOLECYSTECTOMY WITH ATTEMPTED INTRAOPERATIVE CHOLANGIOGRAM (N/A)  Patient location during evaluation: PACU Anesthesia Type: General Level of consciousness: awake, oriented and sedated Pain management: pain level controlled Vital Signs Assessment: post-procedure vital signs reviewed and stable Respiratory status: spontaneous breathing and respiratory function stable Cardiovascular status: blood pressure returned to baseline and stable Anesthetic complications: no    Last Vitals:  Vitals:   03/16/16 1136 03/16/16 1145  BP:    Pulse: 81   Resp: 14   Temp:  (!) 36.1 C    Last Pain:  Vitals:   03/16/16 1135  TempSrc:   PainSc: 0-No pain                 [name]

## 2016-03-17 ENCOUNTER — Encounter (HOSPITAL_COMMUNITY): Payer: Self-pay | Admitting: General Surgery

## 2016-03-17 LAB — FOLATE: Folate: 28.9 ng/mL (ref >5.9)

## 2016-03-17 LAB — VITAMIN B12: VITAMIN B 12: 704 pg/mL (ref 180–914)

## 2016-03-17 LAB — CBC
HCT: 25.4 % — ABNORMAL LOW (ref 39.0–52.0)
HEMOGLOBIN: 8 g/dL — AB (ref 13.0–17.0)
MCH: 27.7 pg (ref 26.0–34.0)
MCHC: 31.5 g/dL (ref 30.0–36.0)
MCV: 87.9 fL (ref 78.0–100.0)
PLATELETS: 141 10*3/uL — AB (ref 150–400)
RBC: 2.89 MIL/uL — AB (ref 4.22–5.81)
RDW: 13.8 % (ref 11.5–15.5)
WBC: 5.5 10*3/uL (ref 4.0–10.5)

## 2016-03-17 LAB — RETICULOCYTES
RBC.: 3.03 MIL/uL — ABNORMAL LOW (ref 4.22–5.81)
RETIC CT PCT: 0.9 % (ref 0.4–3.1)
Retic Count, Absolute: 27.3 10*3/uL (ref 19.0–186.0)

## 2016-03-17 LAB — IRON AND TIBC
IRON: 14 ug/dL — AB (ref 45–182)
SATURATION RATIOS: 9 % — AB (ref 17.9–39.5)
TIBC: 153 ug/dL — AB (ref 250–450)
UIBC: 139 ug/dL

## 2016-03-17 LAB — HEPARIN LEVEL (UNFRACTIONATED)
HEPARIN UNFRACTIONATED: 0.28 [IU]/mL — AB (ref 0.30–0.70)
HEPARIN UNFRACTIONATED: 0.37 [IU]/mL (ref 0.30–0.70)

## 2016-03-17 LAB — FERRITIN: Ferritin: 320 ng/mL (ref 24–336)

## 2016-03-17 MED ORDER — APIXABAN 5 MG PO TABS
5.0000 mg | ORAL_TABLET | Freq: Two times a day (BID) | ORAL | Status: DC
  Administered 2016-03-17 – 2016-03-18 (×3): 5 mg via ORAL
  Filled 2016-03-17 (×3): qty 1

## 2016-03-17 NOTE — NC FL2 (Signed)
[place] MEDICAID FL2 LEVEL OF CARE SCREENING TOOL     IDENTIFICATION  Patient Name: Cole Wilson Birthdate: 12/04/1931 Sex: male Admission Date (Current Location): 03/11/2016  [facility] and [place] Number:  Producer, television/film/video and Address:  [facility] [place], [address] [place], [place] [address]      Provider Number: [id number]  Attending Physician Name and Address:  Edsel Petrin, DO  Relative Name and Phone Number:       Current Level of Care: Hospital Recommended Level of Care: Skilled Nursing Facility Prior Approval Number:    Date Approved/Denied:   PASRR Number: [id number]  Discharge Plan: SNF    Current Diagnoses: Patient Active Problem List   Diagnosis Date Noted  . Paroxysmal atrial fibrillation (HCC) 03/13/2016  . Pancreatitis 03/11/2016  . Bradycardia 03/11/2016  . Depression 03/11/2016  . Pressure ulcer 11/19/2015  . Sepsis due to urinary tract infection (HCC) 11/18/2015  . Hypotension 11/18/2015  . Anemia 11/18/2015  . Acute encephalopathy 11/18/2015  . CKD (chronic kidney disease), stage III 11/18/2015  . Dementia without behavioral disturbance 06/22/2015  . Acute pancreatitis 06/21/2015  . Sepsis (HCC) 06/21/2015  . Transaminitis 06/21/2015  . Alcohol use (HCC) 06/21/2015  . Leucocytosis 03/20/2012    Orientation RESPIRATION BLADDER Height & Weight     Self  Normal Incontinent, External catheter Weight: 203 lb 4.8 oz (92.2 kg) (bedscale) Height:  6\' 1"  (185.4 cm)  BEHAVIORAL SYMPTOMS/MOOD NEUROLOGICAL BOWEL NUTRITION STATUS   (None)  (Dementia without behavioral disturbance) Continent Diet (Heart healthy)  AMBULATORY STATUS COMMUNICATION OF NEEDS Skin   Limited Assist Verbally Surgical wounds                       Personal Care Assistance Level of Assistance  Bathing, Feeding, Dressing Bathing Assistance: Limited assistance Feeding assistance: Independent Dressing Assistance: Maximum assistance    Functional Limitations Info  Sight, Hearing, Speech Sight Info: Adequate Hearing Info: Adequate Speech Info: Adequate    SPECIAL CARE FACTORS FREQUENCY  Blood pressure, PT (By licensed PT), OT (By licensed OT)     PT Frequency: 5 x week OT Frequency: 5 x week            Contractures Contractures Info: Not present    Additional Factors Info  Code Status, Allergies, Psychotropic Code Status Info: [name] Allergies Info: NKDA Psychotropic Info: Depression: Seroquel 50 mg PO QHS         Current Medications (03/17/2016):  This is the current hospital active medication list Current Facility-Administered Medications  Medication Dose Route Frequency Provider Last Rate Last Dose  . 0.9 %  sodium chloride infusion   Intravenous Continuous Jimmye Norman, MD 100 mL/hr at 03/16/16 1330    . apixaban (ELIQUIS) tablet 5 mg  5 mg Oral BID Faye Ramsay Rumbarger, RPH   5 mg at 03/17/16 1216  . aspirin EC tablet 81 mg  81 mg Oral Daily Lorretta Harp, MD   81 mg at 03/17/16 0919  . calcium-vitamin D (OSCAL WITH D) 500-200 MG-UNIT per tablet 1 tablet  1 tablet Oral Q breakfast Maryann Mikhail, DO   1 tablet at 03/17/16 0919  . donepezil (ARICEPT) tablet 10 mg  10 mg Oral QHS Lorretta Harp, MD   10 mg at 03/16/16 2313  . memantine (NAMENDA) tablet 10 mg  10 mg Oral BID Lorretta Harp, MD   10 mg at 03/17/16 0919  . morphine 2 MG/ML injection 2 mg  2  mg Intravenous Q4H PRN Lorretta Harp, MD   2 mg at 03/12/16 0245  . multivitamin (PROSIGHT) tablet 1 tablet  1 tablet Oral Daily Lorretta Harp, MD   1 tablet at 03/17/16 0919  . multivitamin with minerals tablet 1 tablet  1 tablet Oral Daily Lorretta Harp, MD   1 tablet at 03/17/16 0919  . ondansetron (ZOFRAN) injection 4 mg  4 mg Intravenous Q8H PRN Lorretta Harp, MD   4 mg at 03/12/16 2220  . oxyCODONE (Oxy IR/ROXICODONE) immediate release tablet 10 mg  10 mg Oral Q4H PRN Lorretta Harp, MD   10 mg at 03/16/16 1511  . QUEtiapine (SEROQUEL) tablet 50 mg  50 mg Oral QHS Lorretta Harp, MD   50 mg  at 03/16/16 2314  . tamsulosin (FLOMAX) capsule 0.4 mg  0.4 mg Oral QHS Lorretta Harp, MD   0.4 mg at 03/16/16 2314  . trimethoprim (TRIMPEX) tablet 100 mg  100 mg Oral Daily Lorretta Harp, MD   100 mg at 03/17/16 0919  . vitamin C (ASCORBIC ACID) tablet 1,000 mg  1,000 mg Oral Daily Lorretta Harp, MD   1,000 mg at 03/17/16 1610     Discharge Medications: Please see discharge summary for a list of discharge medications.  Relevant Imaging Results:  Relevant Lab Results:   Additional Information SS#: [id number]  Margarito Liner, LCSW

## 2016-03-17 NOTE — Discharge Instructions (Signed)

## 2016-03-17 NOTE — Progress Notes (Signed)
ANTICOAGULATION CONSULT NOTE - Follow Up Consult  Pharmacy Consult for heparin Indication: atrial fibrillation  Labs:  Recent Labs  03/15/16 0300 03/15/16 1002 03/16/16 0319 03/17/16 0254  HGB 9.3*  --  9.0* 8.0*  HCT 28.9*  --  28.6* 25.4*  PLT 204  --  126* 141*  HEPARINUNFRC 0.24* 0.18*  --  0.28*  CREATININE  --  1.15 1.06  --     Assessment/Plan:  82yo male slightly subtherapeutic on heparin though RN reports that pt had pulled IV line for unknown amount of time but was replaced ~MN. Will continue gtt at current rate for now and check additional level.   Vernard Gambles, PharmD, BCPS  03/17/2016,4:01 AM

## 2016-03-17 NOTE — Progress Notes (Addendum)
ANTICOAGULATION CONSULT NOTE - Follow Up Consult  Pharmacy Consult for heparin Indication: atrial fibrillation  No Known Allergies  Patient Measurements: Height: 6\' 1"  (185.4 cm) Weight: 203 lb 4.8 oz (92.2 kg) (bedscale) IBW/kg (Calculated) : 79.9 Heparin Dosing Weight: 86.1 kg  Vital Signs: Temp: 98.3 F (36.8 C) (08/01 0637) Temp Source: Oral (08/01 0637) BP: 152/69 (08/01 4356) Pulse Rate: 77 (08/01 0637)  Labs:  Recent Labs  03/15/16 0300 03/15/16 1002 03/16/16 0319 03/17/16 0254 03/17/16 0838  HGB 9.3*  --  9.0* 8.0*  --   HCT 28.9*  --  28.6* 25.4*  --   PLT 204  --  126* 141*  --   HEPARINUNFRC 0.24* 0.18*  --  0.28* 0.37  CREATININE  --  1.15 1.06  --   --     Estimated Creatinine Clearance: 61.8 mL/min (by C-G formula based on SCr of 1.06 mg/dL).   Medications:  Scheduled:  . aspirin EC  81 mg Oral Daily  . calcium-vitamin D  1 tablet Oral Q breakfast  . donepezil  10 mg Oral QHS  . memantine  10 mg Oral BID  . multivitamin  1 tablet Oral Daily  . multivitamin with minerals  1 tablet Oral Daily  . QUEtiapine  50 mg Oral QHS  . tamsulosin  0.4 mg Oral QHS  . trimethoprim  100 mg Oral Daily  . vitamin C  1,000 mg Oral Daily   Infusions:  . sodium chloride 100 mL/hr at 03/16/16 1330  . heparin 1,350 Units/hr (03/17/16 0615)    Assessment: 82 yo male with afib continues on IV heparin. Heparin level is now therapeutic after being resumed post-op yesterday. No bleeding noted.  Goal of Therapy:  Heparin level 0.3-0.7 units/ml Monitor platelets by anticoagulation protocol: Yes   Plan:  - Continue heparin gtt 1350 units/hr - Daily heparin level and CBC - F/u plan for oral anticoagulation  Lysle Pearl, PharmD, BCPS Pager # [phone] 03/17/2016 10:09 AM  Addendum: Transitioning to apixaban. DC heparin gtt and start apixaban 5mg  PO BID.  Lysle Pearl, PharmD, BCPS Pager # [phone] 03/17/2016 10:42 AM

## 2016-03-17 NOTE — Clinical Social Work Note (Signed)
Clinical Social Work Assessment  Patient Details  Name: Cole Wilson MRN: [id number] Date of Birth: [birth year]  Date of referral:  03/17/16               Reason for consult:  Discharge Planning                Permission sought to share information with:  Facility Sport and exercise psychologist, Family Supports Permission granted to share information::  Yes, Verbal Permission Granted  Name::     Cole Wilson  Agency::  [employer] [facility] [employer]  Relationship::  Wife  Contact Information:  [phone]  Housing/Transportation Living arrangements for the past 2 months:  [place] of Information:  Patient, Medical Team, Spouse Patient Interpreter Needed:  None Criminal Activity/Legal Involvement Pertinent to Current Situation/Hospitalization:  No - Comment as needed Significant Relationships:  Spouse Lives with:  Facility Resident Do you feel safe going back to the place where you live?  Yes Need for family participation in patient care:  Yes (Comment)  Care giving concerns:  Patient is from [facility]. PT recommending SNF. Patient can get SNF services at [facility] as well.   Social Worker assessment / plan:  CSW met with patient. Wife at bedside. Patient not fully oriented but took part in the conversation. CSW introduced role and explained that PT recommendations would be discussed. Patient's wife confirmed that he is from [facility] and they are agreeable to SNF at that facility. They have already been in contact with [facility] at SNF about it. Patient will need PTAR. No further concerns. CSW encouraged patient and his wife to contact CSW as needed. CSW will continue to follow patient and his wife for support and facilitate discharge back to [facility] for SNF services once medically stable.  Employment status:  Retired Nurse, adult PT Recommendations:  [place] / Referral to community resources:  Other  (Comment Required) (Plan to return to [facility] for SNF)  Patient/Family's Response to care:  Patient not fully oriented but agreeable to return to [facility]. Patient's wife agreeable as well. Patient's wife supportive and involved in patient's care. Patient and his wife appreciated social work intervention.  Patient/Family's Understanding of and Emotional Response to Diagnosis, Current Treatment, and Prognosis:  Patient not fully oriented. Patient's wife understands need for rehab at this time. Patient and his wife appear happy with hospital care.  Emotional Assessment Appearance:  Appears stated age Attitude/Demeanor/Rapport:  Other (Pleasant) Affect (typically observed):  Accepting, Appropriate, Calm, Pleasant Orientation:  Oriented to Self Alcohol / Substance use:  Alcohol Use Psych involvement (Current and /or in the community):  No (Comment)  Discharge Needs  Concerns to be addressed:  Care Coordination Readmission within the last 30 days:  No Current discharge risk:  Cognitively Impaired, Dependent with Mobility Barriers to Discharge:  No Barriers Identified   Candie Chroman, LCSW 03/17/2016, 2:35 PM

## 2016-03-17 NOTE — Progress Notes (Signed)
Physical Therapy Treatment Patient Details Name: Cole Wilson MRN: [id number] DOB: 11-28-1931 Today's Date: 03/17/2016    History of Present Illness 82 y.o. male with h/o dementia, chronic renal disease, EtOH, depression admitted from home with pancreatitis, bradycardia. Pt is s/p lap-chole (03/16/16) due to gallstones.    PT Comments    Pt s/p lap- chole (03/16/16) and goals still appropriate, but did update d/c plan recommendation to SNF portion of [place].  Wife is in agreement with this plan due to pt needing more A at this time compared to prior level of function.  Follow Up Recommendations  SNF     Equipment Recommendations  Other (comment) (TBD at SNF)    Recommendations for Other Services       Precautions / Restrictions Precautions Precautions: Fall Restrictions Weight Bearing Restrictions: No    Mobility  Bed Mobility Overal bed mobility: Needs Assistance Bed Mobility: Supine to Sit     Supine to sit: Min assist     General bed mobility comments: Cues for technique. MIN A to get trunk upright.  Transfers Overall transfer level: Needs assistance Equipment used: Rolling walker (2 wheeled) Transfers: Sit to/from Stand Sit to Stand: Min assist;From elevated surface         General transfer comment: MIN A from elevated surface with cues for hand placement  Ambulation/Gait Ambulation/Gait assistance: Min assist Ambulation Distance (Feet): 130 Feet Assistive device: Rolling walker (2 wheeled) Gait Pattern/deviations: Step-through pattern;Decreased step length - right;Decreased step length - left;Trunk flexed;Drifts right/left     General Gait Details: Pt with improved gait pattern compared to last session.  Tends to drift L and hit items on L side of hallway. Requires large space to turn.   Stairs            Wheelchair Mobility    Modified Rankin (Stroke Patients Only)       Balance Overall balance assessment: Needs  assistance Sitting-balance support: No upper extremity supported;Feet supported Sitting balance-Leahy Scale: Fair     Standing balance support: During functional activity Standing balance-Leahy Scale: Poor Standing balance comment: Brushed teeth at sink with reliance on counter to give trunk balance and support.                    Cognition Arousal/Alertness: Awake/alert Behavior During Therapy: WFL for tasks assessed/performed Overall Cognitive Status: History of cognitive impairments - at baseline Area of Impairment: Memory;Problem solving;Safety/judgement     Memory: Decreased short-term memory   Safety/Judgement: Decreased awareness of safety;Decreased awareness of deficits Awareness: Intellectual Problem Solving: Difficulty sequencing;Requires verbal cues;Slow processing;Requires tactile cues      Exercises      General Comments General comments (skin integrity, edema, etc.): Condom cath came off when he stood up. NT informed.      Pertinent Vitals/Pain Pain Assessment: Faces Pain Score: 4  Faces Pain Scale: Hurts a little bit Pain Location: surgical site Pain Descriptors / Indicators: Sore Pain Intervention(s): Monitored during session;Limited activity within patient's tolerance    Home Living Family/patient expects to be discharged to:: Private residence (independent living cottage at [facility]) Living Arrangements: Spouse/significant other Available Help at Discharge: Family;Available 24 hours/day Type of Home: Independent living facility Home Access: Stairs to enter   Home Layout: Two level;Bed/bath upstairs Home Equipment: Shower seat - built in Additional Comments: wife reports setting clothing out for patient but he is able to complete dressing with alot of extra time.  wife reports he does all showering alone  Prior Function Level of Independence: Independent      Comments: set up with ADLs   PT Goals (current goals can now be  found in the care plan section) Acute Rehab PT Goals Patient Stated Goal: get better and go home PT Goal Formulation: With patient Time For Goal Achievement: 03/26/16 Potential to Achieve Goals: Good Progress towards PT goals: Progressing toward goals    Frequency  Min 3X/week    PT Plan Discharge plan needs to be updated    Co-evaluation             End of Session Equipment Utilized During Treatment: Gait belt Activity Tolerance: Patient tolerated treatment well Patient left: in chair;with call bell/phone within reach;with chair alarm set;with family/visitor present;with nursing/sitter in room     Time: 1610-9604 PT Time Calculation (min) (ACUTE ONLY): 29 min  Charges:  $Gait Training: 8-22 mins $Therapeutic Activity: 8-22 mins                    G Codes:      Ademola Vert LUBECK 03/17/2016, 1:48 PM

## 2016-03-17 NOTE — Progress Notes (Signed)
PROGRESS NOTE    Cole Wilson  VOJ:[id number] DOB: 01/07/1932 DOA: 03/11/2016 PCP: [name], MD   Brief Narrative:  HPI on 03/11/2016 by [name] Cole Wilson is a 82 y.o. male with medical history significant of pancreatitis, dementia, depression, CKD-III, former alcohol user, who presents with abdominal pain.  Per his wife, patient started having abdominal pain at 12:00 PM. It is located in the periumbilical area, constant, 5 out of 10, dull, nonradiating. It is aggravated by deep breath. Patient does not have chest pain, SOB or cough. It is associated with diaphoresis. He has nausea, but no vomiting or diarrhea. Patient denies cough, fever or chills.  Of note, patient had recurrent UTI, and was started with daily trimethoprim for prophylaxis by urologist, [name] since last week. Patient denies symptoms of UTI. At arrival, pt was noted to be pale and diaphoretic with heart rate 45-50's sinus brady in ED. Of note, pt stopped drinking alcohol after he had second episode of pancreatitis last year.  Assessment & Plan   Acute on chronic pancreatitis/gallstones -Lipase on admission 5724, trended downward to 26 -[place] Abd: Mild wall thickening with small gallstones, fatty liver with mild biliary ductal dilatation, likely related to underlying pancreatitis.  -CT abd/pelvis findings are consistent with pancreatitis, and hepatobiliary system has no masses or other significant abnormality on CT scan -Continue pain control, IVF, antiemetics  -Triglycerides 70 -Gastroenterology consulted and appreciated  -Cardiology consulted for preop assessment, he is at increased risk due to multiple medical problems but is currently stable for surgery -General surgery consulted and appreciated, s/p laparoscopic cholecystectomy  -Currently on heart healthy diet  Atrial fibrillation -CHADSVASC 2 (age) -Patient not on anticoagulation -Recently saw his cardiologist at [facility], Dr.  Tereso Newcomer: "Persistent atrial fibrillation (RAF-HCC) Was discovered during recent ER visit with sepsis and hypotension heart rate has been well controlled on no treatment" -Spoke with [name], he only saw the patient one time. Thought Afib was due to UTI. Did not feel patient was a candidate for [facility] due to dementia.  -TSH and free T4 within normal limits -Echocardiogram EF55-60%, grade 1 diastolic dysfunction -Was initially placed on heparin -Discussed other NOAC options with patient's wife, she feels that would be appropriate -Case management consulted for insurance coverage -Currently patient is in sinus rhythm -Will start patient on Eliquis and monitor CBC closely  Bradycardia -TSH/FT4 within normal limits -Resolved -Continue tele monitoring  Dementia -Continue Namenda, donepezil  Chronic kidney disease, stage III -Stable, baseline creatinine 1.6-1.8 -Creatinine currently 1.06 -Continue to monitor BMP  Depression  -Continue Seroquel  Recurrent urinary tract infection -Patient currently on TMP prophylaxis -UA showed small amount leukocytes -Urine culture shows multiple species   Leukocytosis  -WBC elevated to 20.6- ?reactive -Today WBC 5.5 with no intervention -Continue to monitor CBC  Hypokalemia -Replaced, Potassium 3.7, will given additional dose of potassium  -Continue to monitor BMP  Normocytic Anemia/Anemia secondary to acute blood loss -s/p surgery? -Hemoglobin currently 8 -Will order anemia panel and FOBT -monitor CBC  Code status -spoke with patient's wife regarding code status, she stated he is a [name]   DVT Prophylaxis  Heparin  Code Status: [name]  Family Communication: none at bedside  Disposition Plan: Admitted. Continue to monitor CBC, starting on Eliquis  Consultants Gastroenterology General surgery Cardiology  Procedures  Echocardiogram Laparoscopic cholecystectomy   Antibiotics   Anti-infectives    Start     Dose/Rate Route  Frequency Ordered Stop   03/16/16 0845  metroNIDAZOLE (FLAGYL) IVPB 500 mg  Status:  Discontinued     500 mg 100 mL/hr over 60 Minutes Intravenous On call to O.R. 03/16/16 0404 03/16/16 1156   03/16/16 0845  ceFAZolin (ANCEF) IVPB 2g/100 mL premix     2 g 200 mL/hr over 30 Minutes Intravenous On call to O.R. 03/16/16 0404 03/16/16 1015   03/16/16 0600  ceFAZolin (ANCEF) IVPB 2g/100 mL premix  Status:  Discontinued     2 g 200 mL/hr over 30 Minutes Intravenous On call to O.R. 03/15/16 1118 03/16/16 0404   03/16/16 0600  metroNIDAZOLE (FLAGYL) IVPB 500 mg  Status:  Discontinued     500 mg 100 mL/hr over 60 Minutes Intravenous On call to O.R. 03/15/16 1118 03/16/16 0404   03/11/16 2200  trimethoprim (TRIMPEX) tablet 100 mg     100 mg Oral Daily 03/11/16 1953        Subjective:   Cole Wilson seen and examined today.  Patient has dementia. No complaints today. Denies nausea, vomiting, abdominal pain, chest pain, shortness of breath.   Objective:   Vitals:   03/16/16 1145 03/16/16 1208 03/16/16 2100 03/17/16 0637  BP:  (!) 111/53 137/62 (!) 152/69  Pulse:  (!) 55 64 77  Resp:  18 20 20   Temp: (!) 96.9 F (36.1 C) 97 F (36.1 C) 98.2 F (36.8 C) 98.3 F (36.8 C)  TempSrc:  Oral Oral Oral  SpO2:  100% 98% 95%  Weight:    92.2 kg (203 lb 4.8 oz)  Height:        Intake/Output Summary (Last 24 hours) at 03/17/16 1026 Last data filed at 03/17/16 9147  Gross per 24 hour  Intake             2742 ml  Output             2500 ml  Net              242 ml   Filed Weights   03/15/16 0602 03/16/16 0324 03/17/16 0637  Weight: 89.9 kg (198 lb 4.8 oz) 87.9 kg (193 lb 12.8 oz) 92.2 kg (203 lb 4.8 oz)    Exam  General: Well developed, well nourished, NAD, appears stated age  HEENT: NCAT,  mucous membranes moist.   Cardiovascular: S1 S2 auscultated, RRR, 2/6SEM  Respiratory: Clear to auscultation bilaterally with equal chest rise  Abdomen: Soft, nontender, nondistended, +  bowel sounds, incisions clean  Extremities: warm dry without cyanosis clubbing or edema  Neuro: AAOx2 (self and place), nonfocal  Skin: Without rashes exudates or nodules  Psych: Normal affect and demeanor, has dementia   Data Reviewed: I have personally reviewed following labs and imaging studies  CBC:  Recent Labs Lab 03/13/16 0520 03/14/16 0320 03/15/16 0300 03/16/16 0319 03/17/16 0254  WBC 12.1* 10.9* 20.6* 7.6 5.5  HGB 10.4* 10.4* 9.3* 9.0* 8.0*  HCT 31.9* 34.3* 28.9* 28.6* 25.4*  MCV 88.1 91.2 89.2 89.7 87.9  PLT 143* 137* 204 126* 141*   Basic Metabolic Panel:  Recent Labs Lab 03/12/16 0317 03/13/16 0520 03/14/16 0320 03/15/16 1002 03/16/16 0319  NA 138 142 138 135 140  K 4.4 4.2 3.8 3.4* 3.7  CL 104 110 109 112* 114*  CO2 25 24 21* 20* 20*  GLUCOSE 160* 77 63* 150* 102*  BUN 25* 24* 17 14 12   CREATININE 1.55* 1.46* 1.23 1.15 1.06  CALCIUM 9.0 8.6* 8.5* 7.8* 8.3*   GFR: Estimated Creatinine Clearance: 61.8 mL/min (by C-G  formula based on SCr of 1.06 mg/dL). Liver Function Tests:  Recent Labs Lab 03/11/16 1641 03/12/16 0317 03/13/16 0520 03/14/16 0320 03/16/16 0319  AST 104* 120* 44* 38 26  ALT 45 75* 39 31 20  ALKPHOS 121 125 96 103 83  BILITOT 1.0 0.8 0.6 0.7 0.7  PROT 6.5 6.7 5.5* 6.1* 5.5*  ALBUMIN 4.0 3.7 3.2* 3.3* 2.9*    Recent Labs Lab 03/12/16 0745 03/13/16 0520 03/13/16 1146 03/15/16 1002 03/16/16 0319  LIPASE 2,805* 436* 181* 25 26   No results for input(s): AMMONIA in the last 168 hours. Coagulation Profile:  Recent Labs Lab 03/11/16 2140  INR 1.11   Cardiac Enzymes: No results for input(s): CKTOTAL, CKMB, CKMBINDEX, TROPONINI in the last 168 hours. BNP (last 3 results) No results for input(s): PROBNP in the last 8760 hours. HbA1C: No results for input(s): HGBA1C in the last 72 hours. CBG:  Recent Labs Lab 03/15/16 1112 03/15/16 1558 03/15/16 2154 03/16/16 1607 03/16/16 2106  GLUCAP 124* 105* 105* 187*  111*   Lipid Profile: No results for input(s): CHOL, HDL, LDLCALC, TRIG, CHOLHDL, LDLDIRECT in the last 72 hours. Thyroid Function Tests: No results for input(s): TSH, T4TOTAL, FREET4, T3FREE, THYROIDAB in the last 72 hours. Anemia Panel: No results for input(s): VITAMINB12, FOLATE, FERRITIN, TIBC, IRON, RETICCTPCT in the last 72 hours. Urine analysis:    Component Value Date/Time   COLORURINE YELLOW 03/11/2016 1831   APPEARANCEUR CLEAR 03/11/2016 1831   LABSPEC 1.018 03/11/2016 1831   PHURINE 7.0 03/11/2016 1831   GLUCOSEU NEGATIVE 03/11/2016 1831   HGBUR NEGATIVE 03/11/2016 1831   BILIRUBINUR NEGATIVE 03/11/2016 1831   KETONESUR NEGATIVE 03/11/2016 1831   PROTEINUR NEGATIVE 03/11/2016 1831   UROBILINOGEN 0.2 06/21/2015 1800   NITRITE NEGATIVE 03/11/2016 1831   LEUKOCYTESUR SMALL (A) 03/11/2016 1831   Sepsis Labs: @LABRCNTIP (procalcitonin:4,lacticidven:4)  ) Recent Results (from the past 240 hour(s))  Blood culture (routine x 2)     Status: None   Collection Time: 03/11/16  4:41 PM  Result Value Ref Range Status   Specimen Description BLOOD LEFT ANTECUBITAL  Final   Special Requests BOTTLES DRAWN AEROBIC AND ANAEROBIC 5CC  Final   Culture NO GROWTH 5 DAYS  Final   Report Status 03/16/2016 FINAL  Final  Blood culture (routine x 2)     Status: None   Collection Time: 03/11/16  5:12 PM  Result Value Ref Range Status   Specimen Description BLOOD RIGHT ARM  Final   Special Requests IN PEDIATRIC BOTTLE 1CC  Final   Culture NO GROWTH 5 DAYS  Final   Report Status 03/16/2016 FINAL  Final  Urine culture     Status: Abnormal   Collection Time: 03/11/16  6:32 PM  Result Value Ref Range Status   Specimen Description URINE, RANDOM  Final   Special Requests NONE  Final   Culture MULTIPLE SPECIES PRESENT, SUGGEST RECOLLECTION (A)  Final   Report Status 03/13/2016 FINAL  Final  MRSA PCR Screening     Status: None   Collection Time: 03/12/16  4:33 AM  Result Value Ref Range Status    MRSA by PCR NEGATIVE NEGATIVE Final    Comment:        The GeneXpert MRSA Assay (FDA approved for NASAL specimens only), is one component of a comprehensive MRSA colonization surveillance program. It is not intended to diagnose MRSA infection nor to guide or monitor treatment for MRSA infections.       Radiology Studies: No results found.  Scheduled Meds: . aspirin EC  81 mg Oral Daily  . calcium-vitamin D  1 tablet Oral Q breakfast  . donepezil  10 mg Oral QHS  . memantine  10 mg Oral BID  . multivitamin  1 tablet Oral Daily  . multivitamin with minerals  1 tablet Oral Daily  . QUEtiapine  50 mg Oral QHS  . tamsulosin  0.4 mg Oral QHS  . trimethoprim  100 mg Oral Daily  . vitamin C  1,000 mg Oral Daily   Continuous Infusions: . sodium chloride 100 mL/hr at 03/16/16 1330  . heparin 1,350 Units/hr (03/17/16 0615)     LOS: 5 days   Time Spent in minutes   30 minutes  Johnmichael Melhorn D.O. on 03/17/2016 at 10:26 AM  Between 7am to 7pm - Pager - [phone]  After 7pm go to [email or web address] - password [email or web address]  And look for the night coverage person covering for me after hours  [facility] Office  [phone]

## 2016-03-17 NOTE — Progress Notes (Signed)
CDischarge [facility]/Cole Wilson Progress Note 1 Day Post-Op  Subjective: Patient feeling well and in good spirits  Objective: Vital signs in last 24 hours: Temp:  [96.9 F (36.1 C)-98.3 F (36.8 C)] 98.3 F (36.8 C) (08/01 0637) Pulse Rate:  [55-81] 77 (08/01 0637) Resp:  [13-20] 20 (08/01 0637) BP: (111-152)/(51-69) 152/69 (08/01 0637) SpO2:  [93 %-100 %] 95 % (08/01 0637) Weight:  [92.2 kg (203 lb 4.8 oz)] 92.2 kg (203 lb 4.8 oz) (08/01 0637) Last BM Date: 03/10/16  Intake/Output from previous day: 07/31 0701 - 08/01 0700 In: 1390 [P.O.:440; I.V.:700; IV Piggyback:250] Out: 2175 [Urine:2150; Blood:25] Intake/Output this shift: Total I/O In: 1602 [P.O.:240; I.V.:1362] Out: 325 [Urine:325]  General: No distress  Lungs: Clear  Abd: Soft, excellent bowel sounds  Extremities: No changes  Neuro: Intact  Lab Results:  @LABLAST2 (wbc:2,hgb:2,hct:2,plt:2) BMET ) Recent Labs  03/15/16 1002 03/16/16 0319  NA 135 140  K 3.4* 3.7  CL 112* 114*  CO2 20* 20*  GLUCOSE 150* 102*  BUN 14 12  CREATININE 1.15 1.06  CALCIUM 7.8* 8.3*   PT/INR No results for input(s): LABPROT, INR in the last 72 hours. ABG No results for input(s): PHART, HCO3 in the last 72 hours.  Invalid input(s): PCO2, PO2  Studies/Results: No results found.  Anti-infectives: Anti-infectives    Start     Dose/Rate Route Frequency Ordered Stop   03/16/16 0845  metroNIDAZOLE (FLAGYL) IVPB 500 mg  Status:  Discontinued     500 mg 100 mL/hr over 60 Minutes Intravenous On call to O.R. 03/16/16 0404 03/16/16 1156   03/16/16 0845  ceFAZolin (ANCEF) IVPB 2g/100 mL premix     2 g 200 mL/hr over 30 Minutes Intravenous On call to O.R. 03/16/16 0404 03/16/16 1015   03/16/16 0600  ceFAZolin (ANCEF) IVPB 2g/100 mL premix  Status:  Discontinued     2 g 200 mL/hr over 30 Minutes Intravenous On call to O.R. 03/15/16 1118 03/16/16 0404   03/16/16 0600  metroNIDAZOLE (FLAGYL) IVPB 500 mg  Status:  Discontinued     500  mg 100 mL/hr over 60 Minutes Intravenous On call to O.R. 03/15/16 1118 03/16/16 0404   03/11/16 2200  trimethoprim (TRIMPEX) tablet 100 mg     100 mg Oral Daily 03/11/16 1953        Assessment/Plan: s/p Procedure(s): LAPAROSCOPIC CHOLECYSTECTOMY WITH ATTEMPTED INTRAOPERATIVE CHOLANGIOGRCan go home from our standpoint.Discharge Can go home from our standpoint.  LOS: 5 days   Marta Lamas. Gae Bon, MD, FACS [phone] [facility] 03/17/2016

## 2016-03-17 NOTE — Evaluation (Signed)
Occupational Therapy Evaluation Patient Details Name: Cole Wilson MRN: [id number] DOB: [birth year] Today's Date: 03/17/2016    History of Present Illness 82 y.o. male with h/o dementia, chronic renal disease, EtOH, depression admitted from home with pancreatitis, bradycardia. Pt is s/p lap-chole (03/16/16) due to gallstones.   Clinical Impression   Pt with decline in function and safety with ADLs and ADL mobility with decreased strength, balance, endurance and cognition. Pt would benefit from acute OT services to address impairments to increase level of function and safety    Follow Up Recommendations  SNF;Supervision/Assistance - 24 hour    Equipment Recommendations   (TBD)    Recommendations for Other Services       Precautions / Restrictions Precautions Precautions: Fall Restrictions Weight Bearing Restrictions: No      Mobility Bed Mobility Overal bed mobility: Needs Assistance Bed Mobility: Supine to Sit     Supine to sit: Mod assist     General bed mobility comments: Assist for trunk and bil LEs. Increased time. Multimodal cueing for technique, completion of task  Transfers Overall transfer level: Needs assistance Equipment used: Rolling walker (2 wheeled) Transfers: Sit to/from Stand Sit to Stand: Mod assist         General transfer comment: Multimodal cueing for hand/LE placement. Assist to shift weight anteriorly, rise, stabilize, control descent.     Balance   Sitting-balance support: No upper extremity supported;Feet supported Sitting balance-Leahy Scale: Fair     Standing balance support: During functional activity Standing balance-Leahy Scale: Poor                              ADL Overall ADL's : Needs assistance/impaired     Grooming: Wash/dry hands;Wash/dry face;Oral care;Set up;Supervision/safety;Sitting   Upper Body Bathing: Minimal assitance;Sitting   Lower Body Bathing: Moderate assistance;Sit to/from  stand;Sitting/lateral leans;Cueing for safety   Upper Body Dressing : Minimal assistance;Sitting   Lower Body Dressing: Maximal assistance;Sit to/from stand;Cueing for safety   Toilet Transfer: RW;Moderate assistance;Comfort height toilet;Grab bars;Ambulation;Cueing for safety Toilet Transfer Details (indicate cue type and reason): needed cues to push up with bil hands. Pt stepping over threshold as if the doorway was 7-8 inches high. Pt overshooting to complete transfer.  Toileting- Architect and Hygiene: Sit to/from stand;Moderate assistance;Minimal assistance       Functional mobility during ADLs: Moderate assistance;Rolling walker;Cueing for safety General ADL Comments: pt requires incr (A) compared to baseline. wife reports patient is at a level that will requries SNF care. Pt and wife reside at [facility] independent living and are agreeable to return to [place] [facility] level for rehab     Vision  no change from baseline              Pertinent Vitals/Pain Pain Assessment: Faces Pain Score: 4  Faces Pain Scale: Hurts a little bit Pain Location: surgical site Pain Descriptors / Indicators: Sore Pain Intervention(s): Monitored during session;Limited activity within patient's tolerance     Hand Dominance Right   Extremity/Trunk Assessment Upper Extremity Assessment Upper Extremity Assessment: Generalized weakness   Lower Extremity Assessment Lower Extremity Assessment: Defer to PT evaluation   Cervical / Trunk Assessment Cervical / Trunk Assessment: Kyphotic   Communication Communication Communication: No difficulties   Cognition Arousal/Alertness: Awake/alert Behavior During Therapy: WFL for tasks assessed/performed Overall Cognitive Status: History of cognitive impairments - at baseline Area of Impairment: Memory;Problem solving;Safety/judgement     Memory: Decreased short-term memory  Safety/Judgement: Decreased awareness of safety;Decreased  awareness of deficits Awareness: Intellectual Problem Solving: Difficulty sequencing;Requires verbal cues;Slow processing;Requires tactile cues     General Comments   pt very pleasant and cooperative                 Home Living Family/patient expects to be discharged to:: Private residence (independent living cottage at [facility]) Living Arrangements: Spouse/significant other Available Help at Discharge: Family;Available 24 hours/day Type of Home: Independent living facility Home Access: Stairs to enter     Home Layout: Two level;Bed/bath upstairs Alternate Level Stairs-Number of Steps: 3   Bathroom Shower/Tub: Producer, television/film/video: Standard     Home Equipment: Shower seat - built in   Additional Comments: wife reports setting clothing out for patient but he is able to complete dressing with alot of extra time.  wife reports he does all showering alone       Prior Functioning/Environment Level of Independence: Independent        Comments: set up with ADLs    OT Diagnosis: Generalized weakness;Cognitive deficits;Acute pain   OT Problem List: Decreased strength;Decreased activity tolerance;Impaired balance (sitting and/or standing);Decreased cognition;Decreased safety awareness;Decreased knowledge of precautions;Decreased knowledge of use of DME or AE   OT Treatment/Interventions: Self-care/ADL training;Therapeutic exercise;DME and/or AE instruction;Therapeutic activities;Patient/family education    OT Goals(Current goals can be found in the care plan section) Acute Rehab OT Goals Patient Stated Goal: get better and go home OT Goal Formulation: With patient/family Time For Goal Achievement: 03/24/16 Potential to Achieve Goals: Good ADL Goals Pt Will Perform Grooming: with min guard assist;standing Pt Will Perform Upper Body Bathing: with supervision;with min guard assist;with set-up;sitting Pt Will Perform Lower Body Bathing: with min  assist;sitting/lateral leans;sit to/from stand;with caregiver independent in assisting Pt Will Transfer to Toilet: with min assist;grab bars;ambulating (RW, 3 in 1) Pt Will Perform Toileting - Clothing Manipulation and hygiene: with min assist;with min guard assist;sitting/lateral leans;sit to/from stand;with caregiver independent in assisting Additional ADL Goal #1: Pt will complete bed mobility with min A/min guard A to sit EOB in prep for ADLs and transfers  OT Frequency: Min 2X/week   Barriers to D/C: Decreased caregiver support  going to SNF level at [facility] for rehab                     End of Session Equipment Utilized During Treatment: Gait belt;Rolling walker;Other (comment) (3 in 1) Nurse Communication: Mobility status;Precautions  Activity Tolerance: Patient tolerated treatment well Patient left: in chair;with nursing/sitter in room (seated in chair at sink with nurse tech to ambulate back to bed)   Time: 5750-5183 OT Time Calculation (min): 34 min Charges:  OT Evaluation $OT Re-eval: 1 Procedure OT Treatments $Self Care/Home Management : 8-22 mins $Therapeutic Activity: 8-22 mins G-Codes:    Galen Manila 03/17/2016, 1:42 PM

## 2016-03-17 NOTE — Care Management Important Message (Signed)
Important Message  Patient Details  Name: Cole Wilson MRN: [id number] Date of Birth: 04/25/1932   Medicare Important Message Given:  Yes    Bernadette Hoit 03/17/2016, 8:12 AM

## 2016-03-18 LAB — BASIC METABOLIC PANEL
Anion gap: 6 (ref 5–15)
BUN: 8 mg/dL (ref 6–20)
CO2: 24 mmol/L (ref 22–32)
CREATININE: 1.09 mg/dL (ref 0.61–1.24)
Calcium: 8.4 mg/dL — ABNORMAL LOW (ref 8.9–10.3)
Chloride: 111 mmol/L (ref 101–111)
GFR calc Af Amer: >60 mL/min (ref >60)
Glucose, Bld: 90 mg/dL (ref 65–99)
POTASSIUM: 3.5 mmol/L (ref 3.5–5.1)
SODIUM: 141 mmol/L (ref 135–145)

## 2016-03-18 LAB — CBC
HCT: 24.7 % — ABNORMAL LOW (ref 39.0–52.0)
Hemoglobin: 8 g/dL — ABNORMAL LOW (ref 13.0–17.0)
MCH: 28.4 pg (ref 26.0–34.0)
MCHC: 32.4 g/dL (ref 30.0–36.0)
MCV: 87.6 fL (ref 78.0–100.0)
PLATELETS: 164 10*3/uL (ref 150–400)
RBC: 2.82 MIL/uL — AB (ref 4.22–5.81)
RDW: 13.8 % (ref 11.5–15.5)
WBC: 5.2 10*3/uL (ref 4.0–10.5)

## 2016-03-18 MED ORDER — SODIUM CHLORIDE 0.9 % IV SOLN
510.0000 mg | Freq: Once | INTRAVENOUS | Status: AC
  Administered 2016-03-18: 510 mg via INTRAVENOUS
  Filled 2016-03-18: qty 17

## 2016-03-18 MED ORDER — APIXABAN 5 MG PO TABS: 5.0000 mg | ORAL_TABLET | Freq: Two times a day (BID) | ORAL | 0 refills | Status: DC

## 2016-03-18 MED ORDER — POTASSIUM CHLORIDE CRYS ER 20 MEQ PO TBCR
40.0000 meq | EXTENDED_RELEASE_TABLET | Freq: Once | ORAL | Status: AC
  Administered 2016-03-18: 40 meq via ORAL
  Filled 2016-03-18: qty 2

## 2016-03-18 MED ORDER — QUETIAPINE FUMARATE 50 MG PO TABS: 50.0000 mg | ORAL_TABLET | Freq: Every day | ORAL | 0 refills | Status: DC

## 2016-03-18 NOTE — Clinical Social Work Note (Signed)
CSW facilitated patient discharge including contacting patient family and facility to confirm patient discharge plans. Clinical information faxed to facility and family agreeable with plan. CSW arranged ambulance transport via [facility] to [place] at [place] around 1:00 pm. RN to call report prior to discharge [phone] Room [phone]).  CSW will sign off for now as social work intervention is no longer needed. Please consult [place] again if new needs arise.  Charlynn Court, CSW [phone]

## 2016-03-18 NOTE — Discharge Summary (Signed)
Physician Discharge Summary  Cole Wilson [name]:[id number] DOB: May 31, 1932 DOA: 03/11/2016  PCP: [name], MD  Admit date: 03/11/2016 Discharge date: 03/18/2016  Time spent: 45 minutes  Recommendations for Outpatient Follow-up:  Patient will be discharged to skilled nursing facility, continue physical and occupational therapy.  Patient will need to follow up with primary care provider within one week of discharge, repeat CBC.  Follow up with general surgery in 1-2 weeks. Patient should continue medications as prescribed.  Patient should follow a Heart healthy diet.   Discharge Diagnoses:  Acute on chronic pancreatitis/gallstones Atrial fibrillation Bradycardia Dementia Chronic kidney disease, stage III Depression  Recurrent urinary tract infection Leukocytosis  Hypokalemia Normocytic Anemia/Anemia secondary to acute blood loss  Discharge Condition: Stable  Diet recommendation: heart healthy  Filed Weights   03/16/16 0324 03/17/16 0637 03/18/16 0636  Weight: 87.9 kg (193 lb 12.8 oz) 92.2 kg (203 lb 4.8 oz) 87.1 kg (192 lb)    History of present illness:  on 03/11/2016 by Dr. Pablo Ledger Steinertis a 82 y.o.malewith medical history significant of pancreatitis, dementia, depression, CKD-III, former alcohol user, who presents with abdominal pain.  Per his wife, patient started having abdominal pain at 12:00 PM. It is located in the periumbilical area, constant, 5 out of 10, dull, nonradiating. It is aggravated by deep breath. Patient does not have chest pain, SOB or cough. It is associated with diaphoresis. He has nausea, but no vomiting or diarrhea. Patient denies cough, fever or chills. Of note,patient had recurrent UTI, and was started with dailytrimethoprim for prophylaxis by urologist, [name] since last week. Patient denies symptoms of UTI. At arrival, pt was noted to be pale and diaphoretic with heart rate 45-50's sinus brady in ED. Of note,  pt stopped drinking alcohol after he had second episode of pancreatitis last year.  Hospital Course:  Acute on chronic pancreatitis/gallstones -Lipase on admission 5724, trended downward to 26 -[place] Abd: Mild wall thickening with small gallstones, fatty liver with mild biliary ductal dilatation, likely related to underlying pancreatitis.  -CT abd/pelvis findings are consistent with pancreatitis, and hepatobiliary system has no masses or other significant abnormality on CT scan -Continue pain control, IVF, antiemetics  -Triglycerides 70 -Gastroenterology consulted and appreciated  -Cardiology consulted for preop assessment, he is at increased risk due to multiple medical problems but is currently stable for surgery -General surgery consulted and appreciated, s/p laparoscopic cholecystectomy  -Currently on heart healthy diet and tolerating it well  Atrial fibrillation -CHADSVASC 2 (age) -Patient not on anticoagulation -Recently saw his cardiologist at [facility], [name]: "Persistent atrial fibrillation (RAF-HCC) Was discovered during recent ER visit with sepsis and hypotension heart rate has been well controlled on no treatment" -Spoke with [name], he only saw the patient one time. Thought Afib was due to UTI. Did not feel patient was a candidate for [facility] due to dementia.  -TSH and free T4 within normal limits -Echocardiogram EF55-60%, grade 1 diastolic dysfunction -Was initially placed on heparin -Discussed other NOAC options with patient's wife, she feels that would be appropriate -Case management consulted for insurance coverage -Currently patient is in sinus rhythm -continue eliquis  Bradycardia -TSH/FT4 within normal limits -Resolved -Continue tele monitoring  Dementia -Continue Namenda, donepezil  Chronic kidney disease, stage III -Stable, baseline creatinine 1.6-1.8 -Creatinine currently 1.09  Depression  -Continue Seroquel  Recurrent urinary tract  infection -Patient currently on TMP prophylaxis -UA showed small amount leukocytes -Urine culture shows multiple species   Leukocytosis  -  WBC elevated to 20.6- ?reactive -Today WBC 5.2 with no intervention  Hypokalemia -Replaced, Potassium 3.57, given additional dose of potassium   Normocytic Anemia/Anemia secondary to acute blood loss -s/p surgery? -Hemoglobin currently 8 -Anemia panel:Iron 14, ferritin 320 -Repeat CBC in one week  Code Status: [name]  Consultants Gastroenterology General surgery Cardiology  Procedures  Echocardiogram Laparoscopic cholecystectomy   Discharge Exam: Vitals:   03/17/16 2000 03/18/16 0636  BP: (!) 169/80 (!) 148/60  Pulse: 82 67  Resp:  18  Temp: 98.2 F (36.8 C) 98.5 F (36.9 C)    Exam  General: Well developed, well nourished, NAD, appears stated age  HEENT: NCAT,  mucous membranes moist.   Cardiovascular: S1 S2 auscultated, RRR, 2/6SEM  Respiratory: Clear to auscultation bilaterally with equal chest rise  Abdomen: Soft, nontender, nondistended, + bowel sounds, incisions clean  Extremities: warm dry without cyanosis clubbing or edema  Neuro: AAOx2 (self and place), nonfocal  Skin: Without rashes exudates or nodules  Psych: Normal affect and demeanor, has dementia  Discharge Instructions  Discharge Instructions    Discharge instructions    Complete by:  As directed   Patient will be discharged to skilled nursing facility, continue physical and occupational therapy.  Patient will need to follow up with primary care provider within one week of discharge, repeat CBC.  Follow up with general surgery in 1-2 weeks. Patient should continue medications as prescribed.  Patient should follow a Heart healthy diet.       Medication List    STOP taking these medications   amoxicillin 500 MG capsule Commonly known as:  AMOXIL     TAKE these medications   acetaminophen 160 MG/5ML liquid Commonly known as:  TYLENOL Take  325 mg by mouth every 4 (four) hours as needed for fever.   apixaban 5 MG Tabs tablet Commonly known as:  ELIQUIS Take 1 tablet (5 mg total) by mouth 2 (two) times daily.   aspirin 81 MG tablet Take 81 mg by mouth daily.   CALCIUM 600+D 600-800 MG-UNIT Tabs Generic drug:  Calcium Carb-Cholecalciferol Take 1 tablet by mouth daily.   donepezil 10 MG tablet Commonly known as:  ARICEPT Take 1 tablet (10 mg total) by mouth at bedtime.   feeding supplement (ENSURE ENLIVE) Liqd Take 237 mLs by mouth 2 (two) times daily between meals.   GLUCOSAMINE 1500 COMPLEX PO Take 1 tablet by mouth daily.   memantine 10 MG tablet Commonly known as:  NAMENDA TAKE 1 TABLET BY MOUTH TWICE DAILY   multivitamin capsule Take 1 capsule by mouth daily.   QUEtiapine 50 MG tablet Commonly known as:  SEROQUEL Take 1 tablet (50 mg total) by mouth at bedtime. What changed:  See the new instructions.   tamsulosin 0.4 MG Caps capsule Commonly known as:  FLOMAX Take 1 capsule (0.4 mg total) by mouth at bedtime.   TRIMETHOPRIM PO Take 100 mg by mouth at bedtime.   vitamin C 1000 MG tablet Take 1,000 mg by mouth daily.   VITEYES AREDS FORMULA Caps Take 1 capsule by mouth daily.      No Known Allergies Follow-up Information    [name], MD Follow up on 03/25/2016.   Specialty:  Family Medicine Why:  At 11:30am for hospital follow up appt.  Contact information: [address] [place] [address] [phone]        Jimmye Norman, MD. Schedule an appointment as soon as possible for a visit in 1 week(s).  Specialty:  General Surgery Why:  Hospital follow up, gallbladde surgery Contact information: [address] [place] [address] [phone]            The results of significant diagnostics from this hospitalization (including imaging, microbiology, ancillary and laboratory) are listed below for reference.    Significant Diagnostic Studies: US Abdomen  Complete  Result Date: 03/11/2016 CLINICAL DATA:  Dull abdominal pain with pancreatitis on recent CT examination EXAM: ABDOMEN ULTRASOUND COMPLETE COMPARISON:  CT from earlier in the same day FINDINGS: Gallbladder: Well distended with mild gallbladder wall thickening to 3.8 mm. Some dependent tiny gallstones are seen. No pericholecystic fluid is noted. Common bile duct: Diameter: 5 mm. Liver: Slight increased echogenicity is noted consistent with fatty infiltration similar to that seen on recent CT examination. Minimal biliary ductal dilatation is seen likely related to the underlying pancreatitis. IVC: Not well visualized Pancreas: Not visualized due to overlying bowel gas. Spleen: Size and appearance within normal limits. Right Kidney: Length: 10.8 cm. 1.5 cm cyst is noted similar to that seen on recent CT examination. Left Kidney: Length: 11.6 cm. Echogenicity within normal limits. No mass or hydronephrosis visualized. Abdominal aorta: Not visualized due to overlying bowel gas. Other findings: None. IMPRESSION: Examination is limited due to overlying bowel gas. Mild wall thickening with small gallstones. Fatty liver with mild biliary ductal dilatation. The ductal dilatation is likely related to the underlying pancreatitis. Electronically Signed   By: Alcide Clever M.D.   On: 03/11/2016 20:44  Ct Abdomen Pelvis W Contrast  Result Date: 03/11/2016 CLINICAL DATA:  Diffuse abdominal pain started at noon. EXAM: CT ABDOMEN AND PELVIS WITH CONTRAST TECHNIQUE: Multidetector CT imaging of the abdomen and pelvis was performed using the standard protocol following bolus administration of intravenous contrast. CONTRAST:  58mL ISOVUE-300 IOPAMIDOL (ISOVUE-300) INJECTION 61% COMPARISON:  None. FINDINGS: Lower chest:  Mild bibasilar atelectasis. Hepatobiliary: No masses or other significant abnormality. Pancreas: Severe peripancreatic inflammatory changes and fluid consistent with acute pancreatitis. Pancreas enhances  normally and homogeneously without areas of pancreatic necrosis. Spleen: Within normal limits in size and appearance. Adrenals/Urinary Tract: Normal adrenal glands. Normal kidneys. Air within the bladder which may be secondary to instrumentation, but in the absence of instrumentation this can be seen with cystitis. Stomach/Bowel: No bowel wall thickening or dilatation. No pneumatosis, pneumoperitoneum or portal venous gas. Normal appendix. Colonic diverticulosis without evidence of diverticulitis. No pelvic free fluid. Vascular/Lymphatic: Abdominal aortic atherosclerosis. Normal caliber abdominal aorta. Other: None. Musculoskeletal: No aggressive lytic or sclerotic osseous lesion. No acute osseous abnormality. Severe bilateral facet arthropathy at L4-5 and L5-S1. Degenerative disc disease at L4-5 and L5-S1. IMPRESSION: 1. Findings consistent with acute pancreatitis. No focal fluid collection to suggest a pseudocyst or abscess. 2. Air within the bladder which may be secondary to instrumentation, but in the absence of instrumentation this can be seen with cystitis. 3. Diverticulosis without evidence of diverticulitis. Electronically Signed   By: Elige Ko   On: 03/11/2016 18:38  Dg Chest Portable 1 View  Result Date: 03/11/2016 CLINICAL DATA:  Chest discomfort EXAM: PORTABLE CHEST 1 VIEW COMPARISON:  12/29/2015 FINDINGS: The heart size and mediastinal contours are within normal limits. Both lungs are clear. The visualized skeletal structures are unremarkable. IMPRESSION: No active disease. Electronically Signed   By: Alcide Clever M.D.   On: 03/11/2016 17:50   Microbiology: Recent Results (from the past 240 hour(s))  Blood culture (routine x 2)     Status: None   Collection  Time: 03/11/16  4:41 PM  Result Value Ref Range Status   Specimen Description BLOOD LEFT ANTECUBITAL  Final   Special Requests BOTTLES DRAWN AEROBIC AND ANAEROBIC 5CC  Final   Culture NO GROWTH 5 DAYS  Final   Report Status  03/16/2016 FINAL  Final  Blood culture (routine x 2)     Status: None   Collection Time: 03/11/16  5:12 PM  Result Value Ref Range Status   Specimen Description BLOOD RIGHT ARM  Final   Special Requests IN PEDIATRIC BOTTLE 1CC  Final   Culture NO GROWTH 5 DAYS  Final   Report Status 03/16/2016 FINAL  Final  Urine culture     Status: Abnormal   Collection Time: 03/11/16  6:32 PM  Result Value Ref Range Status   Specimen Description URINE, RANDOM  Final   Special Requests NONE  Final   Culture MULTIPLE SPECIES PRESENT, SUGGEST RECOLLECTION (A)  Final   Report Status 03/13/2016 FINAL  Final  MRSA PCR Screening     Status: None   Collection Time: 03/12/16  4:33 AM  Result Value Ref Range Status   MRSA by PCR NEGATIVE NEGATIVE Final    Comment:        The GeneXpert MRSA Assay (FDA approved for NASAL specimens only), is one component of a comprehensive MRSA colonization surveillance program. It is not intended to diagnose MRSA infection nor to guide or monitor treatment for MRSA infections.      Labs: Basic Metabolic Panel:  Recent Labs Lab 03/13/16 0520 03/14/16 0320 03/15/16 1002 03/16/16 0319 03/18/16 0438  NA 142 138 135 140 141  K 4.2 3.8 3.4* 3.7 3.5  CL 110 109 112* 114* 111  CO2 24 21* 20* 20* 24  GLUCOSE 77 63* 150* 102* 90  BUN 24* 17 14 12 8   CREATININE 1.46* 1.23 1.15 1.06 1.09  CALCIUM 8.6* 8.5* 7.8* 8.3* 8.4*   Liver Function Tests:  Recent Labs Lab 03/11/16 1641 03/12/16 0317 03/13/16 0520 03/14/16 0320 03/16/16 0319  AST 104* 120* 44* 38 26  ALT 45 75* 39 31 20  ALKPHOS 121 125 96 103 83  BILITOT 1.0 0.8 0.6 0.7 0.7  PROT 6.5 6.7 5.5* 6.1* 5.5*  ALBUMIN 4.0 3.7 3.2* 3.3* 2.9*    Recent Labs Lab 03/12/16 0745 03/13/16 0520 03/13/16 1146 03/15/16 1002 03/16/16 0319  LIPASE 2,805* 436* 181* 25 26   No results for input(s): AMMONIA in the last 168 hours. CBC:  Recent Labs Lab 03/14/16 0320 03/15/16 0300 03/16/16 0319  03/17/16 0254 03/18/16 0438  WBC 10.9* 20.6* 7.6 5.5 5.2  HGB 10.4* 9.3* 9.0* 8.0* 8.0*  HCT 34.3* 28.9* 28.6* 25.4* 24.7*  MCV 91.2 89.2 89.7 87.9 87.6  PLT 137* 204 126* 141* 164   Cardiac Enzymes: No results for input(s): CKTOTAL, CKMB, CKMBINDEX, TROPONINI in the last 168 hours. BNP: BNP (last 3 results)  Recent Labs  06/21/15 1003 03/11/16 2140  BNP 176.3* 73.0    ProBNP (last 3 results) No results for input(s): PROBNP in the last 8760 hours.  CBG:  Recent Labs Lab 03/15/16 1112 03/15/16 1558 03/15/16 2154 03/16/16 1607 03/16/16 2106  GLUCAP 124* 105* 105* 187* 111*       Signed:  Jaydeen Odor  [facility] 03/18/2016, 10:21 AM

## 2016-05-11 ENCOUNTER — Telehealth: Payer: Self-pay | Admitting: Cardiology

## 2016-05-11 NOTE — Telephone Encounter (Signed)
Records received from [place] [facility] [employer] for [facility] on 05/13/16 @ [place] office with Dr Jens Somrenshaw. Records given to [employer] (medical records) CN

## 2016-05-11 NOTE — Progress Notes (Signed)
HPI: 82 year old male for FU of atrial fibrillation. Abdominal MR December 2016 showed no aneurysm. Patient apparently had atrial fibrillation in June 2017 at [facility] when he was hospitalized with urosepsis and hypotension. He was not treated with anticoagulation. During hospitalization at [facility] in July he was started on apixaban. Echocardiogram 03/12/2016 showed normal LV function, grade 1 diastolic dysfunction, mild aortic stenosis with mean gradient 10 mmHg and mildly dilated ascending aorta. Laboratories July 2017 showed normal TSH. Laboratories August 2017 showed creatinine 1.09 and hemoglobin 8 with MCV 87.6. Patient has significant dementia. However his wife states he does not fall. He resides in a memory care unit. He denies dyspnea, chest pain, palpitations, syncope or bleeding.  Current Outpatient Prescriptions  Medication Sig Dispense Refill  . apixaban (ELIQUIS) 5 MG TABS tablet Take 1 tablet (5 mg total) by mouth 2 (two) times daily. 60 tablet 0  . Ascorbic Acid (VITAMIN C) 1000 MG tablet Take 1,000 mg by mouth daily.    [name]. aspirin 81 MG tablet Take 81 mg by mouth daily.    . Calcium Carb-Cholecalciferol (CALCIUM 600+D) 600-800 MG-UNIT TABS Take 1 tablet by mouth daily.    [name]. donepezil (ARICEPT) 10 MG tablet Take 1 tablet (10 mg total) by mouth at bedtime. 90 tablet 3  . ferrous sulfate 325 (65 FE) MG EC tablet Take 325 mg by mouth daily.    . Glucosamine-Chondroit-Vit C-Mn (GLUCOSAMINE 1500 COMPLEX PO) Take 1 tablet by mouth daily.    . memantine (NAMENDA) 10 MG tablet TAKE 1 TABLET BY MOUTH TWICE DAILY 60 tablet 11  . Multiple Vitamin (MULTIVITAMIN) capsule Take 1 capsule by mouth daily.      . Multiple Vitamins-Minerals (VITEYES AREDS FORMULA) CAPS Take 1 capsule by mouth daily.     . QUEtiapine (SEROQUEL) 50 MG tablet Take by mouth at bedtime. Take 75 mg by mouth once daily    . tamsulosin (FLOMAX) 0.4 MG CAPS capsule Take 1 capsule (0.4 mg total) by  mouth at bedtime. 30 capsule   . TRIMETHOPRIM PO Take 100 mg by mouth at bedtime.     . feeding supplement, ENSURE ENLIVE, (ENSURE ENLIVE) LIQD Take 237 mLs by mouth 2 (two) times daily between meals. 237 mL 12   No current facility-administered medications for this visit.     No Known Allergies   Past Medical History:  Diagnosis Date  . Atrial fibrillation (HCC)   . CKD (chronic kidney disease), stage III   . Dementia   . Depression   . Pancreatitis     Past Surgical History:  Procedure Laterality Date  . ANKLE FUSION Right 1970/1990  . APPENDECTOMY    . CHOLECYSTECTOMY N/A 03/16/2016   Procedure: LAPAROSCOPIC CHOLECYSTECTOMY WITH ATTEMPTED INTRAOPERATIVE CHOLANGIOGRAM;  Surgeon: Jimmye NormanJames Wyatt, MD;  [facility];  Service: General;  Laterality: N/A;  . KNEE ARTHROSCOPY Left 2002  . TONSILLECTOMY  1952  . TOTAL KNEE ARTHROPLASTY Left 2009    Social History   Social History  . Marital status: Married    Spouse name: N/A  . Number of children: 3  . Years of education: MA   Occupational History  . retired    Social History Main Topics  . Smoking status: Never Smoker  . Smokeless tobacco: Never Used  . Alcohol use No  . Drug use: No  . Sexual activity: Not on file   Other Topics Concern  . Not on file   Social History Narrative  Patient lives at home with his wife [name]) They both live at [place].   Retired.   Education Masters.   Right handed.   Caffeine one cup of tea daily.       Quit alcohol after pancreatitis x 2 last yr.  2016    Family History  Problem Relation Age of Onset  . Cancer Mother     breast, uterine  . Cancer - Lung Father   . Dementia Paternal Grandmother     ROS: no fevers or chills, productive cough, hemoptysis, dysphasia, odynophagia, melena, hematochezia, dysuria, hematuria, rash, seizure activity, orthopnea, PND, pedal edema, claudication. Remaining systems are negative.  Physical Exam:   Blood pressure 124/64, pulse  64, height 6' (1.829 m), weight 181 lb (82.1 kg).  General:  Well developed/well nourished in NAD Skin warm/dry Patient not depressed No peripheral clubbing Back-normal HEENT-normal/normal eyelids Neck supple/normal carotid upstroke bilaterally; no bruits; no JVD; no thyromegaly chest - CTA/ normal expansion CV - RRR/normal S1 and S2; no murmurs, rubs or gallops;  PMI nondisplaced Abdomen -NT/ND, no HSM, no mass, + bowel sounds, no bruit 2+ femoral pulses, no bruits Ext-no edema, chords, 2+ DP Neuro-grossly nonfocal  ECG 03/16/2016-sinus rhythm with PACs.  Electrocardiogram today shows sinus rhythm with PACs. No ST changes.  A/P  1 paroxysmal atrial fibrillation-the patient has had 2 documented episodes of atrial fibrillation based on his wife's report. His age greater than 75 gives a CHADS vasc score of 2. I had a long discussion with patient and his wife today concerning risks and benefits of anticoagulation. I explained the risk of CVA on anticoagulation. I explained the risk of bleeding with falls. She feels that he is very stable and has no history of falls. He is in a protected environment in the memory care unit. She would like to avoid CVA. We have therefore elected to continue apixaban. Discontinue aspirin. We can reconsider anticoagulation in the future if he becomes more unsteady.   2 dementia-significant. Management per primary care.  3 history of tachybradycardia syndrome-will avoid AV nodal blocking agents for now.  Olga Millers, MD

## 2016-05-13 ENCOUNTER — Encounter: Payer: Self-pay | Admitting: Cardiology

## 2016-05-13 ENCOUNTER — Ambulatory Visit (INDEPENDENT_AMBULATORY_CARE_PROVIDER_SITE_OTHER): Payer: Medicare Other | Admitting: Cardiology

## 2016-05-13 VITALS — BP 124/64 | HR 64 | Ht 72.0 in | Wt 181.0 lb

## 2016-05-13 DIAGNOSIS — I4891 Unspecified atrial fibrillation: Secondary | ICD-10-CM

## 2016-05-13 NOTE — Patient Instructions (Signed)
Medication Instructions:   NO CHANGE  Labwork:  Your physician recommends that you return for lab work in: 4 WEEKS  Follow-Up:  Your physician recommends that you schedule a follow-up appointment in: 3 MONTHS WITH DR Jens SomRENSHAW

## 2016-06-11 ENCOUNTER — Telehealth: Payer: Self-pay | Admitting: Cardiology

## 2016-06-11 NOTE — Telephone Encounter (Signed)
error 

## 2016-06-12 ENCOUNTER — Other Ambulatory Visit (INDEPENDENT_AMBULATORY_CARE_PROVIDER_SITE_OTHER): Payer: Medicare Other

## 2016-06-12 DIAGNOSIS — I4891 Unspecified atrial fibrillation: Secondary | ICD-10-CM

## 2016-06-12 LAB — CBC
HCT: 33.6 % — ABNORMAL LOW (ref 38.5–50.0)
Hemoglobin: 10.9 g/dL — ABNORMAL LOW (ref 13.2–17.1)
MCH: 28.3 pg (ref 27.0–33.0)
MCHC: 32.4 g/dL (ref 32.0–36.0)
MCV: 87.3 fL (ref 80.0–100.0)
MPV: 8.9 fL (ref 7.5–12.5)
PLATELETS: 231 10*3/uL (ref 140–400)
RBC: 3.85 MIL/uL — AB (ref 4.20–5.80)
RDW: 15.2 % — ABNORMAL HIGH (ref 11.0–15.0)
WBC: 5.5 10*3/uL (ref 3.8–10.8)

## 2016-06-13 LAB — BASIC METABOLIC PANEL
BUN: 20 mg/dL (ref 7–25)
CALCIUM: 9.2 mg/dL (ref 8.6–10.3)
CO2: 25 mmol/L (ref 20–31)
CREATININE: 1.55 mg/dL — AB (ref 0.70–1.11)
Chloride: 106 mmol/L (ref 98–110)
Glucose, Bld: 89 mg/dL (ref 65–99)
Potassium: 4.2 mmol/L (ref 3.5–5.3)
Sodium: 143 mmol/L (ref 135–146)

## 2016-06-16 ENCOUNTER — Telehealth: Payer: Self-pay | Admitting: *Deleted

## 2016-06-16 MED ORDER — APIXABAN 2.5 MG PO TABS: 2.5000 mg | ORAL_TABLET | Freq: Two times a day (BID) | ORAL | 3 refills | Status: AC

## 2016-06-16 NOTE — Telephone Encounter (Signed)
Spoke with pt wife, aware of medication change. New script sent to the pharmacy  

## 2016-06-16 NOTE — Telephone Encounter (Signed)
-----   Message from Lewayne BuntingBrian S Crenshaw, MD sent at 06/13/2016  6:22 AM EDT ----- Change eliquis to 2.5 mg bid Olga MillersBrian Crenshaw

## 2016-07-12 NOTE — Progress Notes (Signed)
PATIENT: Cole Wilson DOB: 09-15-1931  REASON FOR VISIT: follow up- memory HISTORY FROM: patient  HISTORY OF PRESENT ILLNESS: Cole Wilson is an 82 year old male with a history of progressive memory disturbance. He returns today for follow-up. Over the last 6 months the patient has had several health issues. From April to May he was in and out of [facility] for urinary tract infection associated with disorientation and confusion. During this time he also went to [facility] for severe urinary tract infection. In July he had a urinary tract infection and was diagnosed with A. fib. Also in July he was hospitalized at [facility] for pancreatitis and had his gallbladder removed. His wife states that after his surgery in July there was a significant decline in his memory after anesthesia. She states unfortunately he has not regained much of his memory back. He is now at [facility] care unit. His wife lives in the independent living facility. He is able to complete most ADLs independently. He eats all of his meals there. He does not operate a motor vehicle. His hallucinations are controlled. His wife states that initially after his hospitalization they had increased. His Seroquel was increased to 75 mg at bedtime. She reports that he is still up and down at night. Wife states that she also takes him out regularly. He does well out in public. He did have a fall approximately 3 weeks ago. States that it was raining and he slipped on leaves falling and hitting his right hip. Fortunately he did not have a fracture although he has a bruise and significant discomfort. He returns today for an evaluation.  HISTORY: Cole Wilson is an 82 year old right-handed white male with a history of a progressive memory disturbance. The patient no longer is operating a motor vehicle. He had an episode of significant confusion and agitation in the fall of 2016, he was placed on Seroquel at night  which seemed to help significantly. He was wandering at night, and he had agitated behavior. He was admitted to the hospital in November 2016 with a bout of pancreatitis. The patient is no longer drinking alcohol at this time. He is on Aricept and Namenda, tolerating the medications well. No other new medical issues have come up since last seen. The wife does take care of the medications and appointments for the patient.   REVIEW OF SYSTEMS: Out of a complete 14 system review of symptoms, the patient complains only of the following symptoms, and all other reviewed systems are negative.  Bruise/bleed easily, memory loss, confusion, decreased concentration, hallucinations, wounds, acting out dreams, frequent waking, runny nose, activity change  ALLERGIES: No Known Allergies  HOME MEDICATIONS: Outpatient Medications Prior to Visit  Medication Sig Dispense Refill  . apixaban (ELIQUIS) 2.5 MG TABS tablet Take 1 tablet (2.5 mg total) by mouth 2 (two) times daily. 180 tablet 3  . Ascorbic Acid (VITAMIN C) 1000 MG tablet Take 1,000 mg by mouth daily.    [name] aspirin 81 MG tablet Take 81 mg by mouth daily.    . Calcium Carb-Cholecalciferol (CALCIUM 600+D) 600-800 MG-UNIT TABS Take 1 tablet by mouth daily.    [name] donepezil (ARICEPT) 10 MG tablet Take 1 tablet (10 mg total) by mouth at bedtime. 90 tablet 3  . feeding supplement, ENSURE ENLIVE, (ENSURE ENLIVE) LIQD Take 237 mLs by mouth 2 (two) times daily between meals. 237 mL 12  . ferrous sulfate 325 (65 FE) MG EC tablet Take 325 mg  by mouth daily.    . Glucosamine-Chondroit-Vit C-Mn (GLUCOSAMINE 1500 COMPLEX PO) Take 1 tablet by mouth daily.    . memantine (NAMENDA) 10 MG tablet TAKE 1 TABLET BY MOUTH TWICE DAILY 60 tablet 11  . Multiple Vitamin (MULTIVITAMIN) capsule Take 1 capsule by mouth daily.      . Multiple Vitamins-Minerals (VITEYES AREDS FORMULA) CAPS Take 1 capsule by mouth daily.     . QUEtiapine (SEROQUEL) 50 MG tablet Take by mouth at  bedtime. Take 75 mg by mouth once daily    . tamsulosin (FLOMAX) 0.4 MG CAPS capsule Take 1 capsule (0.4 mg total) by mouth at bedtime. 30 capsule   . TRIMETHOPRIM PO Take 100 mg by mouth at bedtime.      No facility-administered medications prior to visit.     PAST MEDICAL HISTORY: Past Medical History:  Diagnosis Date  . Atrial fibrillation (HCC)   . CKD (chronic kidney disease), stage III   . Dementia   . Depression   . Pancreatitis     PAST SURGICAL HISTORY: Past Surgical History:  Procedure Laterality Date  . ANKLE FUSION Right 1970/1990  . APPENDECTOMY    . CHOLECYSTECTOMY N/A 03/16/2016   Procedure: LAPAROSCOPIC CHOLECYSTECTOMY WITH ATTEMPTED INTRAOPERATIVE CHOLANGIOGRAM;  Surgeon: Jimmye NormanJames Wyatt, MD;  [facility];  Service: General;  Laterality: N/A;  . KNEE ARTHROSCOPY Left 2002  . TONSILLECTOMY  1952  . TOTAL KNEE ARTHROPLASTY Left 2009    FAMILY HISTORY: Family History  Problem Relation Age of Onset  . Cancer Mother     breast, uterine  . Cancer - Lung Father   . Dementia Paternal Grandmother     SOCIAL HISTORY: Social History   Social History  . Marital status: Married    Spouse name: N/A  . Number of children: 3  . Years of education: MA   Occupational History  . retired    Social History Main Topics  . Smoking status: Never Smoker  . Smokeless tobacco: Never Used  . Alcohol use No  . Drug use: No  . Sexual activity: Not on file   Other Topics Concern  . Not on file   Social History Narrative   Patient lives at home with his wife [name](Suzanne) They both live at [place].   Retired.   Education Masters.   Right handed.   Caffeine one cup of tea daily.       Quit alcohol after pancreatitis x 2 last yr.  2016      PHYSICAL EXAM  Vitals:   07/13/16 1044  BP: (!) 125/54  Pulse: 71  Weight: 188 lb 9.6 oz (85.5 kg)  Height: 6' (1.829 m)   Body mass index is 25.58 kg/m.   MMSE - Mini Mental State Exam 07/13/2016 11/11/2015 05/08/2015   Orientation to time 0 2 3  Orientation to Place 0 5 5  Registration 3 3 3   Attention/ Calculation 2 3 5   Recall 0 1 3  Language- name 2 objects 1 2 2   Language- repeat 1 1 1   Language- follow 3 step command 3 3 2   Language- read & follow direction 1 1 1   Write a sentence 1 1 1   Copy design 0 1 1  Total score 12 23 27      Generalized: Well developed, in no acute distress   Neurological examination  Mentation: Alert. Follows all commands speech and language fluent Cranial nerve II-XII: Pupils were equal round reactive to light. Extraocular movements were full, visual field  were full on confrontational test. Facial sensation and strength were normal. Uvula tongue midline. Head turning and shoulder shrug  were normal and symmetric. Motor: The motor testing reveals 5 over 5 strength of all 4 extremities. Good symmetric motor tone is noted throughout.  Sensory: Sensory testing is intact to soft touch on all 4 extremities. No evidence of extinction is noted.  Coordination: Cerebellar testing reveals good finger-nose-finger and heel-to-shin bilaterally. He does have discomfort on the right due to hip pain. Gait and station: Patient has difficulty standing due to pain in the right hip. Gait is normal. Tandem gait not attended. Reflexes: Deep tendon reflexes are symmetric and normal bilaterally.   DIAGNOSTIC DATA (LABS, IMAGING, TESTING) - I reviewed patient records, labs, notes, testing and imaging myself where available.  Lab Results  Component Value Date   WBC 5.5 06/12/2016   HGB 10.9 (L) 06/12/2016   HCT 33.6 (L) 06/12/2016   MCV 87.3 06/12/2016   PLT 231 06/12/2016      Component Value Date/Time   NA 143 06/12/2016 1529   K 4.2 06/12/2016 1529   CL 106 06/12/2016 1529   CO2 25 06/12/2016 1529   GLUCOSE 89 06/12/2016 1529   BUN 20 06/12/2016 1529   CREATININE 1.55 (H) 06/12/2016 1529   CALCIUM 9.2 06/12/2016 1529   PROT 5.5 (L) 03/16/2016 0319   ALBUMIN 2.9 (L) 03/16/2016  0319   AST 26 03/16/2016 0319   ALT 20 03/16/2016 0319   ALKPHOS 83 03/16/2016 0319   BILITOT 0.7 03/16/2016 0319   GFRNONAA >60 03/18/2016 0438   GFRAA >60 03/18/2016 0438   Lab Results  Component Value Date   CHOL 190 03/12/2016   HDL 63 03/12/2016   LDLCALC 113 (H) 03/12/2016   TRIG 70 03/12/2016   CHOLHDL 3.0 03/12/2016   Lab Results  Component Value Date   HGBA1C 5.3 03/12/2016   Lab Results  Component Value Date   VITAMINB12 704 03/17/2016   Lab Results  Component Value Date   TSH 2.556 03/12/2016      ASSESSMENT AND PLAN 82 y.o. year old male  has a past medical history of Atrial fibrillation (HCC); CKD (chronic kidney disease), stage III; Dementia; Depression; and Pancreatitis. here with:  1. Dementia  The patient's memory has declined since last visit. Wife states that much of this decline occurred after his hospitalization in July. For now he will remain on Aricept and Namenda. Advised that if his symptoms worsen or he develops any new symptoms he should let us know. He will follow-up in 6 months or sooner if needed     Butch PennyMegan Cataldo Cosgriff, MSN, NP-C 07/12/2016, 8:13 PM [facility] [address], [address] [place], [place] [address] [phone]

## 2016-07-13 ENCOUNTER — Encounter: Payer: Self-pay | Admitting: Adult Health

## 2016-07-13 ENCOUNTER — Ambulatory Visit (INDEPENDENT_AMBULATORY_CARE_PROVIDER_SITE_OTHER): Payer: Medicare Other | Admitting: Adult Health

## 2016-07-13 VITALS — BP 125/54 | HR 71 | Ht 72.0 in | Wt 188.6 lb

## 2016-07-13 DIAGNOSIS — F0391 Unspecified dementia with behavioral disturbance: Secondary | ICD-10-CM

## 2016-07-13 NOTE — Progress Notes (Signed)
I have read the note, and I agree with the clinical assessment and plan.  WILLIS,CHARLES KEITH   

## 2016-07-13 NOTE — Patient Instructions (Signed)
Memory has declined Continue aricept and namenda If your symptoms worsen or you develop new symptoms please let us know.

## 2016-07-23 ENCOUNTER — Encounter: Payer: Self-pay | Admitting: Cardiology

## 2016-07-23 NOTE — Progress Notes (Deleted)
HPI: FU atrial fibrillation. Patient apparently had atrial fibrillation in June 2017 at [facility] when he was hospitalized with urosepsis and hypotension. He was not treated with anticoagulation. During [facility]  in July he was started on apixaban. Echocardiogram 03/12/2016 showed normal LV function, grade 1 diastolic dysfunction, mild aortic stenosis with mean gradient 10 mmHg and mildly dilated ascending aorta. Laboratories July 2017 showed normal TSH. Patient has significant dementia. However his wife states he does not fall. He resides in a memory care unit. At last office visit she was clear she wanted him on anticoagulation to decrease risk of CVA. Since last seen,   Current Outpatient Prescriptions  Medication Sig Dispense Refill  . apixaban (ELIQUIS) 2.5 MG TABS tablet Take 1 tablet (2.5 mg total) by mouth 2 (two) times daily. 180 tablet 3  . Ascorbic Acid (VITAMIN C) 1000 MG tablet Take 1,000 mg by mouth daily.    . Calcium Carb-Cholecalciferol (CALCIUM 600+D) 600-800 MG-UNIT TABS Take 1 tablet by mouth daily.    [name]. donepezil (ARICEPT) 10 MG tablet Take 1 tablet (10 mg total) by mouth at bedtime. 90 tablet 3  . ferrous sulfate 325 (65 FE) MG EC tablet Take 325 mg by mouth daily.    . Glucosamine-Chondroit-Vit C-Mn (GLUCOSAMINE 1500 COMPLEX PO) Take 1 tablet by mouth daily.    . memantine (NAMENDA) 10 MG tablet TAKE 1 TABLET BY MOUTH TWICE DAILY 60 tablet 11  . Multiple Vitamin (MULTIVITAMIN) capsule Take 1 capsule by mouth daily.      . Multiple Vitamins-Minerals (VITEYES AREDS FORMULA) CAPS Take 1 capsule by mouth daily.     . QUEtiapine (SEROQUEL) 50 MG tablet Take by mouth at bedtime. Take 75 mg by mouth once daily    . tamsulosin (FLOMAX) 0.4 MG CAPS capsule Take 1 capsule (0.4 mg total) by mouth at bedtime. 30 capsule   . TRIMETHOPRIM PO Take 100 mg by mouth at bedtime.      No current facility-administered medications for this visit.       Past Medical History:  Diagnosis Date  . Atrial fibrillation (HCC)   . CKD (chronic kidney disease), stage III   . Dementia   . Depression   . Pancreatitis     Past Surgical History:  Procedure Laterality Date  . ANKLE FUSION Right 1970/1990  . APPENDECTOMY    . CHOLECYSTECTOMY N/A 03/16/2016   Procedure: LAPAROSCOPIC CHOLECYSTECTOMY WITH ATTEMPTED INTRAOPERATIVE CHOLANGIOGRAM;  Surgeon: Jimmye NormanJames Wyatt, MD;  [facility];  Service: General;  Laterality: N/A;  . KNEE ARTHROSCOPY Left 2002  . TONSILLECTOMY  1952  . TOTAL KNEE ARTHROPLASTY Left 2009    Social History   Social History  . Marital status: Married    Spouse name: N/A  . Number of children: 3  . Years of education: MA   Occupational History  . retired    Social History Main Topics  . Smoking status: Never Smoker  . Smokeless tobacco: Never Used  . Alcohol use No  . Drug use: No  . Sexual activity: Not on file   Other Topics Concern  . Not on file   Social History Narrative   Patient lives at home with his wife [name](Suzanne) They both live at [place]. (Memory Care for pt 04-24-16).   Retired.   Education Masters.   Right handed.   Caffeine one cup of tea daily.       Quit alcohol after pancreatitis x 2 last  yr.  2016    Family History  Problem Relation Age of Onset  . Cancer Mother     breast, uterine  . Cancer - Lung Father   . Dementia Paternal Grandmother     ROS: no fevers or chills, productive cough, hemoptysis, dysphasia, odynophagia, melena, hematochezia, dysuria, hematuria, rash, seizure activity, orthopnea, PND, pedal edema, claudication. Remaining systems are negative.  Physical Exam: Well-developed well-nourished in no acute distress.  Skin is warm and dry.  HEENT is normal.  Neck is supple.  Chest is clear to auscultation with normal expansion.  Cardiovascular exam is regular rate and rhythm.  Abdominal exam nontender or distended. No masses palpated. Extremities show no  edema. neuro grossly intact  ECG

## 2016-07-29 ENCOUNTER — Ambulatory Visit: Payer: Medicare Other | Admitting: Cardiology

## 2016-08-31 NOTE — Progress Notes (Signed)
HPI: FU atrial fibrillation. Abdominal MR December 2016 showed no aneurysm. Patient apparently had atrial fibrillation in June 2017 at [facility] when he was hospitalized with urosepsis and hypotension. He was not treated with anticoagulation. During hospitalization [facility] in July he was started on apixaban. Echocardiogram 03/12/2016 showed normal LV function, grade 1 diastolic dysfunction, mild aortic stenosis with mean gradient 10 mmHg and mildly dilated ascending aorta. Laboratories July 2017 showed normal TSH. Patient has significant dementia. However his wife stated he does not fall and resides in a memory care unit. She elected to continue anticoagulation at last ov. Since last seen, the patient denies any dyspnea on exertion, orthopnea, PND, pedal edema, palpitations, syncope or chest pain.   Current Outpatient Prescriptions  Medication Sig Dispense Refill  . apixaban (ELIQUIS) 2.5 MG TABS tablet Take 1 tablet (2.5 mg total) by mouth 2 (two) times daily. 180 tablet 3  . Ascorbic Acid (VITAMIN C) 1000 MG tablet Take 1,000 mg by mouth daily.    . Calcium Carb-Cholecalciferol (CALCIUM 600+D) 600-800 MG-UNIT TABS Take 1 tablet by mouth daily.    [name]. donepezil (ARICEPT) 10 MG tablet Take 1 tablet (10 mg total) by mouth at bedtime. 90 tablet 3  . ferrous sulfate 325 (65 FE) MG EC tablet Take 325 mg by mouth daily.    . Glucosamine-Chondroit-Vit C-Mn (GLUCOSAMINE 1500 COMPLEX PO) Take 1 tablet by mouth daily.    . memantine (NAMENDA) 10 MG tablet TAKE 1 TABLET BY MOUTH TWICE DAILY 60 tablet 11  . Multiple Vitamin (MULTIVITAMIN) capsule Take 1 capsule by mouth daily.      . Multiple Vitamins-Minerals (VITEYES AREDS FORMULA) CAPS Take 1 capsule by mouth daily.     . QUEtiapine (SEROQUEL) 50 MG tablet Take by mouth at bedtime. Take 75 mg by mouth once daily    . tamsulosin (FLOMAX) 0.4 MG CAPS capsule Take 1 capsule (0.4 mg total) by mouth at bedtime. 30 capsule   .  TRIMETHOPRIM PO Take 100 mg by mouth at bedtime.      No current facility-administered medications for this visit.      Past Medical History:  Diagnosis Date  . Atrial fibrillation (HCC)   . CKD (chronic kidney disease), stage III   . Dementia   . Depression   . Pancreatitis     Past Surgical History:  Procedure Laterality Date  . ANKLE FUSION Right 1970/1990  . APPENDECTOMY    . CHOLECYSTECTOMY N/A 03/16/2016   Procedure: LAPAROSCOPIC CHOLECYSTECTOMY WITH ATTEMPTED INTRAOPERATIVE CHOLANGIOGRAM;  Surgeon: Jimmye NormanJames Wyatt, MD;  [facility];  Service: General;  Laterality: N/A;  . KNEE ARTHROSCOPY Left 2002  . TONSILLECTOMY  1952  . TOTAL KNEE ARTHROPLASTY Left 2009    Social History   Social History  . Marital status: Married    Spouse name: N/A  . Number of children: 3  . Years of education: MA   Occupational History  . retired    Social History Main Topics  . Smoking status: Never Smoker  . Smokeless tobacco: Never Used  . Alcohol use No  . Drug use: No  . Sexual activity: Not on file   Other Topics Concern  . Not on file   Social History Narrative   Patient lives at home with his wife [name](Suzanne) They both live at [facility]. (Memory Care for pt 04-24-16).   Retired.   Education Masters.   Right handed.   Caffeine one cup of tea  daily.       Quit alcohol after pancreatitis x 2 last yr.  2016    Family History  Problem Relation Age of Onset  . Cancer Mother     breast, uterine  . Cancer - Lung Father   . Dementia Paternal Grandmother     ROS: no fevers or chills, productive cough, hemoptysis, dysphasia, odynophagia, melena, hematochezia, dysuria, hematuria, rash, seizure activity, orthopnea, PND, pedal edema, claudication. Remaining systems are negative.  Physical Exam: Well-developed well-nourished in no acute distress.  Skin is warm and dry.  HEENT is normal.  Neck is supple.  Chest is clear to auscultation with normal expansion.  Cardiovascular  exam is regular rate and rhythm. 2/6 systolic murmur left sternal border. Abdominal exam nontender or distended. No masses palpated. Extremities show trace edema. neuro grossly intact; problems with memory   A/P  1 Paroxysmal atrial fibrillation-patient remains in sinus rhythm on examination. There is some risk with anticoagulation given his mental status. However his wife stated previously that he is in a protected environment in the memory care unit and she would like to avoid CVA at all cost. Continue apixaban. Dosed at 2.5 mg BID given age greater than 80 and Cr 1.55. Recheck hemoglobin and renal function at next office visit.  2 history of tachybradycardia syndrome-avoid AV nodal blocking agents. I would like to avoid pacemaker.  3 dementia-significant. Primary care.  Olga Millers, MD

## 2016-09-09 ENCOUNTER — Ambulatory Visit (INDEPENDENT_AMBULATORY_CARE_PROVIDER_SITE_OTHER): Payer: Medicare Other | Admitting: Cardiology

## 2016-09-09 ENCOUNTER — Encounter: Payer: Self-pay | Admitting: Cardiology

## 2016-09-09 VITALS — BP 119/76 | HR 58 | Ht 72.0 in | Wt 188.8 lb

## 2016-09-09 DIAGNOSIS — I480 Paroxysmal atrial fibrillation: Secondary | ICD-10-CM

## 2016-09-09 NOTE — Patient Instructions (Signed)
Your physician wants you to follow-up in: 6 MONTHS WITH DR CRENSHAW You will receive a reminder letter in the mail two months in advance. If you don't receive a letter, please call our office to schedule the follow-up appointment.   If you need a refill on your cardiac medications before your next appointment, please call your pharmacy.  

## 2016-11-07 ENCOUNTER — Other Ambulatory Visit: Payer: Self-pay | Admitting: Neurology

## 2016-12-18 ENCOUNTER — Other Ambulatory Visit: Payer: Self-pay | Admitting: Neurology

## 2016-12-28 ENCOUNTER — Encounter: Payer: Self-pay | Admitting: Adult Health

## 2016-12-28 ENCOUNTER — Ambulatory Visit (INDEPENDENT_AMBULATORY_CARE_PROVIDER_SITE_OTHER): Payer: Medicare Other | Admitting: Adult Health

## 2016-12-28 VITALS — BP 135/57 | HR 52 | Ht 72.0 in | Wt 183.0 lb

## 2016-12-28 DIAGNOSIS — R413 Other amnesia: Secondary | ICD-10-CM | POA: Diagnosis not present

## 2016-12-28 NOTE — Progress Notes (Signed)
I have read the note, and I agree with the clinical assessment and plan.  WILLIS,CHARLES KEITH   

## 2016-12-28 NOTE — Progress Notes (Signed)
PATIENT: Cole Wilson DOB: 21-Jul-1932  REASON FOR VISIT: follow up- memory  HISTORY FROM: patient  HISTORY OF PRESENT ILLNESS: Cole Wilson is an 83 year old male with a history of progressive memory disturbance. He returns today for follow-up. He is currently in a memory facility. His wife reports that he is able to dress and bathe himself. He does have some difficulty tying his shoes. He does not operate a motor vehicle. He is able to feed himself. Wife states that he has a hard time operating the TV remote and telephone. Denies any trouble sleeping. Wife reports that he sometimes has some agitation but denies aggressiveness. She reports confusion off and on. In the past he has had trouble with chronic urinary tract infection however now his urologist has placed him on a daily antibiotic. According to the wife this seems to have helped. The patient remains on Aricept and Namenda. She reports at the facility they do memory testing twice a year. She is wondering if it's okay for him to just follow up there instead of coming to our office. She returns today for an evaluation  07/13/16: Cole Wilson is an 83 year old male with a history of progressive memory disturbance. He returns today for follow-up. Over the last 6 months the patient has had several health issues. From April to May he was in and out of [facility] for urinary tract infection associated with disorientation and confusion. During this time he also went to [facility] for severe urinary tract infection. In July he had a urinary tract infection and was diagnosed with A. fib. Also in July he was hospitalized at [facility] for pancreatitis and had his gallbladder removed. His wife states that after his surgery in July there was a significant decline in his memory after anesthesia. She states unfortunately he has not regained much of his memory back. He is now at [facility] care unit. His wife lives in the  independent living facility. He is able to complete most ADLs independently. He eats all of his meals there. He does not operate a motor vehicle. His hallucinations are controlled. His wife states that initially after his hospitalization they had increased. His Seroquel was increased to 75 mg at bedtime. She reports that he is still up and down at night. Wife states that she also takes him out regularly. He does well out in public. He did have a fall approximately 3 weeks ago. States that it was raining and he slipped on leaves falling and hitting his right hip. Fortunately he did not have a fracture although he has a bruise and significant discomfort. He returns today for an evaluation.   REVIEW OF SYSTEMS: Out of a complete 14 system review of symptoms, the patient complains only of the following symptoms, and all other reviewed systems are negative.  Memory loss, confusion  ALLERGIES: No Known Allergies  HOME MEDICATIONS: Outpatient Medications Prior to Visit  Medication Sig Dispense Refill  . apixaban (ELIQUIS) 2.5 MG TABS tablet Take 1 tablet (2.5 mg total) by mouth 2 (two) times daily. 180 tablet 3  . Ascorbic Acid (VITAMIN C) 1000 MG tablet Take 1,000 mg by mouth daily.    . Calcium Carb-Cholecalciferol (CALCIUM 600+D) 600-800 MG-UNIT TABS Take 1 tablet by mouth daily.    [name] donepezil (ARICEPT) 10 MG tablet TAKE 1 TABLET(10 MG) BY MOUTH AT BEDTIME 90 tablet 3  . ferrous sulfate 325 (65 FE) MG EC tablet Take 325 mg by mouth daily.    [name]  Glucosamine-Chondroit-Vit C-Mn (GLUCOSAMINE 1500 COMPLEX PO) Take 1 tablet by mouth daily.    . memantine (NAMENDA) 10 MG tablet TAKE 1 TABLET BY MOUTH TWICE DAILY 60 tablet 11  . Multiple Vitamin (MULTIVITAMIN) capsule Take 1 capsule by mouth daily.      . Multiple Vitamins-Minerals (VITEYES AREDS FORMULA) CAPS Take 1 capsule by mouth daily.     . QUEtiapine (SEROQUEL) 50 MG tablet Take by mouth at bedtime. Take 75 mg by mouth once daily    . tamsulosin  (FLOMAX) 0.4 MG CAPS capsule Take 1 capsule (0.4 mg total) by mouth at bedtime. 30 capsule   . TRIMETHOPRIM PO Take 100 mg by mouth at bedtime.      No facility-administered medications prior to visit.     PAST MEDICAL HISTORY: Past Medical History:  Diagnosis Date  . Atrial fibrillation (HCC)   . CKD (chronic kidney disease), stage III   . Dementia   . Depression   . Pancreatitis     PAST SURGICAL HISTORY: Past Surgical History:  Procedure Laterality Date  . ANKLE FUSION Right 1970/1990  . APPENDECTOMY    . CHOLECYSTECTOMY N/A 03/16/2016   Procedure: LAPAROSCOPIC CHOLECYSTECTOMY WITH ATTEMPTED INTRAOPERATIVE CHOLANGIOGRAM;  Surgeon: Jimmye Norman, MD;  Location[facility] OR;  Service: General;  Laterality: N/A;  . KNEE ARTHROSCOPY Left 2002  . TONSILLECTOMY  1952  . TOTAL KNEE ARTHROPLASTY Left 2009    FAMILY HISTORY: Family History  Problem Relation Age of Onset  . Cancer Mother        breast, uterine  . Cancer - Lung Father   . Dementia Paternal Grandmother     SOCIAL HISTORY: Social History   Social History  . Marital status: Married    Spouse name: N/A  . Number of children: 3  . Years of education: MA   Occupational History  . retired    Social History Main Topics  . Smoking status: Never Smoker  . Smokeless tobacco: Never Used  . Alcohol use No  . Drug use: No  . Sexual activity: Not on file   Other Topics Concern  . Not on file   Social History Narrative   Patient lives at home with his wife [name]) They both live at [place]. (Memory Care for pt 04-24-16).   Retired.   Education Masters.   Right handed.   Caffeine one cup of tea daily.       Quit alcohol after pancreatitis x 2 last yr.  2016      PHYSICAL EXAM  There were no vitals filed for this visit. There is no height or weight on file to calculate BMI.   MMSE - Mini Mental State Exam 12/28/2016 07/13/2016 11/11/2015  Orientation to time 0 0 2  Orientation to Place 3 0 5  Registration  3 3 3   Attention/ Calculation 1 2 3   Recall 0 0 1  Language- name 2 objects 2 1 2   Language- repeat 1 1 1   Language- follow 3 step command 3 3 3   Language- read & follow direction 1 1 1   Write a sentence 1 1 1   Copy design 0 0 1  Total score 15 12 23      Generalized: Well developed, in no acute distress   Neurological examination  Mentation: Alert.. Follows all commands speech and language fluent Cranial nerve II-XII: Pupils were equal round reactive to light. Extraocular movements were full, visual field were full on confrontational test. Facial sensation and strength were normal. Uvula tongue  midline. Head turning and shoulder shrug  were normal and symmetric. Motor: The motor testing reveals 5 over 5 strength of all 4 extremities. Good symmetric motor tone is noted throughout.  Sensory: Sensory testing is intact to soft touch on all 4 extremities. No evidence of extinction is noted.  Coordination: Cerebellar testing reveals good finger-nose-finger and heel-to-shin bilaterally.  Gait and station: Gait is normal.  Reflexes: Deep tendon reflexes are symmetric and normal bilaterally.   DIAGNOSTIC DATA (LABS, IMAGING, TESTING) - I reviewed patient records, labs, notes, testing and imaging myself where available.  Lab Results  Component Value Date   WBC 5.5 06/12/2016   HGB 10.9 (L) 06/12/2016   HCT 33.6 (L) 06/12/2016   MCV 87.3 06/12/2016   PLT 231 06/12/2016      Component Value Date/Time   NA 143 06/12/2016 1529   K 4.2 06/12/2016 1529   CL 106 06/12/2016 1529   CO2 25 06/12/2016 1529   GLUCOSE 89 06/12/2016 1529   BUN 20 06/12/2016 1529   CREATININE 1.55 (H) 06/12/2016 1529   CALCIUM 9.2 06/12/2016 1529   PROT 5.5 (L) 03/16/2016 0319   ALBUMIN 2.9 (L) 03/16/2016 0319   AST 26 03/16/2016 0319   ALT 20 03/16/2016 0319   ALKPHOS 83 03/16/2016 0319   BILITOT 0.7 03/16/2016 0319   GFRNONAA >60 03/18/2016 0438   GFRAA >60 03/18/2016 0438   Lab Results  Component  Value Date   CHOL 190 03/12/2016   HDL 63 03/12/2016   LDLCALC 113 (H) 03/12/2016   TRIG 70 03/12/2016   CHOLHDL 3.0 03/12/2016   Lab Results  Component Value Date   HGBA1C 5.3 03/12/2016   Lab Results  Component Value Date   VITAMINB12 704 03/17/2016   Lab Results  Component Value Date   TSH 2.556 03/12/2016      ASSESSMENT AND PLAN 83 y.o. year old male  has a past medical history of Atrial fibrillation (HCC); CKD (chronic kidney disease), stage III; Dementia; Depression; and Pancreatitis. here with:  1. Memory disturbance  The patient's memory score has remained stable. He will continue on Aricept and Namenda. I am amenable to him following up with our office on an as-needed basis as long as his primary care or the physician and his facility will continue to prescribe Aricept and Namenda. Patient's wife voiced that she was told they would take over these prescriptions. Patient will follow-up with our office on an as-needed basis.  I spent 15 minutes with the patient 50% of time was spent discussing prognosis with the patient and his wife.     Butch PennyMegan Shauntavia Brackin, MSN, NP-C 12/28/2016, 11:26 AM [facility] [address], [address] [place], [place] [address] [phone]

## 2016-12-28 NOTE — Patient Instructions (Signed)
Continue Aricept and Namenda Ok for PCP to prescribe these and follow up with them

## 2017-03-31 IMAGING — US US ABDOMEN COMPLETE
1 series · 13 of 25 positions shown · non-contrast
Comparison: CT from earlier in the same day

CLINICAL DATA: Dull abdominal pain with pancreatitis on recent CT
examination

EXAM:
ABDOMEN ULTRASOUND COMPLETE

[Series 1: us abdomen complete · 0.25mm/px · 13 of 88 slices shown]
[im 1/88]
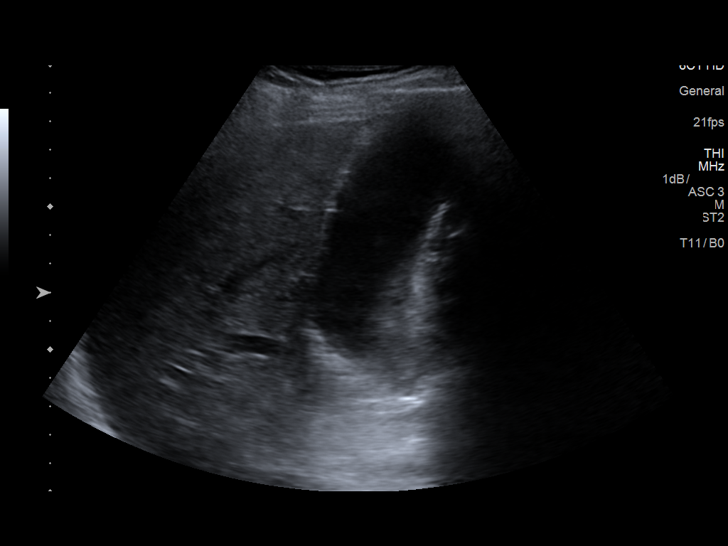
[im 8/88]
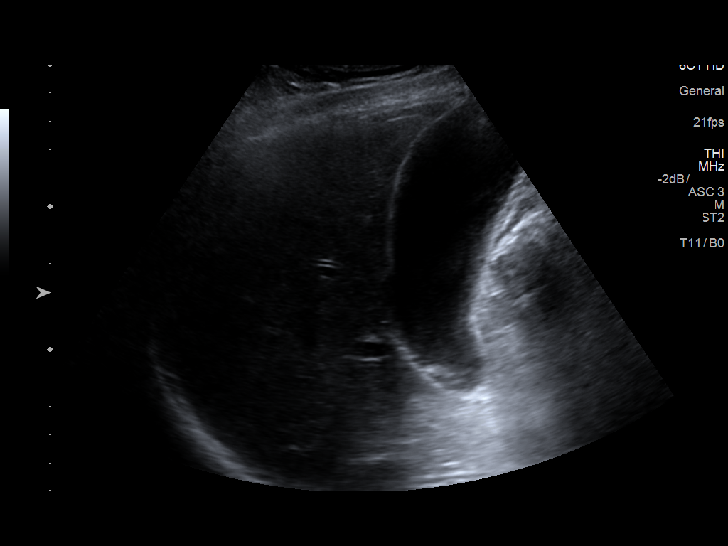
[im 15/88]
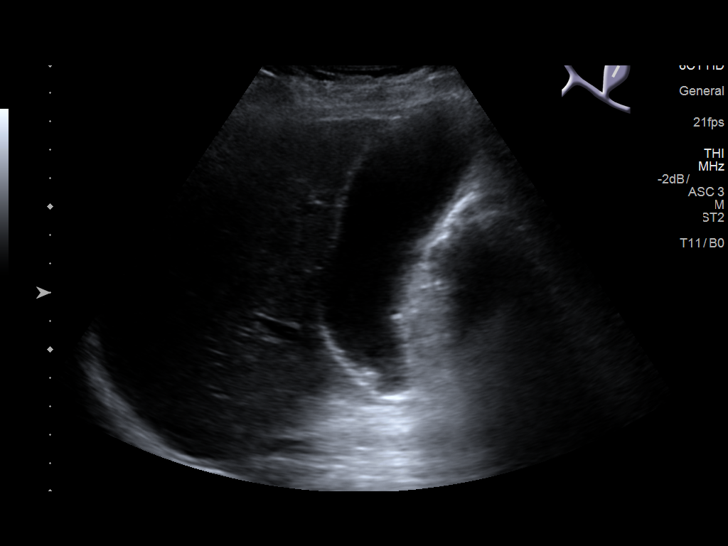
[im 22/88]
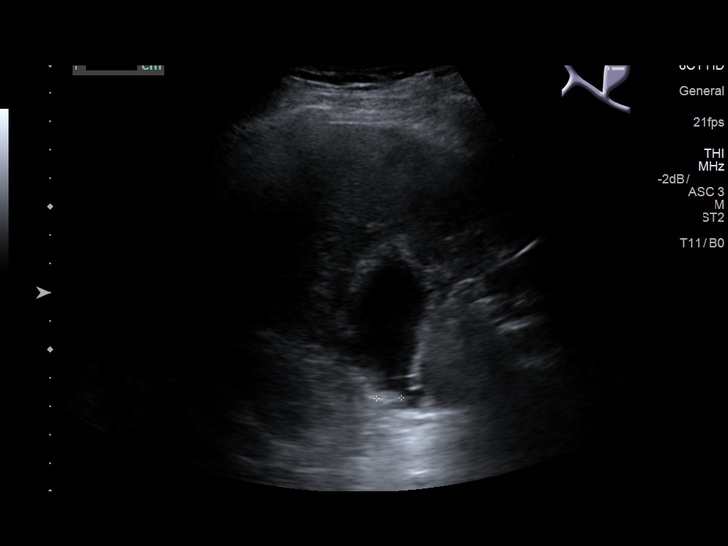
[im 30/88]
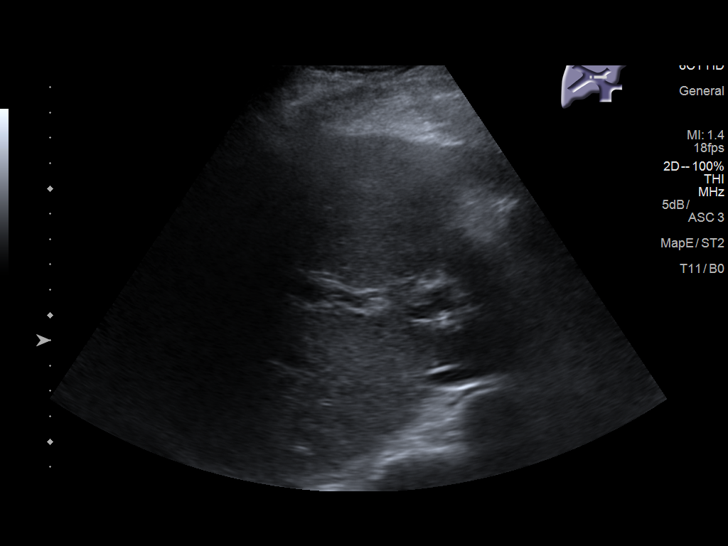
[im 37/88]
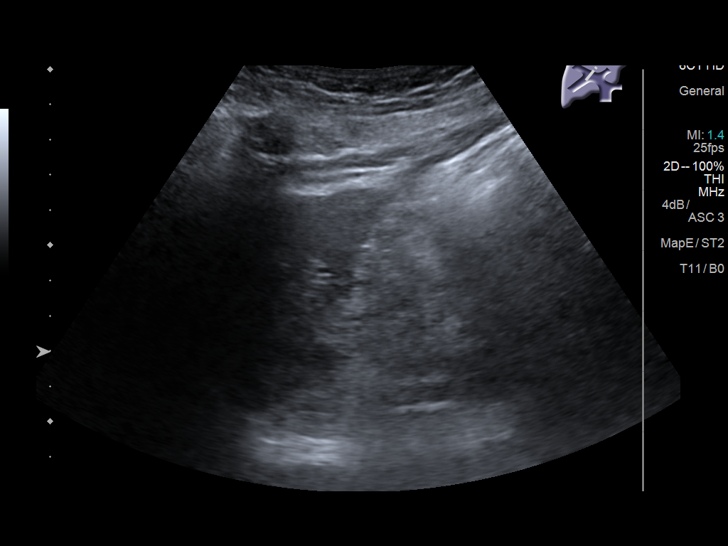
[im 44/88]
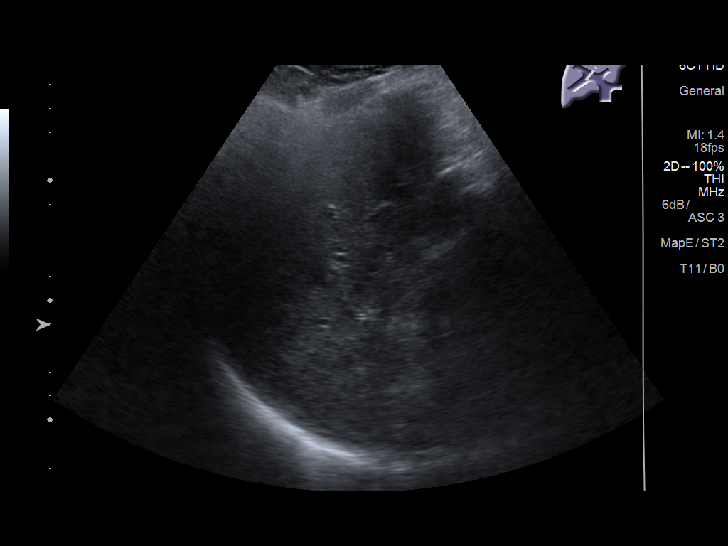
[im 51/88]
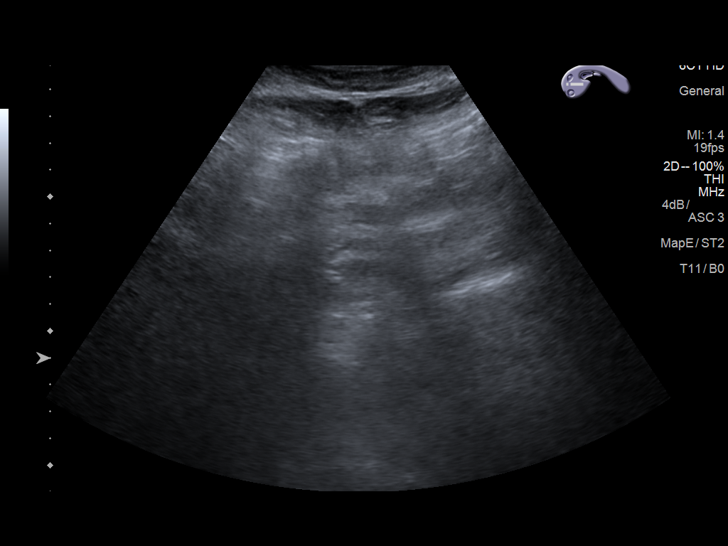
[im 59/88]
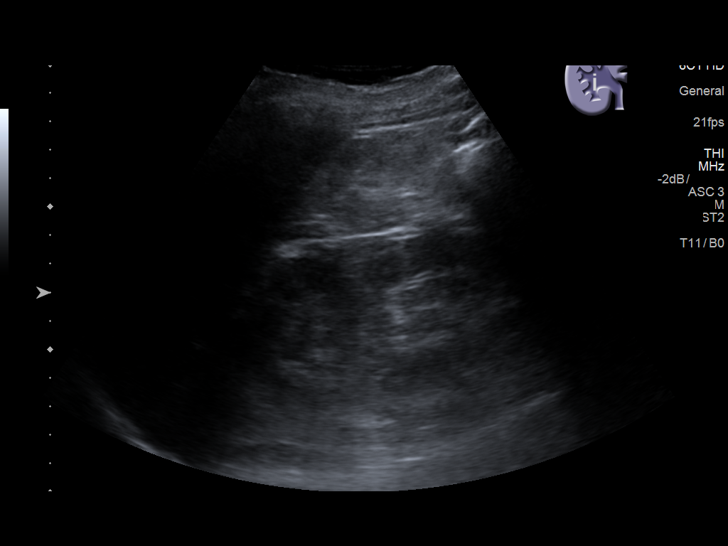
[im 66/88]
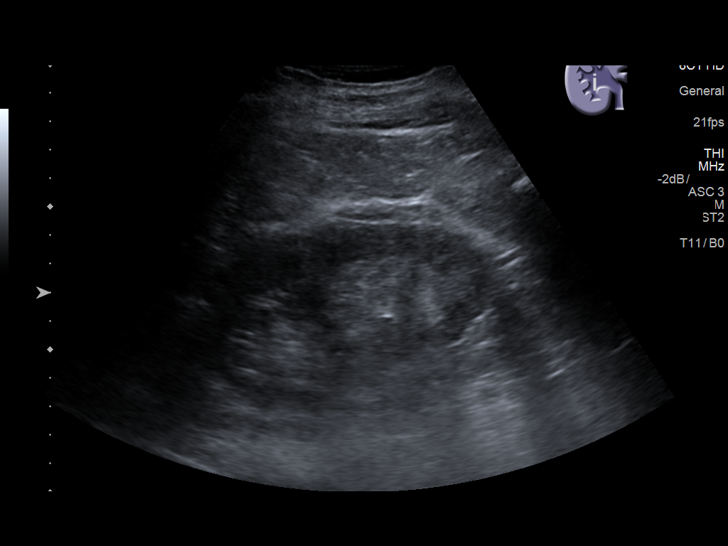
[im 73/88]
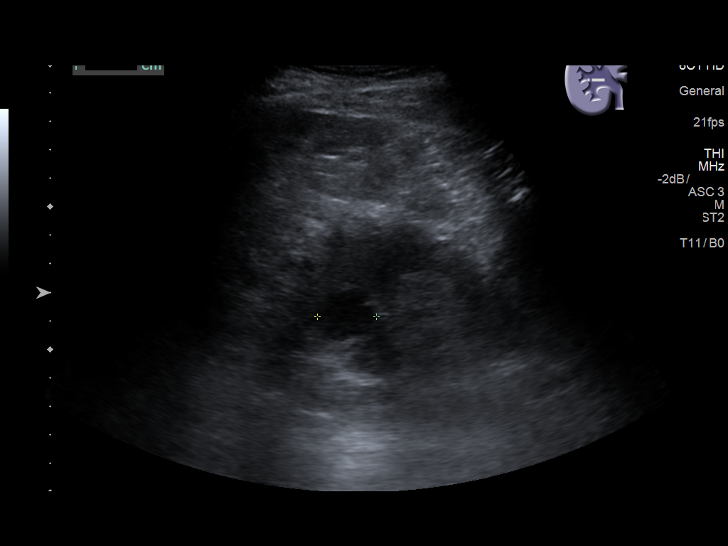
[im 80/88]
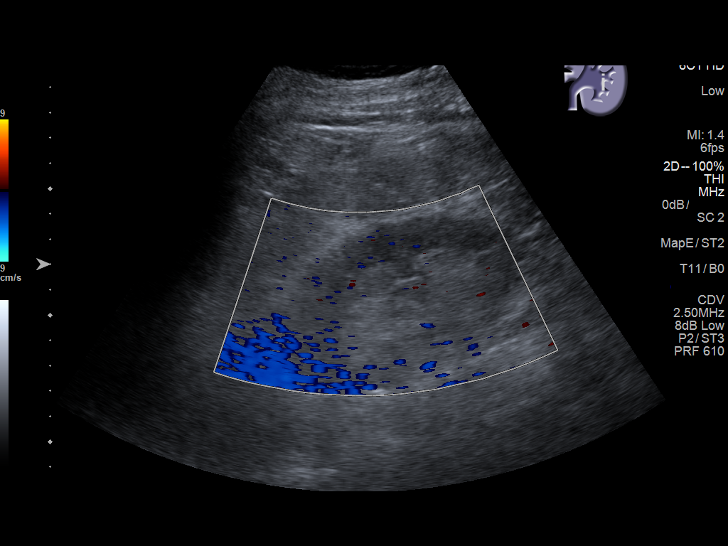
[im 88/88]
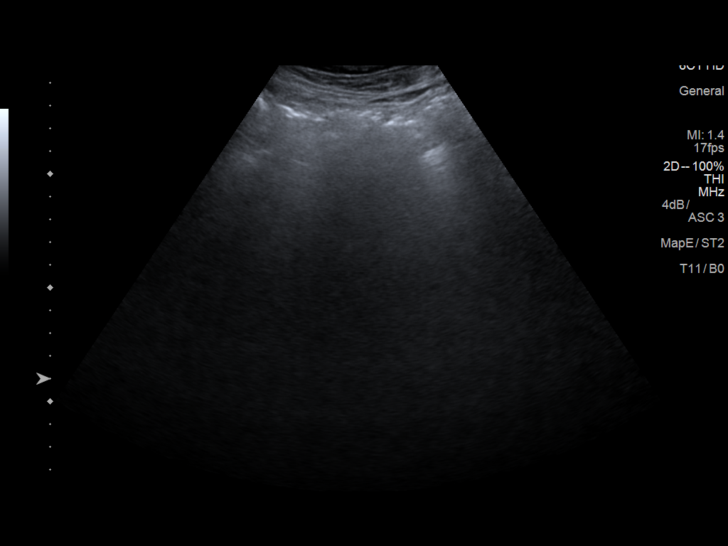

[13 of 25 positions shown; findings below may reference images not displayed]

FINDINGS: Gallbladder: Well distended with mild gallbladder wall thickening to
3.8 mm. Some dependent tiny gallstones are seen. No pericholecystic
fluid is noted.

Common bile duct: Diameter: 5 mm.

Liver: Slight increased echogenicity is noted consistent with fatty
infiltration similar to that seen on recent CT examination. Minimal
biliary ductal dilatation is seen likely related to the underlying
pancreatitis.

IVC: Not well visualized

Pancreas: Not visualized due to overlying bowel gas.

Spleen: Size and appearance within normal limits.

Right Kidney: Length: 10.8 cm.. 1.5 cm cyst is noted similar to that
seen on recent CT examination.

Left Kidney: Length: 11.6 cm.. Echogenicity within normal limits. No
mass or hydronephrosis visualized.

Abdominal aorta: Not visualized due to overlying bowel gas.

Other findings: None.
IMPRESSION: Examination is limited due to overlying bowel gas.

Mild wall thickening with small gallstones.

Fatty liver with mild biliary ductal dilatation. The ductal
dilatation is likely related to the underlying pancreatitis.

## 2017-05-17 ENCOUNTER — Emergency Department (HOSPITAL_COMMUNITY)
Admission: EM | Admit: 2017-05-17 | Discharge: 2017-05-18 | Disposition: A | Discharge status: Home / Self Care | Payer: Medicare Other | Attending: Emergency Medicine | Admitting: Emergency Medicine

## 2017-05-17 ENCOUNTER — Encounter (HOSPITAL_COMMUNITY): Payer: Self-pay | Admitting: Emergency Medicine

## 2017-05-17 ENCOUNTER — Emergency Department (HOSPITAL_COMMUNITY): Payer: Medicare Other

## 2017-05-17 DIAGNOSIS — Y93H2 Activity, gardening and landscaping: Secondary | ICD-10-CM | POA: Diagnosis not present

## 2017-05-17 DIAGNOSIS — S0003XA Contusion of scalp, initial encounter: Secondary | ICD-10-CM | POA: Insufficient documentation

## 2017-05-17 DIAGNOSIS — N3090 Cystitis, unspecified without hematuria: Secondary | ICD-10-CM | POA: Insufficient documentation

## 2017-05-17 DIAGNOSIS — Y92126 Garden or yard of nursing home as the place of occurrence of the external cause: Secondary | ICD-10-CM | POA: Diagnosis not present

## 2017-05-17 DIAGNOSIS — Y999 Unspecified external cause status: Secondary | ICD-10-CM | POA: Insufficient documentation

## 2017-05-17 DIAGNOSIS — T148XXA Other injury of unspecified body region, initial encounter: Secondary | ICD-10-CM | POA: Diagnosis not present

## 2017-05-17 DIAGNOSIS — N183 Chronic kidney disease, stage 3 (moderate): Secondary | ICD-10-CM | POA: Diagnosis not present

## 2017-05-17 DIAGNOSIS — Z7901 Long term (current) use of anticoagulants: Secondary | ICD-10-CM | POA: Diagnosis not present

## 2017-05-17 DIAGNOSIS — Z79899 Other long term (current) drug therapy: Secondary | ICD-10-CM | POA: Diagnosis not present

## 2017-05-17 DIAGNOSIS — W19XXXA Unspecified fall, initial encounter: Secondary | ICD-10-CM

## 2017-05-17 DIAGNOSIS — W010XXA Fall on same level from slipping, tripping and stumbling without subsequent striking against object, initial encounter: Secondary | ICD-10-CM | POA: Insufficient documentation

## 2017-05-17 DIAGNOSIS — F0390 Unspecified dementia without behavioral disturbance: Secondary | ICD-10-CM | POA: Insufficient documentation

## 2017-05-17 HISTORY — DX: Muscle weakness (generalized): M62.81

## 2017-05-17 HISTORY — DX: Hypotension, unspecified: I95.9

## 2017-05-17 LAB — URINALYSIS, ROUTINE W REFLEX MICROSCOPIC
BILIRUBIN URINE: NEGATIVE
Glucose, UA: NEGATIVE mg/dL
Hgb urine dipstick: NEGATIVE
KETONES UR: NEGATIVE mg/dL
Nitrite: NEGATIVE
Protein, ur: NEGATIVE mg/dL
SPECIFIC GRAVITY, URINE: 1.017 (ref 1.005–1.030)
SQUAMOUS EPITHELIAL / LPF: NONE SEEN
pH: 6 (ref 5.0–8.0)

## 2017-05-17 LAB — COMPREHENSIVE METABOLIC PANEL
ALBUMIN: 3.8 g/dL (ref 3.5–5.0)
ALT: 15 U/L — ABNORMAL LOW (ref 17–63)
AST: 25 U/L (ref 15–41)
Alkaline Phosphatase: 149 U/L — ABNORMAL HIGH (ref 38–126)
Anion gap: 8 (ref 5–15)
BILIRUBIN TOTAL: 0.3 mg/dL (ref 0.3–1.2)
BUN: 29 mg/dL — AB (ref 6–20)
CHLORIDE: 107 mmol/L (ref 101–111)
CO2: 27 mmol/L (ref 22–32)
CREATININE: 1.42 mg/dL — AB (ref 0.61–1.24)
Calcium: 8.9 mg/dL (ref 8.9–10.3)
GFR calc Af Amer: 52 mL/min — ABNORMAL LOW (ref >60)
GFR calc non Af Amer: 44 mL/min — ABNORMAL LOW (ref >60)
GLUCOSE: 101 mg/dL — AB (ref 65–99)
POTASSIUM: 4.2 mmol/L (ref 3.5–5.1)
Sodium: 142 mmol/L (ref 135–145)
Total Protein: 6.9 g/dL (ref 6.5–8.1)

## 2017-05-17 LAB — CBC WITH DIFFERENTIAL/PLATELET
BASOS ABS: 0 10*3/uL (ref 0.0–0.1)
Basophils Relative: 0 %
EOS PCT: 1 %
Eosinophils Absolute: 0.1 10*3/uL (ref 0.0–0.7)
HEMATOCRIT: 30.8 % — AB (ref 39.0–52.0)
Hemoglobin: 9.9 g/dL — ABNORMAL LOW (ref 13.0–17.0)
LYMPHS ABS: 1.3 10*3/uL (ref 0.7–4.0)
LYMPHS PCT: 20 %
MCH: 29.1 pg (ref 26.0–34.0)
MCHC: 32.1 g/dL (ref 30.0–36.0)
MCV: 90.6 fL (ref 78.0–100.0)
MONO ABS: 0.5 10*3/uL (ref 0.1–1.0)
Monocytes Relative: 7 %
Neutro Abs: 4.7 10*3/uL (ref 1.7–7.7)
Neutrophils Relative %: 72 %
Platelets: 211 10*3/uL (ref 150–400)
RBC: 3.4 MIL/uL — ABNORMAL LOW (ref 4.22–5.81)
RDW: 14.4 % (ref 11.5–15.5)
WBC: 6.5 10*3/uL (ref 4.0–10.5)

## 2017-05-17 MED ORDER — CEPHALEXIN 500 MG PO CAPS: 500.0000 mg | ORAL_CAPSULE | Freq: Two times a day (BID) | ORAL | 0 refills | Status: AC

## 2017-05-17 MED ORDER — CEPHALEXIN 500 MG PO CAPS
500.0000 mg | ORAL_CAPSULE | Freq: Once | ORAL | Status: AC
  Administered 2017-05-17: 500 mg via ORAL
  Filled 2017-05-17: qty 1

## 2017-05-17 NOTE — ED Triage Notes (Signed)
Pt BIB EMS for a fall. Pt is from [facility] memory care unit. Hx of dementia. Patient was attempting to pick flowers for his wife and fell into the bushes. Patient walks with a walker. Crawled further into the bushes so he would not be discovered. Pt on eliquis for Afib. Hematoma to L temple noted.

## 2017-05-17 NOTE — ED Notes (Signed)
[facility] called for transport.  

## 2017-05-17 NOTE — Discharge Instructions (Signed)
You may have diarrhea from the antibiotics.  It is very important that you continue to take the antibiotics even if you get diarrhea unless a medical professional tells you that you may stop taking them.  If you stop too early the bacteria you are being treated for will become stronger and you may need different, more powerful antibiotics that have more side effects and worsening diarrhea.  Please stay well hydrated and consider probiotics as they may decrease the severity of your diarrhea.     You have a urinary tract infection.  You have cultures pending.

## 2017-05-17 NOTE — ED Provider Notes (Signed)
[facility]-EMERGENCY DEPT Provider Note   CSN: [id number] Arrival date & time: 05/17/17  2027     History   Chief Complaint Chief Complaint  Patient presents with  . Fall    HPI Cole Wilson is a 83 y.o. male. With a history of dementia, falls, A-fib on Chronic anticoagulation, chronic kidney disease stage III, who presents today for evaluation after a fall. According to EMS he was outside attempting to pick flowers for his wife when he lost his balance and fell. He normally walks with a walker. According to EMS after falling he then attempted to crawl further into the bushes so that no one would know he fell.  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  He denies any physical pain at this time.    HPI  Past Medical History:  Diagnosis Date  . Atrial fibrillation (HCC)   . CKD (chronic kidney disease), stage III (HCC)   . Dementia   . Depression   . Hypotension, unspecified   . Muscle weakness (generalized)   . Pancreatitis     Patient Active Problem List   Diagnosis Date Noted  . Paroxysmal atrial fibrillation (HCC) 03/13/2016  . Pancreatitis 03/11/2016  . Bradycardia 03/11/2016  . Depression 03/11/2016  . Pressure ulcer 11/19/2015  . Sepsis due to urinary tract infection (HCC) 11/18/2015  . Hypotension 11/18/2015  . Anemia 11/18/2015  . Acute encephalopathy 11/18/2015  . CKD (chronic kidney disease), stage III (HCC) 11/18/2015  . Dementia without behavioral disturbance 06/22/2015  . Acute pancreatitis 06/21/2015  . Sepsis (HCC) 06/21/2015  . Transaminitis 06/21/2015  . Alcohol use 06/21/2015  . Leucocytosis 03/20/2012    Past Surgical History:  Procedure Laterality Date  . ANKLE FUSION Right 1970/1990  . APPENDECTOMY    . CHOLECYSTECTOMY N/A 03/16/2016   Procedure: LAPAROSCOPIC CHOLECYSTECTOMY WITH ATTEMPTED INTRAOPERATIVE CHOLANGIOGRAM;  Surgeon: Jimmye Norman, MD;   Location: [facility] OR;  Service: General;  Laterality: N/A;  . KNEE ARTHROSCOPY Left 2002  . TONSILLECTOMY  1952  . TOTAL KNEE ARTHROPLASTY Left 2009       Home Medications    Prior to Admission medications   Medication Sig Start Date End Date Taking? Authorizing Provider  apixaban (ELIQUIS) 2.5 MG TABS tablet Take 1 tablet (2.5 mg total) by mouth 2 (two) times daily. 06/16/16  Yes Lewayne Bunting, MD  Calcium Carb-Cholecalciferol (CALCIUM 600+D) 600-800 MG-UNIT TABS Take 1 tablet by mouth daily.   Yes [provider]  divalproex (DEPAKOTE) 125 MG DR tablet Take 125 mg by mouth daily. 05/08/17  Yes [provider]  donepezil (ARICEPT) 10 MG tablet TAKE 1 TABLET(10 MG) BY MOUTH AT BEDTIME 11/09/16  Yes York Spaniel, MD  ferrous sulfate 325 (65 FE) MG EC tablet Take 325 mg by mouth daily.   Yes [provider]  Glucosamine-Chondroit-Vit C-Mn (GLUCOSAMINE 1500 COMPLEX PO) Take 1 tablet by mouth daily.   Yes [provider]  LORazepam (ATIVAN) 0.5 MG tablet Take 0.5 mg by mouth 2 (two) times daily as needed for anxiety or depression. 05/08/17  Yes [provider]  memantine (NAMENDA) 10 MG tablet TAKE 1 TABLET BY MOUTH TWICE DAILY 12/18/16  Yes York Spaniel, MD  Multiple Vitamin (MULTIVITAMIN) capsule Take 1 capsule by mouth daily.     Yes [provider]  Multiple Vitamins-Minerals (VITEYES AREDS FORMULA) CAPS Take 1 capsule by mouth daily.  Yes [provider]  QUEtiapine (SEROQUEL) 25 MG tablet Take 75 mg by mouth at bedtime. 03/25/17  Yes [provider]  sertraline (ZOLOFT) 25 MG tablet Take 75 mg by mouth daily. 04/16/17  Yes [provider]  tamsulosin (FLOMAX) 0.4 MG CAPS capsule Take 1 capsule (0.4 mg total) by mouth at bedtime. 11/21/15  Yes Calvert Cantor, MD  traZODone (DESYREL) 50 MG tablet Take 50 mg by mouth at bedtime. 05/08/17  Yes [provider]  trimethoprim (TRIMPEX) 100 MG tablet Take  100 mg by mouth at bedtime. 04/10/17  Yes [provider]  cephALEXin (KEFLEX) 500 MG capsule Take 1 capsule (500 mg total) by mouth 2 (two) times daily. 05/17/17 05/22/17  Cristina Gong, PA-C    Family History Family History  Problem Relation Age of Onset  . Cancer Mother        breast, uterine  . Cancer - Lung Father   . Dementia Paternal Grandmother     Social History Social History  Substance Use Topics  . Smoking status: Never Smoker  . Smokeless tobacco: Never Used  . Alcohol use No     Allergies   Patient has no known allergies.   Review of Systems Review of Systems  Unable to perform ROS: Dementia     Physical Exam Updated Vital Signs BP 98/60   Pulse (!) 46   Temp 98.6 F (37 C) (Oral)   Resp 16   SpO2 96%   Physical Exam  Constitutional: He appears well-developed and well-nourished.  HENT:  Head: Normocephalic.  Mouth/Throat: Oropharynx is clear and moist.  Abrasion to left forehead.   Eyes: Pupils are equal, round, and reactive to light. Conjunctivae are normal.  Neck: Normal range of motion. Neck supple. No JVD present.  Cardiovascular: Normal rate, normal heart sounds and intact distal pulses.  An irregularly irregular rhythm present. Exam reveals no gallop and no friction rub.   No murmur heard. Pulses:      Radial pulses are 2+ on the right side, and 2+ on the left side.       Dorsalis pedis pulses are 2+ on the right side, and 2+ on the left side.       Posterior tibial pulses are 2+ on the right side, and 2+ on the left side.  Pulmonary/Chest: Effort normal and breath sounds normal. No stridor. No respiratory distress.  Abdominal: Soft. Normal appearance and bowel sounds are normal. He exhibits no distension. There is no tenderness. There is no rigidity, no guarding and no CVA tenderness.  Musculoskeletal: He exhibits no edema or deformity.  Patient is able to move all extremities.  Strength appears symmetrical bilaterally.    All compartments are easily compressible, no obvious deformities palpated.  Neurological: He is alert.  Neurological exam limited by dementia.   No obvious facial droop, words are not slurred.  He is oriented to person, not place or time.  His thoughts are tangential.  He spontaneously moves all extremities, is able to bear weight on bilateral lower extremities.  5/5 grip strength and dorsi/plantarflexion bilaterally.  Able to lift legs off bed.  No pronator drift.     Skin: Skin is warm and dry.  Psychiatric: He has a normal mood and affect.  Nursing note and vitals reviewed.    ED Treatments / Results  Labs (all labs ordered are listed, but only abnormal results are displayed) Labs Reviewed  URINE CULTURE - Abnormal; Notable for the following:  Result Value   Culture   (*)    Value: <10,000 COLONIES/mL Performed at [facility], [address] [place], [place] [address]    All other components within normal limits  COMPREHENSIVE METABOLIC PANEL - Abnormal; Notable for the following:    Glucose, Bld 101 (*)    BUN 29 (*)    Creatinine, Ser 1.42 (*)    ALT 15 (*)    Alkaline Phosphatase 149 (*)    GFR calc non Af Amer 44 (*)    GFR calc Af Amer 52 (*)    All other components within normal limits  CBC WITH DIFFERENTIAL/PLATELET - Abnormal; Notable for the following:    RBC 3.40 (*)    Hemoglobin 9.9 (*)    HCT 30.8 (*)    All other components within normal limits  URINALYSIS, ROUTINE W REFLEX MICROSCOPIC - Abnormal; Notable for the following:    APPearance HAZY (*)    Leukocytes, UA LARGE (*)    Bacteria, UA MANY (*)    All other components within normal limits    EKG  EKG Interpretation  Date/Time:  Monday May 17 2017 20:50:30 EDT Ventricular Rate:  66 PR Interval:    QRS Duration: 94 QT Interval:  415 QTC Calculation: 435 R Axis:   -23 Text Interpretation:  Sinus arrhythmia Borderline left axis deviation When compared with ECG of 03/16/2016, No  significant change was found Confirmed by Dione Booze [phone]) on 05/17/2017 9:01:03 PM Also confirmed by Dione Booze [phone]), editor Elita Quick [phone])  on 05/18/2017 7:15:23 AM       Radiology Ct Head Wo Contrast  Result Date: 05/17/2017 CLINICAL DATA:  Patient fell today. History of dementia. Hematoma to the left temple. EXAM: CT HEAD WITHOUT CONTRAST CT CERVICAL SPINE WITHOUT CONTRAST TECHNIQUE: Multidetector CT imaging of the head and cervical spine was performed following the standard protocol without intravenous contrast. Multiplanar CT image reconstructions of the cervical spine were also generated. COMPARISON:  CT head 12/29/2015.  MRI brain 08/29/2014 FINDINGS: CT HEAD FINDINGS Brain: Diffuse cerebral atrophy. Ventricular dilatation most likely due to central atrophy. Can't entirely exclude normal pressure hydrocephalus. Appearance is similar to previous studies. Low-attenuation changes in the deep white matter bilaterally consistent with small vessel ischemia. Old lacunar infarcts in the basal ganglia. No mass effect or midline shift. No abnormal extra-axial fluid collections. Gray-white matter junctions are distinct. Basal cisterns are not effaced. No acute intracranial hemorrhage. Vascular: Vascular calcifications in the internal carotid and vertebral arteries. Skull: The calvarium appears intact. Sinuses/Orbits: Paranasal sinuses and mastoid air cells are clear. Other: None. CT CERVICAL SPINE FINDINGS Alignment: There is slight anterior subluxation at C7-T1. This is likely degenerative. Ligamentous injury can have this appearance and is not entirely excluded. Correlation with physical examination is recommended. Normal alignment of the facet joints. C1-2 articulation appears intact. Skull base and vertebrae: Skull base appears intact. No vertebral compression deformities. No focal bone lesion or bone destruction. Degenerative changes in the temporomandibular joints. Congenital nonunion  of the posterior arch of C1. Soft tissues and spinal canal: No prevertebral soft tissue swelling. No paraspinal soft tissue infiltration. No soft tissue mass lesions identified. Disc levels: Degenerative changes throughout the cervical spine with narrowed interspaces and associated endplate hypertrophy. Changes are most prominent at C5-6 and C6-7 levels. Degenerative changes throughout the cervical facet joints. Upper chest: Lung apices are clear. Vascular calcifications in the neck. Other: None. IMPRESSION: 1. No acute intracranial abnormalities. Chronic atrophy and small vessel  ischemic changes. 2. Slight anterior subluxation at C7-T1 is likely degenerative. Degenerative changes throughout the cervical spine. No acute displaced fractures identified. Electronically Signed   By: Burman Nieves M.D.   On: 05/17/2017 21:24   Ct Cervical Spine Wo Contrast  Result Date: 05/17/2017 CLINICAL DATA:  Patient fell today. History of dementia. Hematoma to the left temple. EXAM: CT HEAD WITHOUT CONTRAST CT CERVICAL SPINE WITHOUT CONTRAST TECHNIQUE: Multidetector CT imaging of the head and cervical spine was performed following the standard protocol without intravenous contrast. Multiplanar CT image reconstructions of the cervical spine were also generated. COMPARISON:  CT head 12/29/2015.  MRI brain 08/29/2014 FINDINGS: CT HEAD FINDINGS Brain: Diffuse cerebral atrophy. Ventricular dilatation most likely due to central atrophy. Can't entirely exclude normal pressure hydrocephalus. Appearance is similar to previous studies. Low-attenuation changes in the deep white matter bilaterally consistent with small vessel ischemia. Old lacunar infarcts in the basal ganglia. No mass effect or midline shift. No abnormal extra-axial fluid collections. Gray-white matter junctions are distinct. Basal cisterns are not effaced. No acute intracranial hemorrhage. Vascular: Vascular calcifications in the internal carotid and vertebral  arteries. Skull: The calvarium appears intact. Sinuses/Orbits: Paranasal sinuses and mastoid air cells are clear. Other: None. CT CERVICAL SPINE FINDINGS Alignment: There is slight anterior subluxation at C7-T1. This is likely degenerative. Ligamentous injury can have this appearance and is not entirely excluded. Correlation with physical examination is recommended. Normal alignment of the facet joints. C1-2 articulation appears intact. Skull base and vertebrae: Skull base appears intact. No vertebral compression deformities. No focal bone lesion or bone destruction. Degenerative changes in the temporomandibular joints. Congenital nonunion of the posterior arch of C1. Soft tissues and spinal canal: No prevertebral soft tissue swelling. No paraspinal soft tissue infiltration. No soft tissue mass lesions identified. Disc levels: Degenerative changes throughout the cervical spine with narrowed interspaces and associated endplate hypertrophy. Changes are most prominent at C5-6 and C6-7 levels. Degenerative changes throughout the cervical facet joints. Upper chest: Lung apices are clear. Vascular calcifications in the neck. Other: None. IMPRESSION: 1. No acute intracranial abnormalities. Chronic atrophy and small vessel ischemic changes. 2. Slight anterior subluxation at C7-T1 is likely degenerative. Degenerative changes throughout the cervical spine. No acute displaced fractures identified. Electronically Signed   By: Burman Nieves M.D.   On: 05/17/2017 21:24    Procedures Procedures (including critical care time)  Medications Ordered in ED Medications  cephALEXin (KEFLEX) capsule 500 mg (500 mg Oral Given 05/17/17 2355)     Initial Impression / Assessment and Plan / ED Course  I have reviewed the triage vital signs and the nursing notes.  Pertinent labs & imaging results that were available during my care of the patient were reviewed by me and considered in my medical decision making (see chart for  details).     Cole Wilson presents for evaluation of a fall at his nursing facility.  He is chronically anticoagulated for A-fib.  Labs were obtained and appear consistent with his baseline.  EKG with out acute changes.  CT head and neck obtained  showing changes associated with age and degeneration but with out acute abnormalities.  No injuries other than abrasion to forehead found.  Catheterized urine specimen obtained, Large leukocytes, many bacteria.  Patient does not have any CVA tenderness, is afebrile, not tachycardic.  Does not meet SIRS or sepsis criteria.  He was given a first dose of keflex in the ED and given a rx for keflex for UTI.  Urine  sent for culture.  This patient was seen as a shared visit with Dr. Particia Nearing who evaluated the patent and agreed with my plan.  Discharge back to nursing home.   Final Clinical Impressions(s) / ED Diagnoses   Final diagnoses:  Fall, initial encounter  Contusion of scalp, initial encounter  Abrasion  Cystitis    New Prescriptions Discharge Medication List as of 05/17/2017 10:54 PM    START taking these medications   Details  cephALEXin (KEFLEX) 500 MG capsule Take 1 capsule (500 mg total) by mouth 2 (two) times daily., Starting Mon 05/17/2017, Until Sat 05/22/2017, Print         Cristina Gong, PA-C 05/19/17 1324    Jacalyn Lefevre, MD 05/19/17 [id number]

## 2017-05-17 NOTE — ED Notes (Signed)
Patient transported to CT 

## 2017-05-19 LAB — URINE CULTURE: Culture: <10000 — AB

## 2017-09-01 IMAGING — CT CT ABD-PELV W/ CM
2 of 5 series · 16 of 46 positions shown, 18 images · IV contrast (Omni 300)
Comparison: None.

CLINICAL DATA: Diffuse abdominal pain started at noon.

EXAM:
CT ABDOMEN AND PELVIS WITH CONTRAST
TECHNIQUE: Multidetector CT imaging of the abdomen and pelvis was performed
using the standard protocol following bolus administration of
intravenous contrast.
CONTRAST:  75mL V90CIC-NEE IOPAMIDOL (V90CIC-NEE) INJECTION 61%

[Series 2: a/p w/ 5mm · axial · 0.79mm/px · z∈[+753,+1223]mm · 13 of 106 slices shown, 15 images]
[im 6/106  soft-tissue]
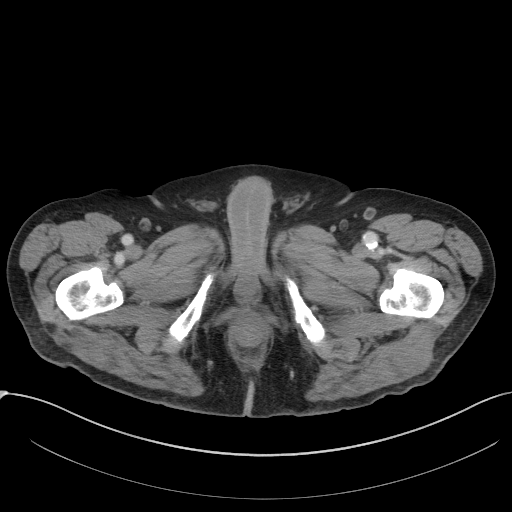
[im 6/106  bone]
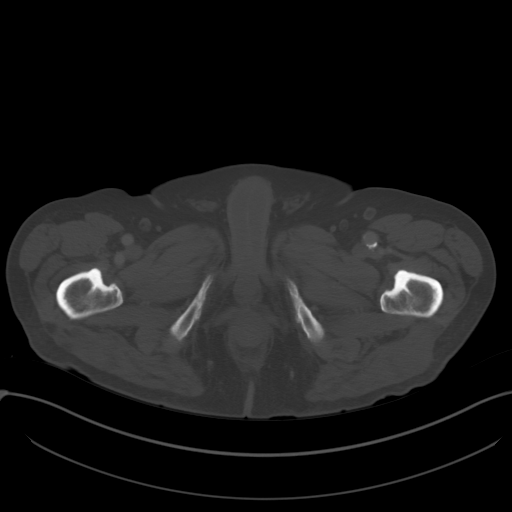
[im 16/106  soft-tissue]
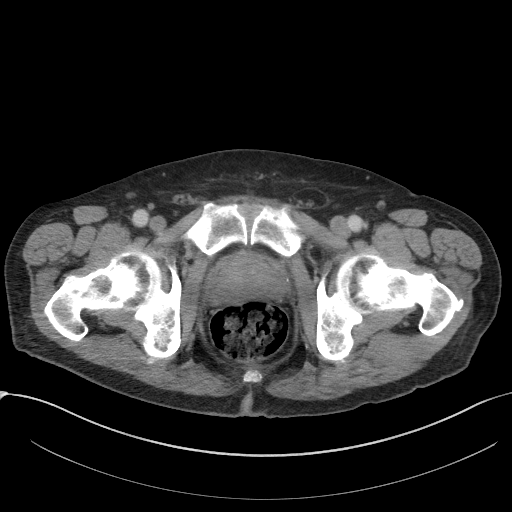
[im 22/106  soft-tissue]
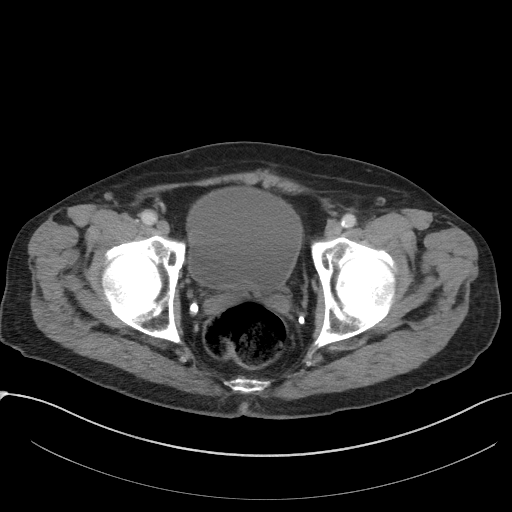
[im 32/106  soft-tissue]
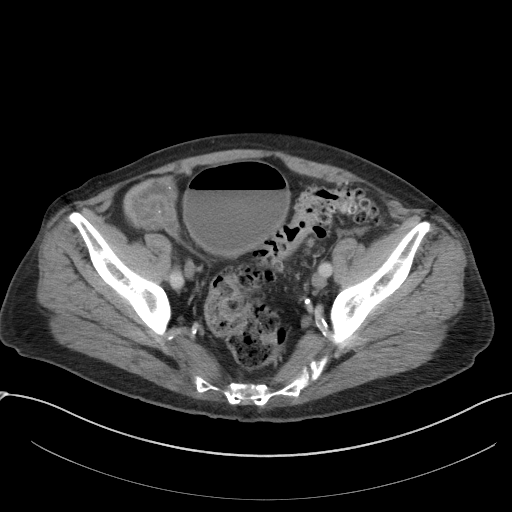
[im 37/106  soft-tissue]
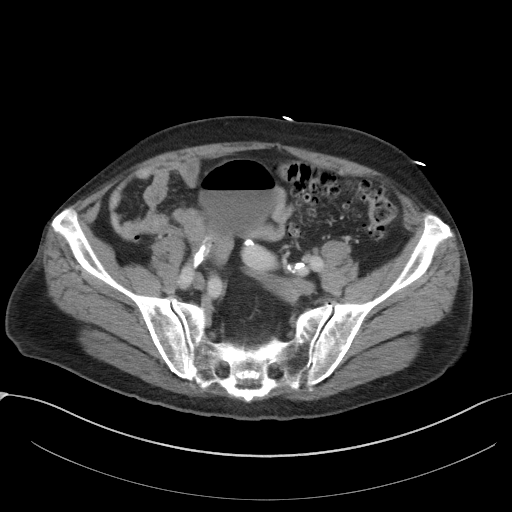
[im 48/106  soft-tissue]
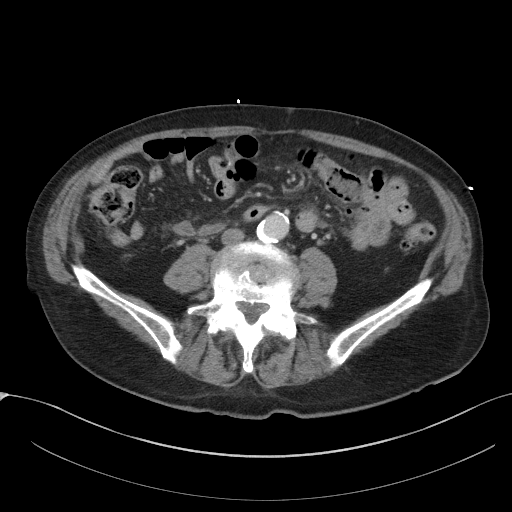
[im 53/106  soft-tissue]
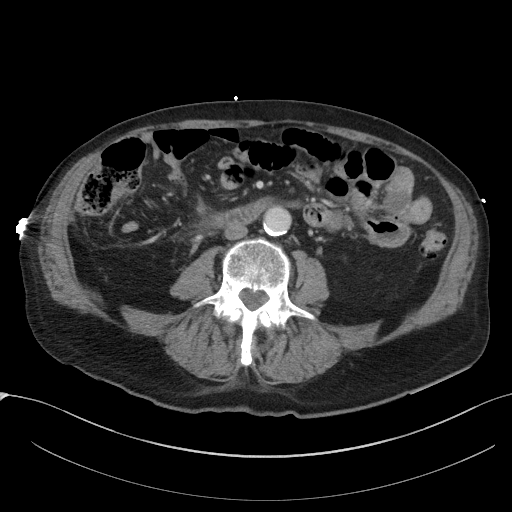
[im 58/106  soft-tissue]
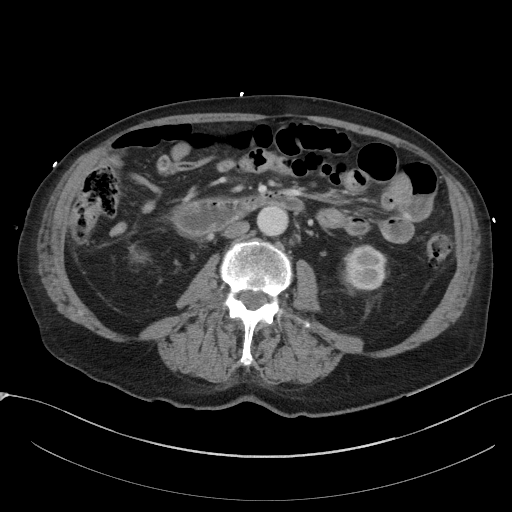
[im 69/106  soft-tissue]
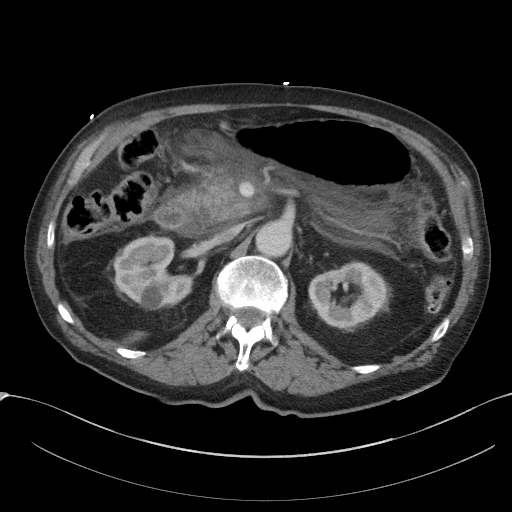
[im 69/106  bone]
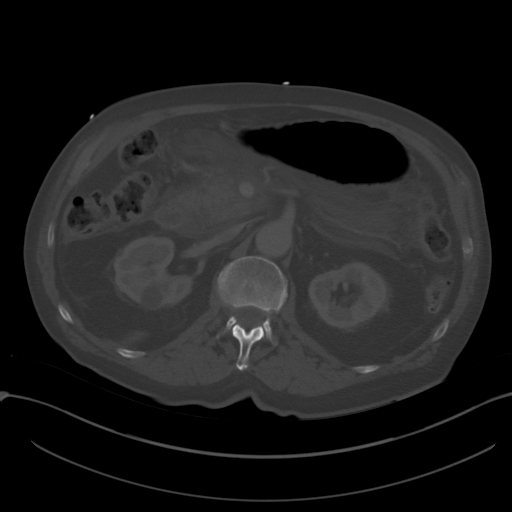
[im 74/106  soft-tissue]
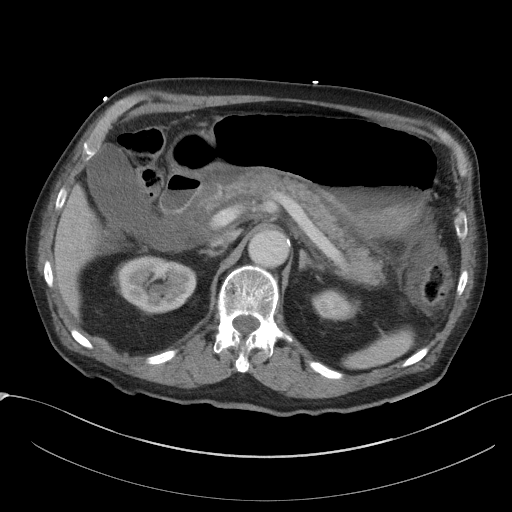
[im 85/106  soft-tissue]
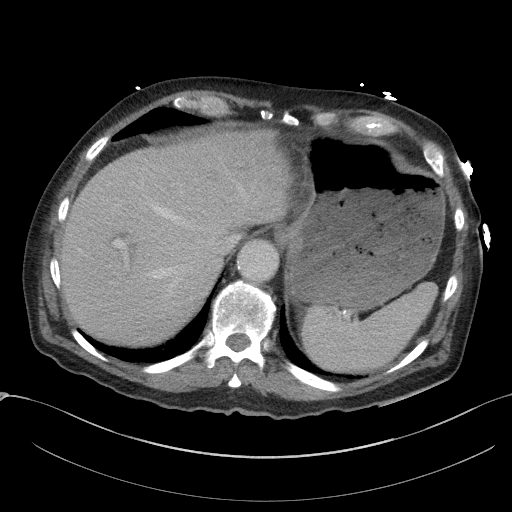
[im 90/106  soft-tissue]
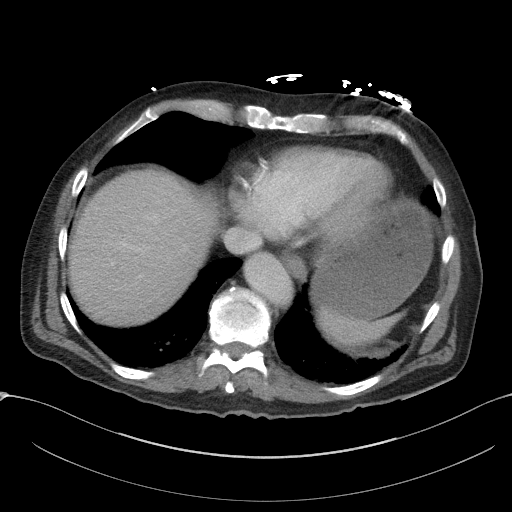
[im 100/106  soft-tissue]
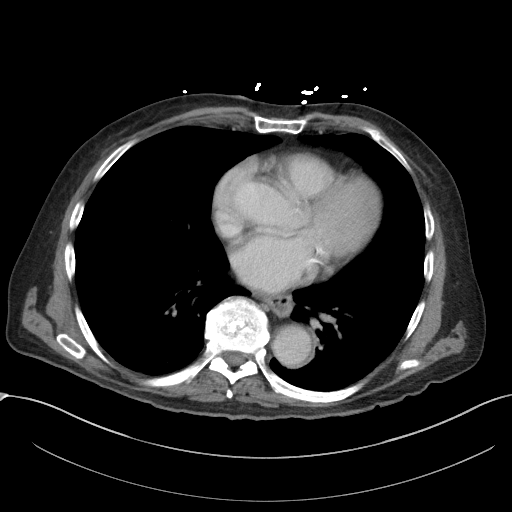

[Series 5: a/p w/ cor · coronal · 0.85mm/px · 3 of 142 slices shown]
[im 48/142  soft-tissue]
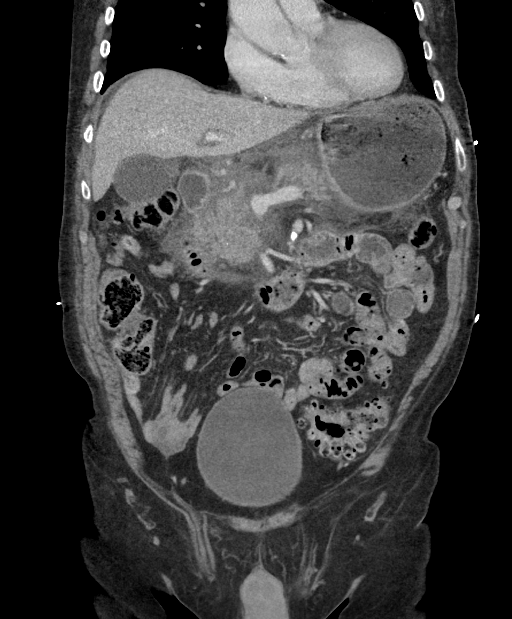
[im 63/142  soft-tissue]
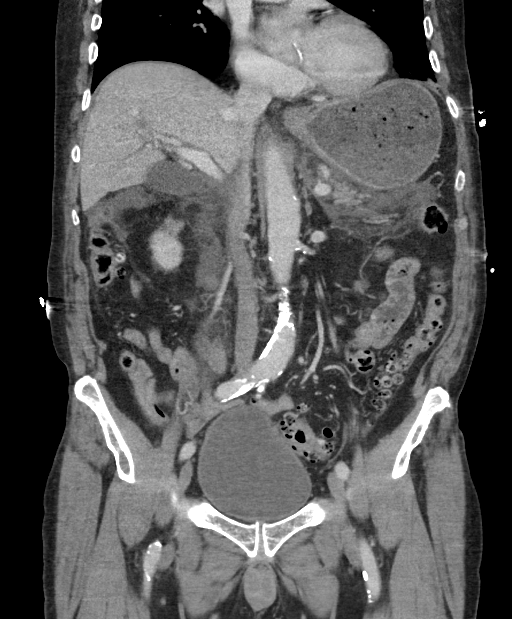
[im 79/142  soft-tissue]
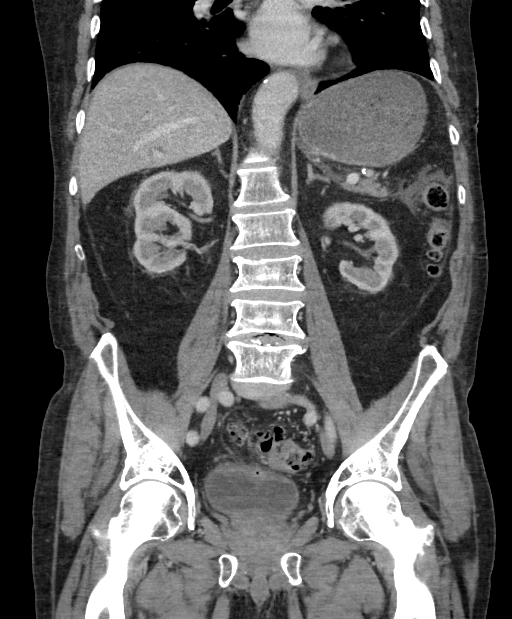

[16 of 46 positions shown; findings below may reference images not displayed]

FINDINGS: Lower chest:  Mild bibasilar atelectasis.

Hepatobiliary: No masses or other significant abnormality.

Pancreas: Severe peripancreatic inflammatory changes and fluid
consistent with acute pancreatitis. Pancreas enhances normally and
homogeneously without areas of pancreatic necrosis.

Spleen: Within normal limits in size and appearance.

Adrenals/Urinary Tract: Normal adrenal glands. Normal kidneys. Air
within the bladder which may be secondary to instrumentation, but in
the absence of instrumentation this can be seen with cystitis.

Stomach/Bowel: No bowel wall thickening or dilatation. No
pneumatosis, pneumoperitoneum or portal venous gas. Normal appendix.
Colonic diverticulosis without evidence of diverticulitis. No pelvic
free fluid.

Vascular/Lymphatic: Abdominal aortic atherosclerosis. Normal caliber
abdominal aorta.

Other: None.

Musculoskeletal: No aggressive lytic or sclerotic osseous lesion. No
acute osseous abnormality. Severe bilateral facet arthropathy at
L4-5 and L5-S1. Degenerative disc disease at L4-5 and L5-S1.
IMPRESSION: 1. Findings consistent with acute pancreatitis. No focal fluid
collection to suggest a pseudocyst or abscess.
2. Air within the bladder which may be secondary to instrumentation,
but in the absence of instrumentation this can be seen with
cystitis.
3. Diverticulosis without evidence of diverticulitis.

## 2017-09-17 DEATH — deceased
